# Patient Record
Sex: Female | Born: 1990 | Race: Black or African American | Hispanic: No | Marital: Single | Smoking: Former smoker
Health system: Southern US, Community
[De-identification: ages and names within clinical notes are randomized; demographics above are authoritative.]

## PROBLEM LIST (undated history)

## (undated) ENCOUNTER — Inpatient Hospital Stay (HOSPITAL_COMMUNITY): Payer: Self-pay

## (undated) ENCOUNTER — Emergency Department (HOSPITAL_COMMUNITY): Admission: EM | Discharge status: Absent without leave | Payer: Self-pay

## (undated) DIAGNOSIS — D573 Sickle-cell trait: Secondary | ICD-10-CM

## (undated) DIAGNOSIS — Z789 Other specified health status: Secondary | ICD-10-CM

## (undated) DIAGNOSIS — J302 Other seasonal allergic rhinitis: Secondary | ICD-10-CM

## (undated) HISTORY — PX: WISDOM TOOTH EXTRACTION: SHX21

---
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
## 2012-04-27 ENCOUNTER — Encounter (HOSPITAL_COMMUNITY): Payer: Self-pay | Admitting: *Deleted

## 2012-04-27 ENCOUNTER — Emergency Department (HOSPITAL_COMMUNITY)
Admission: EM | Admit: 2012-04-27 | Discharge: 2012-04-27 | Disposition: A | Discharge status: Home / Self Care | Payer: Self-pay | Attending: Emergency Medicine | Admitting: Emergency Medicine

## 2012-04-27 DIAGNOSIS — Z3202 Encounter for pregnancy test, result negative: Secondary | ICD-10-CM | POA: Insufficient documentation

## 2012-04-27 DIAGNOSIS — R531 Weakness: Secondary | ICD-10-CM

## 2012-04-27 DIAGNOSIS — F17200 Nicotine dependence, unspecified, uncomplicated: Secondary | ICD-10-CM | POA: Insufficient documentation

## 2012-04-27 DIAGNOSIS — R5381 Other malaise: Secondary | ICD-10-CM | POA: Insufficient documentation

## 2012-04-27 DIAGNOSIS — R42 Dizziness and giddiness: Secondary | ICD-10-CM | POA: Insufficient documentation

## 2012-04-27 HISTORY — DX: Other seasonal allergic rhinitis: J30.2

## 2012-04-27 LAB — COMPREHENSIVE METABOLIC PANEL
Alkaline Phosphatase: 65 U/L (ref 39–117)
CO2: 24 mEq/L (ref 19–32)
GFR calc Af Amer: >90 mL/min (ref >90)
GFR calc non Af Amer: >90 mL/min (ref >90)
Potassium: 3.8 mEq/L (ref 3.5–5.1)
Total Bilirubin: 0.3 mg/dL (ref 0.3–1.2)
Total Protein: 7.2 g/dL (ref 6.0–8.3)

## 2012-04-27 LAB — CBC WITH DIFFERENTIAL/PLATELET
Basophils Relative: 0 % (ref 0–1)
Eosinophils Absolute: 0.2 10*3/uL (ref 0.0–0.7)
Eosinophils Relative: 5 % (ref 0–5)
HCT: 34.3 % — ABNORMAL LOW (ref 36.0–46.0)
Lymphocytes Relative: 30 % (ref 12–46)
Lymphs Abs: 1.4 10*3/uL (ref 0.7–4.0)
MCHC: 37.6 g/dL — ABNORMAL HIGH (ref 30.0–36.0)
Monocytes Absolute: 0.6 10*3/uL (ref 0.1–1.0)
Monocytes Relative: 13 % — ABNORMAL HIGH (ref 3–12)
Neutro Abs: 2.5 10*3/uL (ref 1.7–7.7)
RBC: 4.33 MIL/uL (ref 3.87–5.11)
RDW: 13.2 % (ref 11.5–15.5)
WBC: 4.7 10*3/uL (ref 4.0–10.5)

## 2012-04-27 LAB — POCT PREGNANCY, URINE: Preg Test, Ur: NEGATIVE

## 2012-04-27 LAB — URINALYSIS, ROUTINE W REFLEX MICROSCOPIC
Hgb urine dipstick: NEGATIVE
Leukocytes, UA: NEGATIVE
Specific Gravity, Urine: 1.026 (ref 1.005–1.030)
Urobilinogen, UA: 0.2 mg/dL (ref 0.0–1.0)

## 2012-04-27 NOTE — ED Provider Notes (Signed)
History     CSN: [id number]  Arrival date & time 04/27/12  1103   First MD Initiated Contact with Patient 04/27/12 1212      Chief Complaint  Patient presents with  . Weakness    (Consider location/radiation/quality/duration/timing/severity/associated sxs/prior treatment) HPI 22 y.o. Female complaining of generalized weakness for several weeks slightly worse.  Feels like she is lazier, sleepy, and doesn't do anything.  Works at [employer] but states she feels lightheaded sometimes and thinks it is because she doesn't drink enough fluids.  Last week at work she was hot and sweating and had to fan herself off.  LMP 3/22 sexually active using condoms g0, preg negative here.  No weight change, no history of thyroid problems, no neck pain or swelling.  Denies chest pain, cough, nausea, vomiting or diarrhea.  Periods are not heavy and no hisotry of anemia.    Past Medical History  Diagnosis Date  . Seasonal allergies     History reviewed. No pertinent past surgical history.  No family history on file.  History  Substance Use Topics  . Smoking status: Current Every Day Smoker -- 1.00 packs/day  . Smokeless tobacco: Not on file     Comment: PT smokes 3 blacks a day  . Alcohol Use: No    OB History   Grav Para Term Preterm Abortions TAB SAB Ect Mult Living                  Review of Systems  All other systems reviewed and are negative.    Allergies  Review of patient's allergies indicates no known allergies.  Home Medications  No current outpatient prescriptions on file.  BP 129/73  Pulse 78  Temp(Src) 98.5 F (36.9 C) (Oral)  Resp 18  SpO2 99%  LMP 04/17/2012  Physical Exam  Nursing note and vitals reviewed. Constitutional: She is oriented to person, place, and time. She appears well-developed and well-nourished.  HENT:  Head: Normocephalic and atraumatic.  Right Ear: External ear normal.  Left Ear: External ear normal.  Nose: Nose normal.  Mouth/Throat:  Oropharynx is clear and moist.  Eyes: Conjunctivae and EOM are normal. Pupils are equal, round, and reactive to light.  Neck: Normal range of motion. Neck supple.  Cardiovascular: Normal rate, regular rhythm, normal heart sounds and intact distal pulses.   Pulmonary/Chest: Effort normal and breath sounds normal.  Abdominal: Soft. Bowel sounds are normal.  Musculoskeletal: Normal range of motion.  Neurological: She is alert and oriented to person, place, and time. She has normal reflexes.  Skin: Skin is warm and dry.  Psychiatric: She has a normal mood and affect. Her behavior is normal. Judgment and thought content normal.    ED Course  Procedures (including critical care time)  Labs Reviewed  CBC WITH DIFFERENTIAL - Abnormal; Notable for the following:    HCT 34.3 (*)    MCHC 37.6 (*)    Monocytes Relative 13 (*)    All other components within normal limits  COMPREHENSIVE METABOLIC PANEL  URINALYSIS, ROUTINE W REFLEX MICROSCOPIC  POCT PREGNANCY, URINE   No results found.   No diagnosis found.  Results for orders placed during the hospital encounter of 04/27/12  CBC WITH DIFFERENTIAL      Result Value Range   WBC 4.7  4.0 - 10.5 K/uL   RBC 4.33  3.87 - 5.11 MIL/uL   Hemoglobin 12.9  12.0 - 15.0 g/dL   HCT 40.9 (*) 81.1 - 91.4 %  MCV 79.2  78.0 - 100.0 fL   MCH 29.8  26.0 - 34.0 pg   MCHC 37.6 (*) 30.0 - 36.0 g/dL   RDW 16.1  09.6 - 04.5 %   Platelets 180  150 - 400 K/uL   Neutrophils Relative 52  43 - 77 %   Neutro Abs 2.5  1.7 - 7.7 K/uL   Lymphocytes Relative 30  12 - 46 %   Lymphs Abs 1.4  0.7 - 4.0 K/uL   Monocytes Relative 13 (*) 3 - 12 %   Monocytes Absolute 0.6  0.1 - 1.0 K/uL   Eosinophils Relative 5  0 - 5 %   Eosinophils Absolute 0.2  0.0 - 0.7 K/uL   Basophils Relative 0  0 - 1 %   Basophils Absolute 0.0  0.0 - 0.1 K/uL  COMPREHENSIVE METABOLIC PANEL      Result Value Range   Sodium 139  135 - 145 mEq/L   Potassium 3.8  3.5 - 5.1 mEq/L   Chloride  106  96 - 112 mEq/L   CO2 24  19 - 32 mEq/L   Glucose, Bld 88  70 - 99 mg/dL   BUN 11  6 - 23 mg/dL   Creatinine, Ser 4.09  0.50 - 1.10 mg/dL   Calcium 9.2  8.4 - 81.1 mg/dL   Total Protein 7.2  6.0 - 8.3 g/dL   Albumin 4.0  3.5 - 5.2 g/dL   AST 17  0 - 37 U/L   ALT 11  0 - 35 U/L   Alkaline Phosphatase 65  39 - 117 U/L   Total Bilirubin 0.3  0.3 - 1.2 mg/dL   GFR calc non Af Amer >90  >90 mL/min   GFR calc Af Amer >90  >90 mL/min  URINALYSIS, ROUTINE W REFLEX MICROSCOPIC      Result Value Range   Color, Urine YELLOW  YELLOW   APPearance CLEAR  CLEAR   Specific Gravity, Urine 1.026  1.005 - 1.030   pH 7.5  5.0 - 8.0   Glucose, UA NEGATIVE  NEGATIVE mg/dL   Hgb urine dipstick NEGATIVE  NEGATIVE   Bilirubin Urine NEGATIVE  NEGATIVE   Ketones, ur NEGATIVE  NEGATIVE mg/dL   Protein, ur NEGATIVE  NEGATIVE mg/dL   Urobilinogen, UA 0.2  0.0 - 1.0 mg/dL   Nitrite NEGATIVE  NEGATIVE   Leukocytes, UA NEGATIVE  NEGATIVE  POCT PREGNANCY, URINE      Result Value Range   Preg Test, Ur NEGATIVE  NEGATIVE     MDM  Discussed with patient general weakness.  No source seen with no anemia, not pregnant, electrolytes norma.  Needs follow up for thyroid studies, tobacco counseling given, and advised referral for family planning.  Requests note for work.        Hilario Quarry, MD 04/27/12 1259

## 2012-04-27 NOTE — ED Notes (Signed)
Pt c/o generalized weakness that started a couple weeks ago. Pt denies n/v/d. Pt reports she did have one episode last week where she became very hot and lightheaded, pt denies LOC, sts it only lasted a few minutes. Pt sts she thinks she is dehydrated. Pt reports she hasn't been drinking much water lately. Pt denies pain. Pt reports she just feels weak and sometimes like her legs are going to give out. Pt reports she feels that she has been sleeping more than normal. Pt in nad, ambulated to room with no issues, skin warm and dry, resp e/u.

## 2012-04-27 NOTE — ED Notes (Signed)
Pt only complaint is weakness.  Feels unable to stand for long periods of time.  LMP 3/30.

## 2012-06-29 ENCOUNTER — Emergency Department (HOSPITAL_COMMUNITY)
Admission: EM | Admit: 2012-06-29 | Discharge: 2012-06-29 | Discharge status: Against Medical Advice | Payer: Medicaid Other | Attending: Emergency Medicine | Admitting: Emergency Medicine

## 2012-06-29 ENCOUNTER — Encounter (HOSPITAL_COMMUNITY): Payer: Self-pay | Admitting: *Deleted

## 2012-06-29 DIAGNOSIS — J309 Allergic rhinitis, unspecified: Secondary | ICD-10-CM | POA: Insufficient documentation

## 2012-06-29 DIAGNOSIS — N898 Other specified noninflammatory disorders of vagina: Secondary | ICD-10-CM | POA: Insufficient documentation

## 2012-06-29 DIAGNOSIS — F17200 Nicotine dependence, unspecified, uncomplicated: Secondary | ICD-10-CM | POA: Insufficient documentation

## 2012-06-29 LAB — URINALYSIS, ROUTINE W REFLEX MICROSCOPIC
Glucose, UA: NEGATIVE mg/dL
Protein, ur: NEGATIVE mg/dL
pH: 8 (ref 5.0–8.0)

## 2012-06-29 LAB — PREGNANCY, URINE: Preg Test, Ur: NEGATIVE

## 2012-06-29 NOTE — ED Notes (Signed)
Pt did not answer x 1 

## 2012-06-29 NOTE — ED Notes (Signed)
Pt did not answer x 3 

## 2012-06-29 NOTE — ED Notes (Signed)
Pt did not answer x 2 

## 2012-06-29 NOTE — ED Notes (Signed)
PT is here with vaginal discharge and cream colored vaginal discharge.  LMP finished on MOnday

## 2012-11-05 ENCOUNTER — Emergency Department (HOSPITAL_COMMUNITY)
Admission: EM | Admit: 2012-11-05 | Discharge: 2012-11-05 | Disposition: A | Discharge status: Home / Self Care | Payer: Medicaid Other | Attending: Emergency Medicine | Admitting: Emergency Medicine

## 2012-11-05 ENCOUNTER — Encounter (HOSPITAL_COMMUNITY): Payer: Self-pay | Admitting: Emergency Medicine

## 2012-11-05 DIAGNOSIS — Z3490 Encounter for supervision of normal pregnancy, unspecified, unspecified trimester: Secondary | ICD-10-CM

## 2012-11-05 DIAGNOSIS — N898 Other specified noninflammatory disorders of vagina: Secondary | ICD-10-CM | POA: Insufficient documentation

## 2012-11-05 DIAGNOSIS — O99330 Smoking (tobacco) complicating pregnancy, unspecified trimester: Secondary | ICD-10-CM | POA: Insufficient documentation

## 2012-11-05 DIAGNOSIS — O9989 Other specified diseases and conditions complicating pregnancy, childbirth and the puerperium: Secondary | ICD-10-CM | POA: Insufficient documentation

## 2012-11-05 LAB — URINE MICROSCOPIC-ADD ON

## 2012-11-05 LAB — WET PREP, GENITAL
Trich, Wet Prep: NONE SEEN
Yeast Wet Prep HPF POC: NONE SEEN

## 2012-11-05 LAB — URINALYSIS, ROUTINE W REFLEX MICROSCOPIC
Hgb urine dipstick: NEGATIVE
Specific Gravity, Urine: 1.031 — ABNORMAL HIGH (ref 1.005–1.030)
Urobilinogen, UA: 1 mg/dL (ref 0.0–1.0)
pH: 7.5 (ref 5.0–8.0)

## 2012-11-05 LAB — HIV ANTIBODY (ROUTINE TESTING W REFLEX): HIV: NONREACTIVE

## 2012-11-05 LAB — POCT PREGNANCY, URINE: Preg Test, Ur: POSITIVE — AB

## 2012-11-05 MED ORDER — LIDOCAINE HCL (PF) 1 % IJ SOLN
INTRAMUSCULAR | Status: AC
  Administered 2012-11-05: 09:00:00
  Filled 2012-11-05: qty 5

## 2012-11-05 MED ORDER — AZITHROMYCIN 250 MG PO TABS
1000.0000 mg | ORAL_TABLET | Freq: Once | ORAL | Status: AC
  Administered 2012-11-05: 1000 mg via ORAL
  Filled 2012-11-05: qty 4

## 2012-11-05 MED ORDER — PRENATAL COMPLETE 14-0.4 MG PO TABS: 1.0000 | ORAL_TABLET | ORAL | Status: DC

## 2012-11-05 MED ORDER — CEFTRIAXONE SODIUM 250 MG IJ SOLR
250.0000 mg | Freq: Once | INTRAMUSCULAR | Status: AC
  Administered 2012-11-05: 250 mg via INTRAMUSCULAR
  Filled 2012-11-05: qty 250

## 2012-11-05 NOTE — ED Notes (Signed)
Urine sample collected and is by the beside.

## 2012-11-05 NOTE — ED Provider Notes (Signed)
CSN: [id number]     Arrival date & time 11/05/12  8295 History   First MD Initiated Contact with Patient 11/05/12 0813     Chief Complaint  Patient presents with  . Vaginal Discharge   (Consider location/radiation/quality/duration/timing/severity/associated sxs/prior Treatment) HPI Complains of vaginal discharge for one month. Denies other symptoms. No pain anywhere. Treated herself with Monistat 7 she completed 5 of 7 doses without change in symptoms. Last normal menstrual period 09/26/2012. No other associated symptoms. Nothing makes symptoms better or worse. Past Medical History  Diagnosis Date  . Seasonal allergies    medical history negative past surgical history negative History reviewed. No pertinent past surgical history. History reviewed. No pertinent family history. History  Substance Use Topics  . Smoking status: Current Every Day Smoker -- 1.00 packs/day  . Smokeless tobacco: Not on file     Comment: PT smokes 3 blacks a day  . Alcohol Use: No   ex-smoker quit in August 2014 no alcohol no drugs OB History   Grav Para Term Preterm Abortions TAB SAB Ect Mult Living                 Review of Systems  Constitutional: Negative.   Gastrointestinal: Negative.   Genitourinary: Positive for vaginal discharge.  Allergic/Immunologic: Negative.     Allergies  Review of patient's allergies indicates no known allergies.  Home Medications   Current Outpatient Rx  Name  Route  Sig  Dispense  Refill  . Phenyleph-CPM-DM-Aspirin (ALKA-SELTZER PLUS COLD & COUGH PO)   Oral   Take 2 tablets by mouth daily as needed (cold symptoms).          BP 113/73  Pulse 93  Temp(Src) 97.5 F (36.4 C) (Oral)  Resp 20  Ht 5\' 7"  (1.702 m)  Wt 128 lb (58.06 kg)  BMI 20.04 kg/m2  SpO2 100%  LMP 09/26/2012 Physical Exam  Nursing note and vitals reviewed. Constitutional: She appears well-developed and well-nourished.  HENT:  Head: Normocephalic and atraumatic.  Eyes: Conjunctivae  are normal. Pupils are equal, round, and reactive to light.  Neck: Neck supple. No tracheal deviation present. No thyromegaly present.  Cardiovascular: Normal rate and regular rhythm.   No murmur heard. Pulmonary/Chest: Effort normal and breath sounds normal.  Abdominal: Soft. Bowel sounds are normal. She exhibits no distension. There is no tenderness.  Genitourinary:  No external lesion. Copious mucousy whitish vaginal discharge. Positive cervical motion tenderness no adnexal masses or tenderness  Musculoskeletal: Normal range of motion. She exhibits no edema and no tenderness.  Neurological: She is alert. Coordination normal.  Skin: Skin is warm and dry. No rash noted.  Psychiatric: She has a normal mood and affect.    ED Course  Procedures (including critical care time) Labs Review Labs Reviewed  GC/CHLAMYDIA PROBE AMP  WET PREP, GENITAL  RPR  HIV ANTIBODY (ROUTINE TESTING)  URINALYSIS, ROUTINE W REFLEX MICROSCOPIC   Imaging Review No results found.  EKG Interpretation   None      Results for orders placed during the hospital encounter of 11/05/12  WET PREP, GENITAL      Result Value Range   Yeast Wet Prep HPF POC NONE SEEN  NONE SEEN   Trich, Wet Prep NONE SEEN  NONE SEEN   Clue Cells Wet Prep HPF POC FEW (*) NONE SEEN   WBC, Wet Prep HPF POC NONE SEEN  NONE SEEN  URINALYSIS, ROUTINE W REFLEX MICROSCOPIC      Result Value Range  Color, Urine YELLOW  YELLOW   APPearance CLOUDY (*) CLEAR   Specific Gravity, Urine 1.031 (*) 1.005 - 1.030   pH 7.5  5.0 - 8.0   Glucose, UA NEGATIVE  NEGATIVE mg/dL   Hgb urine dipstick NEGATIVE  NEGATIVE   Bilirubin Urine SMALL (*) NEGATIVE   Ketones, ur 15 (*) NEGATIVE mg/dL   Protein, ur NEGATIVE  NEGATIVE mg/dL   Urobilinogen, UA 1.0  0.0 - 1.0 mg/dL   Nitrite NEGATIVE  NEGATIVE   Leukocytes, UA SMALL (*) NEGATIVE  URINE MICROSCOPIC-ADD ON      Result Value Range   Squamous Epithelial / LPF MANY (*) RARE   WBC, UA 3-6  <3  WBC/hpf   RBC / HPF 0-2  <3 RBC/hpf   Bacteria, UA FEW (*) RARE   Urine-Other MUCOUS PRESENT    POCT PREGNANCY, URINE      Result Value Range   Preg Test, Ur POSITIVE (*) NEGATIVE   No results found.   MDM  No diagnosis found. Will treat empirically for STDs. Plan safe sex encouraged. RPR, HIV test pending. Referral health Department for prenatal care. Prescription prenatal vitamins. Diagnosis #1 pregnancy #2 cervicitis    Doug Sou, MD 11/05/12 1002

## 2012-11-05 NOTE — ED Notes (Signed)
Patient presents to the ED with c/o vaginal discharge with an odor for approximately 1 and 1/2 months used OTC yeast medications without relief.

## 2012-11-05 NOTE — ED Notes (Signed)
Pending discharge. After Rocephin injection.

## 2012-11-06 LAB — URINE CULTURE: Colony Count: 3000

## 2012-11-07 LAB — GC/CHLAMYDIA PROBE AMP: GC Probe RNA: NEGATIVE

## 2012-12-22 ENCOUNTER — Inpatient Hospital Stay (HOSPITAL_COMMUNITY)
Admission: AD | Admit: 2012-12-22 | Discharge: 2012-12-23 | Disposition: A | Discharge status: Home / Self Care | Payer: Medicaid Other | Source: Ambulatory Visit | Attending: Obstetrics and Gynecology | Admitting: Obstetrics and Gynecology

## 2012-12-22 ENCOUNTER — Encounter (HOSPITAL_COMMUNITY): Payer: Self-pay | Admitting: *Deleted

## 2012-12-22 DIAGNOSIS — R0981 Nasal congestion: Secondary | ICD-10-CM

## 2012-12-22 DIAGNOSIS — J3489 Other specified disorders of nose and nasal sinuses: Secondary | ICD-10-CM | POA: Insufficient documentation

## 2012-12-22 DIAGNOSIS — O99891 Other specified diseases and conditions complicating pregnancy: Secondary | ICD-10-CM

## 2012-12-22 DIAGNOSIS — R51 Headache: Secondary | ICD-10-CM | POA: Insufficient documentation

## 2012-12-22 DIAGNOSIS — O26891 Other specified pregnancy related conditions, first trimester: Secondary | ICD-10-CM

## 2012-12-22 NOTE — MAU Note (Signed)
Pt LMP 09/25/2012, +UPT at [facility], concerned because she is having headaches everyday. Denies bleeding.

## 2012-12-23 ENCOUNTER — Encounter (HOSPITAL_COMMUNITY): Payer: Self-pay | Admitting: *Deleted

## 2012-12-23 DIAGNOSIS — O9989 Other specified diseases and conditions complicating pregnancy, childbirth and the puerperium: Secondary | ICD-10-CM

## 2012-12-23 MED ORDER — BUTALBITAL-APAP-CAFFEINE 50-325-40 MG PO TABS: 1.0000 | ORAL_TABLET | Freq: Four times a day (QID) | ORAL | Status: DC | PRN

## 2012-12-23 MED ORDER — PSEUDOEPHEDRINE HCL 30 MG PO TABS: 30.0000 mg | ORAL_TABLET | ORAL | Status: DC | PRN

## 2012-12-23 MED ORDER — IBUPROFEN 600 MG PO TABS: 600.0000 mg | ORAL_TABLET | Freq: Four times a day (QID) | ORAL | Status: DC | PRN

## 2012-12-23 NOTE — MAU Provider Note (Signed)
Chief Complaint: Headache   First Provider Initiated Contact with Patient 12/23/12 0120      SUBJECTIVE HPI: Cheyenne Phillips is a 22 y.o. G1P0 at [redacted]w[redacted]d by LMP who presents to MAU with daily HA's since early pregnancy. None now. Some relief w/ Tylenol, but stopped taking mid-November due to pt's and family's concerns about medication use in pregnancy. Pain is temporal, 6-8/10, constant when it occurs. Last several hours at a time. Also C/O constant congestion that she attributes to allergies, but has not experienced any relief from multiple allergy meds recommended/Dx'd by provider at [facility] (unsure of names. 2-3 oral meds. One nasal med.)   Unable to identify triggers. No Hx of similar HA's prior to pregnancy. HA's not progressive. Has not tried anything else for HA. Pt is poor historian.   Plans to go to [facility] when pregnancy Medicaid active.    Past Medical History  Diagnosis Date  . Seasonal allergies    OB History  Gravida Para Term Preterm AB SAB TAB Ectopic Multiple Living  1             # Outcome Date GA Lbr Len/2nd Weight Sex Delivery Anes PTL Lv  1 CUR              Past Surgical History  Procedure Laterality Date  . Wisdom tooth extraction     History   Social History  . Marital Status: Single    Spouse Name: N/A    Number of Children: N/A  . Years of Education: N/A   Occupational History  . Not on file.   Social History Main Topics  . Smoking status: Former Smoker -- 1.00 packs/day  . Smokeless tobacco: Not on file     Comment: PT smokes 3 blacks a day  . Alcohol Use: No  . Drug Use: No  . Sexual Activity: Yes   Other Topics Concern  . Not on file   Social History Narrative  . No narrative on file   No current facility-administered medications on file prior to encounter.   No current outpatient prescriptions on file prior to encounter.   No Known Allergies  ROS: Pos for HA, nasal congestion. Minimal rhinorrhea. Neg for vision  changes, for fever, chills, neck stiffness, difficulties w/ speech or gait, sneezing, itchy eyes/nose, photophobia, phonophobia, N/V/D/C, abd pain, vaginal bleeding, vaginal discharge.   OBJECTIVE Blood pressure 123/65, pulse 74, temperature 97.7 F (36.5 C), temperature source Oral, resp. rate 16, height 5\' 6"  (1.676 m), weight 61.78 kg (136 lb 3.2 oz), last menstrual period 09/25/2012. GENERAL: Well-developed, well-nourished female in no acute distress.  HEENT: Normocephalic. Sinuses NT. Sound congested. No rhinorrhea. Normal neck ROM. Mild tenderness.  HEART: normal rate RESP: normal effort ABDOMEN: Soft, non-tender. Gravid, S=D.  EXTREMITIES: Nontender, no edema NEURO: Alert and oriented FHR 140 by doppler.  LAB RESULTS NA  IMAGING NA  MAU COURSE No red flags for emergent condition.   ASSESSMENT 1. Headache in pregnancy, antepartum, first trimester   2. Nasal congestion related to pregnancy    Suspect HA is congestion or tension-related. Congestion may be hormonal from pregnancy.   PLAN Discharge home in stable condition. HA red flags reviewed. Discussed comfort measures, medications in pregnancy. Try treating congestion and tension. May use Fioricet sparingly.      Follow-up Information   Follow up with [facility]. (Start prenatal care)    Specialty:  Obstetrics and Gynecology   Contact information:  [address] [place] [address] [phone]      Follow up with [facility]. (As needed in emergencies)    Contact information:   [address] [id number] [place] [address] [phone]      Call [facility]arefoot, Rubbie Battiest, NP. (for headache work-up)    Specialty:  Internal Medicine   Contact information:   [address] [place] [address] [phone]        Medication List         acetaminophen 325 MG tablet  Commonly known  as:  TYLENOL  Take 325-650 mg by mouth every 6 (six) hours as needed for headache.     butalbital-acetaminophen-caffeine 50-325-40 MG per tablet  Commonly known as:  FIORICET  Take 1-2 tablets by mouth every 6 (six) hours as needed for headache.     ibuprofen 600 MG tablet  Commonly known as:  ADVIL,MOTRIN  Take 1 tablet (600 mg total) by mouth every 6 (six) hours as needed. Do not take after [redacted] weeks gestation.     prenatal multivitamin Tabs tablet  Take 1 tablet by mouth daily at 12 noon.     pseudoephedrine 30 MG tablet  Commonly known as:  SUDAFED  Take 1 tablet (30 mg total) by mouth every 4 (four) hours as needed for congestion.       [place], CNM 12/23/2012  1:55 AM

## 2012-12-26 NOTE — MAU Provider Note (Signed)
Attestation of Attending Supervision of Advanced Practitioner (CNM/NP): Evaluation and management procedures were performed by the Advanced Practitioner under my supervision and collaboration.  I have reviewed the Advanced Practitioner's note and chart, and I agree with the management and plan.  Nysa Sarin 12/26/2012 11:26 AM   

## 2013-01-31 ENCOUNTER — Encounter: Payer: Self-pay | Admitting: Advanced Practice Midwife

## 2013-01-31 ENCOUNTER — Other Ambulatory Visit: Payer: Self-pay | Admitting: Advanced Practice Midwife

## 2013-01-31 ENCOUNTER — Ambulatory Visit (INDEPENDENT_AMBULATORY_CARE_PROVIDER_SITE_OTHER): Payer: Medicaid Other | Admitting: Advanced Practice Midwife

## 2013-01-31 VITALS — BP 110/64 | Temp 97.7°F | Ht 67.0 in | Wt 142.0 lb

## 2013-01-31 DIAGNOSIS — Z3400 Encounter for supervision of normal first pregnancy, unspecified trimester: Secondary | ICD-10-CM

## 2013-01-31 DIAGNOSIS — Z3201 Encounter for pregnancy test, result positive: Secondary | ICD-10-CM

## 2013-01-31 LAB — POCT URINALYSIS DIPSTICK
BILIRUBIN UA: NEGATIVE
Glucose, UA: NEGATIVE
KETONES UA: NEGATIVE
Nitrite, UA: NEGATIVE
RBC UA: NEGATIVE
Spec Grav, UA: 1.015
Urobilinogen, UA: NEGATIVE
pH, UA: 7

## 2013-01-31 LAB — OB RESULTS CONSOLE GC/CHLAMYDIA
Chlamydia: NEGATIVE
Gonorrhea: NEGATIVE

## 2013-01-31 LAB — POCT URINE PREGNANCY: Preg Test, Ur: POSITIVE

## 2013-01-31 NOTE — Progress Notes (Signed)
HR - 90 Pt in office for New OB visit, reports yellow discharge with irritation

## 2013-01-31 NOTE — Progress Notes (Signed)
   Subjective:    Cheyenne Phillips is a G1P0 5257w2d being seen today for her first obstetrical visit.  Her obstetrical history is significant for nothing. Patient does intend to breast feed. Pregnancy history fully reviewed.  Patient reports no complaints.  Uncertain of LMP reports it was an abnormal period. She feels she is further along than what her LMP would establish.  Patient works at [employer] FOC is involved, it is his 3rd child, their 1st together. Denies risk of genetic disease or spinal defects. Family involved and supportive.  Filed Vitals:   01/31/13 1115 01/31/13 1119  BP: 110/64   Temp: 97.7 F (36.5 C)   Height:  5\' 7"  (1.702 m)  Weight: 142 lb (64.411 kg)     HISTORY: OB History  Gravida Para Term Preterm AB SAB TAB Ectopic Multiple Living  1             # Outcome Date GA Lbr Len/2nd Weight Sex Delivery Anes PTL Lv  1 CUR              Past Medical History  Diagnosis Date  . Seasonal allergies    Past Surgical History  Procedure Laterality Date  . Wisdom tooth extraction     Family History  Problem Relation Age of Onset  . Hypertension Maternal Grandmother   . Hyperlipidemia Maternal Grandmother   . Arthritis Maternal Grandmother   . Asthma Maternal Grandmother      Exam    Uterus:     Pelvic Exam:    Perineum: No Hemorrhoids   Vulva: normal   Vagina:  normal mucosa   pH: 4.5   Cervix: absent   Adnexa: normal adnexa   Bony Pelvis: gynecoid  System: Breast:  normal appearance, no masses or tenderness   Skin: normal coloration and turgor, no rashes    Neurologic: oriented, normal   Extremities: normal strength, tone, and muscle mass   HEENT PERRLA   Mouth/Teeth mucous membranes moist, pharynx normal without lesions   Neck supple   Cardiovascular: regular rate and rhythm   Respiratory:  appears well, vitals normal, no respiratory distress, acyanotic, normal RR, ear and throat exam is normal, neck free of mass or lymphadenopathy,  chest clear, no wheezing, crepitations, rhonchi, normal symmetric air entry   Abdomen: soft, non-tender; bowel sounds normal; no masses,  no organomegaly   Urinary: urethral meatus normal      Assessment:    Pregnancy: G1P0 There are no active problems to display for this patient. US for dating pending      Plan:     Initial labs drawn. Prenatal vitamins. Problem list reviewed and updated. Genetic Screening discussed Quad Screen: once EDD established by US.Marland Kitchen.  Ultrasound discussed; fetal survey: requested.  Follow up in 4 weeks. 80% of 60 min visit spent on counseling and coordination of care.     Dorise Gangi 01/31/2013

## 2013-02-01 LAB — OBSTETRIC PANEL
Antibody Screen: NEGATIVE
Basophils Absolute: 0 10*3/uL (ref 0.0–0.1)
Basophils Relative: 0 % (ref 0–1)
Eosinophils Absolute: 0.3 10*3/uL (ref 0.0–0.7)
Eosinophils Relative: 3 % (ref 0–5)
HCT: 33.7 % — ABNORMAL LOW (ref 36.0–46.0)
HEMOGLOBIN: 11.6 g/dL — AB (ref 12.0–15.0)
HEP B S AG: NEGATIVE
LYMPHS PCT: 12 % (ref 12–46)
Lymphs Abs: 1.2 10*3/uL (ref 0.7–4.0)
MCH: 29.8 pg (ref 26.0–34.0)
MCHC: 34.4 g/dL (ref 30.0–36.0)
MCV: 86.6 fL (ref 78.0–100.0)
Monocytes Absolute: 1 10*3/uL (ref 0.1–1.0)
Monocytes Relative: 10 % (ref 3–12)
Neutro Abs: 7.6 10*3/uL (ref 1.7–7.7)
Neutrophils Relative %: 75 % (ref 43–77)
PLATELETS: 169 10*3/uL (ref 150–400)
RBC: 3.89 MIL/uL (ref 3.87–5.11)
RDW: 13.7 % (ref 11.5–15.5)
Rh Type: POSITIVE
Rubella: 1.52 Index — ABNORMAL HIGH (ref <0.90)
WBC: 10.1 10*3/uL (ref 4.0–10.5)

## 2013-02-01 LAB — GC/CHLAMYDIA PROBE AMP
CT Probe RNA: NEGATIVE
GC Probe RNA: NEGATIVE

## 2013-02-01 LAB — VITAMIN D 25 HYDROXY (VIT D DEFICIENCY, FRACTURES): Vit D, 25-Hydroxy: 14 ng/mL — ABNORMAL LOW (ref 30–89)

## 2013-02-01 LAB — HEMOGLOBINOPATHY EVALUATION
HEMOGLOBIN OTHER: 35.5 % — AB
HGB S QUANTITAION: 0 %
Hgb A2 Quant: 2.8 % (ref 2.2–3.2)
Hgb A: 61.7 % — ABNORMAL LOW (ref 96.8–97.8)
Hgb F Quant: 0 % (ref 0.0–2.0)

## 2013-02-01 LAB — CULTURE, OB URINE
COLONY COUNT: NO GROWTH
Organism ID, Bacteria: NO GROWTH

## 2013-02-01 LAB — VARICELLA ZOSTER ANTIBODY, IGG: VARICELLA IGG: 432.1 {index} — AB (ref <135.00)

## 2013-02-01 LAB — WET PREP BY MOLECULAR PROBE
Candida species: POSITIVE — AB
Gardnerella vaginalis: POSITIVE — AB
Trichomonas vaginosis: NEGATIVE

## 2013-02-01 LAB — TSH: TSH: 1.451 u[IU]/mL (ref 0.350–4.500)

## 2013-02-01 LAB — HIV ANTIBODY (ROUTINE TESTING W REFLEX): HIV: NONREACTIVE

## 2013-02-03 ENCOUNTER — Other Ambulatory Visit: Payer: Self-pay | Admitting: Advanced Practice Midwife

## 2013-02-03 DIAGNOSIS — B9689 Other specified bacterial agents as the cause of diseases classified elsewhere: Secondary | ICD-10-CM

## 2013-02-03 DIAGNOSIS — N760 Acute vaginitis: Principal | ICD-10-CM

## 2013-02-03 DIAGNOSIS — B379 Candidiasis, unspecified: Secondary | ICD-10-CM

## 2013-02-03 LAB — HGB ELECTROPHORESIS REFLEXED REPORT
HEMOGLOBIN A - HGBRFX: 56.9 % — AB (ref >96.0)
HEMOGLOBIN A2 - HGBRFX: 3 % (ref 1.8–3.5)
HEMOGLOBIN ELECT C: 40.1 % — AB
Hemoglobin F - HGBRFX: 0 % (ref <2.0)
Sickle Solubility Test - HGBRFX: NEGATIVE

## 2013-02-03 MED ORDER — FLUCONAZOLE 150 MG PO TABS: 150.0000 mg | ORAL_TABLET | Freq: Once | ORAL | Status: DC

## 2013-02-03 MED ORDER — METRONIDAZOLE 500 MG PO TABS: 500.0000 mg | ORAL_TABLET | Freq: Two times a day (BID) | ORAL | Status: DC

## 2013-02-06 ENCOUNTER — Encounter: Payer: Medicaid Other | Admitting: Family Medicine

## 2013-02-07 ENCOUNTER — Encounter: Payer: Self-pay | Admitting: Obstetrics & Gynecology

## 2013-02-07 ENCOUNTER — Ambulatory Visit (INDEPENDENT_AMBULATORY_CARE_PROVIDER_SITE_OTHER): Payer: Medicaid Other

## 2013-02-07 ENCOUNTER — Other Ambulatory Visit: Payer: Self-pay | Admitting: *Deleted

## 2013-02-07 DIAGNOSIS — Z1389 Encounter for screening for other disorder: Secondary | ICD-10-CM

## 2013-02-07 LAB — US OB DETAIL + 14 WK

## 2013-02-28 ENCOUNTER — Ambulatory Visit (INDEPENDENT_AMBULATORY_CARE_PROVIDER_SITE_OTHER): Payer: Medicaid Other | Admitting: Obstetrics

## 2013-02-28 ENCOUNTER — Encounter: Payer: Self-pay | Admitting: Obstetrics

## 2013-02-28 VITALS — BP 119/70 | Temp 98.3°F | Wt 144.0 lb

## 2013-02-28 DIAGNOSIS — Z3400 Encounter for supervision of normal first pregnancy, unspecified trimester: Secondary | ICD-10-CM

## 2013-02-28 MED ORDER — CITRANATAL 90 DHA 90-1 & 300 MG PO MISC: 1.0000 | Freq: Every day | ORAL | Status: DC

## 2013-02-28 MED ORDER — VITAMIN D 600 IU CAPSULE SWOG S0812: 600.0000 [IU] | ORAL_CAPSULE | Freq: Every day | ORAL | Status: DC

## 2013-02-28 NOTE — Progress Notes (Signed)
Pulse 91 Pt is doing well. Pt needs lab results reviewed, Hgb and Vit D. Pt is also needing a Rx for prenatal vitamins.  CitraNatal 90 and Vit D 600mg  sent to pharmacy today.

## 2013-03-14 ENCOUNTER — Other Ambulatory Visit: Payer: Medicaid Other

## 2013-03-14 ENCOUNTER — Encounter: Payer: Medicaid Other | Admitting: Obstetrics

## 2013-03-21 ENCOUNTER — Other Ambulatory Visit: Payer: Medicaid Other

## 2013-03-21 ENCOUNTER — Encounter: Payer: Medicaid Other | Admitting: Obstetrics

## 2013-04-11 ENCOUNTER — Ambulatory Visit (INDEPENDENT_AMBULATORY_CARE_PROVIDER_SITE_OTHER): Payer: Medicaid Other | Admitting: Obstetrics

## 2013-04-11 ENCOUNTER — Other Ambulatory Visit: Payer: Medicaid Other

## 2013-04-11 VITALS — BP 123/72 | Temp 98.3°F | Wt 150.0 lb

## 2013-04-11 DIAGNOSIS — Z3400 Encounter for supervision of normal first pregnancy, unspecified trimester: Secondary | ICD-10-CM

## 2013-04-11 LAB — POCT URINALYSIS DIPSTICK
Bilirubin, UA: NEGATIVE
Blood, UA: NEGATIVE
GLUCOSE UA: NEGATIVE
Ketones, UA: NEGATIVE
NITRITE UA: NEGATIVE
Spec Grav, UA: 1.015
UROBILINOGEN UA: NEGATIVE
pH, UA: 6

## 2013-04-11 LAB — CBC
HCT: 35.1 % — ABNORMAL LOW (ref 36.0–46.0)
Hemoglobin: 12.3 g/dL (ref 12.0–15.0)
MCH: 29.9 pg (ref 26.0–34.0)
MCHC: 35 g/dL (ref 30.0–36.0)
MCV: 85.2 fL (ref 78.0–100.0)
PLATELETS: 166 10*3/uL (ref 150–400)
RBC: 4.12 MIL/uL (ref 3.87–5.11)
RDW: 14.6 % (ref 11.5–15.5)
WBC: 13.1 10*3/uL — ABNORMAL HIGH (ref 4.0–10.5)

## 2013-04-11 NOTE — Progress Notes (Signed)
Pulse 86 Pt is having some increase in lower pelvic pressure.  Pt also states that she is having some d/c. Pt denies irritation and odor. Pt is also having her 2GTT at today's visit.

## 2013-04-12 ENCOUNTER — Encounter: Payer: Self-pay | Admitting: Obstetrics

## 2013-04-12 LAB — GLUCOSE TOLERANCE, 2 HOURS W/ 1HR
GLUCOSE, FASTING: 47 mg/dL — AB (ref 70–99)
GLUCOSE: 99 mg/dL (ref 70–170)
Glucose, 2 hour: 87 mg/dL (ref 70–139)

## 2013-04-12 LAB — RPR

## 2013-04-12 LAB — HIV ANTIBODY (ROUTINE TESTING W REFLEX): HIV: NONREACTIVE

## 2013-04-20 DIAGNOSIS — Z3400 Encounter for supervision of normal first pregnancy, unspecified trimester: Secondary | ICD-10-CM

## 2013-04-25 ENCOUNTER — Encounter: Payer: Medicaid Other | Admitting: Obstetrics

## 2013-04-28 ENCOUNTER — Inpatient Hospital Stay (HOSPITAL_COMMUNITY)
Admission: AD | Admit: 2013-04-28 | Discharge: 2013-04-28 | Disposition: A | Discharge status: Home / Self Care | Payer: Medicaid Other | Source: Ambulatory Visit | Attending: Obstetrics | Admitting: Obstetrics

## 2013-04-28 ENCOUNTER — Encounter (HOSPITAL_COMMUNITY): Payer: Self-pay

## 2013-04-28 DIAGNOSIS — N898 Other specified noninflammatory disorders of vagina: Secondary | ICD-10-CM | POA: Insufficient documentation

## 2013-04-28 DIAGNOSIS — O9989 Other specified diseases and conditions complicating pregnancy, childbirth and the puerperium: Principal | ICD-10-CM

## 2013-04-28 DIAGNOSIS — O99891 Other specified diseases and conditions complicating pregnancy: Secondary | ICD-10-CM | POA: Insufficient documentation

## 2013-04-28 LAB — URINALYSIS, ROUTINE W REFLEX MICROSCOPIC
Bilirubin Urine: NEGATIVE
Glucose, UA: NEGATIVE mg/dL
Ketones, ur: NEGATIVE mg/dL
NITRITE: NEGATIVE
PROTEIN: NEGATIVE mg/dL
Specific Gravity, Urine: 1.02 (ref 1.005–1.030)
UROBILINOGEN UA: 0.2 mg/dL (ref 0.0–1.0)
pH: 7.5 (ref 5.0–8.0)

## 2013-04-28 LAB — URINE MICROSCOPIC-ADD ON

## 2013-04-28 LAB — POCT FERN TEST: POCT Fern Test: NEGATIVE

## 2013-04-28 MED ORDER — FLUCONAZOLE 150 MG PO TABS
150.0000 mg | ORAL_TABLET | Freq: Once | ORAL | Status: AC
  Administered 2013-04-28: 150 mg via ORAL
  Filled 2013-04-28: qty 1

## 2013-04-28 NOTE — MAU Note (Signed)
Patient states she has had a vaginal discharge for about 5 days that makes her panties wet, not wearing a pad at this time. States she has had mild discomfort on and off for 1 1/2 weeks but no pain at this time. Denies bleeding and reports good fetal movement.

## 2013-04-28 NOTE — MAU Provider Note (Signed)
Cheyenne Phillips is a G1P0 @ 283w1d gestation who presents to the MAU with vaginal discharge for the past week. She states her mother told her to come and make she she was not ruptured.  SSE thick white cottage cheese like discharge. No pooling. RN to call Dr. Clearance CootsHarper with results and discuss plan of care.  Patient stable for discharge without further screening at this time.

## 2013-05-01 ENCOUNTER — Encounter: Payer: Medicaid Other | Admitting: Obstetrics

## 2013-05-04 ENCOUNTER — Ambulatory Visit (INDEPENDENT_AMBULATORY_CARE_PROVIDER_SITE_OTHER): Payer: Medicaid Other | Admitting: Obstetrics

## 2013-05-04 ENCOUNTER — Encounter: Payer: Self-pay | Admitting: Obstetrics

## 2013-05-04 VITALS — BP 116/72 | Temp 98.3°F | Wt 155.0 lb

## 2013-05-04 DIAGNOSIS — Z3400 Encounter for supervision of normal first pregnancy, unspecified trimester: Secondary | ICD-10-CM

## 2013-05-04 LAB — POCT URINALYSIS DIPSTICK
Bilirubin, UA: NEGATIVE
Glucose, UA: NEGATIVE
KETONES UA: NEGATIVE
Leukocytes, UA: NEGATIVE
Nitrite, UA: NEGATIVE
Protein, UA: NEGATIVE
SPEC GRAV UA: 1.01
Urobilinogen, UA: NEGATIVE
pH, UA: 8

## 2013-05-04 NOTE — Progress Notes (Signed)
Subjective:    Cheyenne Phillips is a 23 y.o. female being seen today for her obstetrical visit. She is at 653w0d gestation. Patient reports backache, fatigue, nausea and occasional contractions. Patient states she is having lower abdominal and pelvic pain and pressure. Patient states she gets sharp pains in her upper abdomen. Patient states she had some pain in her left breast Monday. Patient states she needs a refill on her Prenatal Vitamin. Patient states she carries the sickle cell trait. Fetal movement: normal.  Menstrual History: OB History   Grav Para Term Preterm Abortions TAB SAB Ect Mult Living   1                Patient's last menstrual period was 09/25/2012.    Menstrual History: OB History   Grav Para Term Preterm Abortions TAB SAB Ect Mult Living   1                Patient's last menstrual period was 09/25/2012.    Past Medical History  Diagnosis Date  . Seasonal allergies     Past Surgical History  Procedure Laterality Date  . Wisdom tooth extraction       (Not in a hospital admission) Allergies  Allergen Reactions  . Other     seasonal    History  Substance Use Topics  . Smoking status: Former Smoker -- 1.00 packs/day  . Smokeless tobacco: Never Used     Comment: PT smokes 3 blacks a day  . Alcohol Use: No    Family History  Problem Relation Age of Onset  . Hypertension Maternal Grandmother   . Hyperlipidemia Maternal Grandmother   . Arthritis Maternal Grandmother   . Asthma Maternal Grandmother      Review of Systems Constitutional: negative for anorexia Gastrointestinal: negative for abdominal pain Genitourinary:negative for vaginal discharge Musculoskeletal:negative for back pain Behavioral/Psych: negative for depression and tobacco use   Objective:    LMP 09/25/2012 FHT: 150 BPM  Uterine Size: size equals dates  Presentations: cephalic  Pelvic Exam:              Dilation: not examined       Effacement:  n/a   Station:  n/a    Consistency: n/a            Position: n/a     Orders Placed This Encounter  Procedures  . Strep B DNA probe  . POCT urinalysis dipstick    Assessment:    Pregnancy 37 and 0/7 weeks   Plan:   Plans for delivery: Vaginal anticipated; labs reviewed; problem list updated Counseling: Consent signed. Infant feeding: plans to breastfeed. Cigarette smoking: quit -date unknown. L&D discussion: symptoms of labor, discussed when to call, discussed what number to call, anesthetic/analgesic options reviewed and delivering clinician:  plans Physician. Postpartum supports and preparation: circumcision discussed and contraception plans discussed. No orders of the defined types were placed in this encounter.   Current Outpatient Prescriptions on File Prior to Visit  Medication Sig Dispense Refill  . calcium-vitamin D (OSCAL WITH D) 500-200 MG-UNIT per tablet Take 1 tablet by mouth daily.      . Prenatal Vit-Fe Fumarate-FA (PRENATAL MULTIVITAMIN) TABS tablet Take 1 tablet by mouth daily at 12 noon.       No current facility-administered medications on file prior to visit.   Follow up in 1 Week.

## 2013-05-06 LAB — STREP B DNA PROBE: STREP GROUP B AG: NEGATIVE

## 2013-05-11 ENCOUNTER — Encounter: Payer: Medicaid Other | Admitting: Obstetrics

## 2013-05-12 ENCOUNTER — Encounter: Payer: Medicaid Other | Admitting: Obstetrics

## 2013-05-17 ENCOUNTER — Ambulatory Visit (INDEPENDENT_AMBULATORY_CARE_PROVIDER_SITE_OTHER): Payer: Medicaid Other | Admitting: Obstetrics

## 2013-05-17 ENCOUNTER — Encounter: Payer: Self-pay | Admitting: Obstetrics

## 2013-05-17 VITALS — BP 113/73 | HR 96 | Temp 97.9°F | Wt 159.0 lb

## 2013-05-17 DIAGNOSIS — Z3400 Encounter for supervision of normal first pregnancy, unspecified trimester: Secondary | ICD-10-CM

## 2013-05-17 LAB — POCT URINALYSIS DIPSTICK
BILIRUBIN UA: NEGATIVE
GLUCOSE UA: NEGATIVE
KETONES UA: NEGATIVE
LEUKOCYTES UA: NEGATIVE
NITRITE UA: NEGATIVE
Protein, UA: NEGATIVE
RBC UA: NEGATIVE
Spec Grav, UA: 1.015
Urobilinogen, UA: NEGATIVE
pH, UA: 6

## 2013-05-17 NOTE — Progress Notes (Signed)
Subjective:    Cheyenne Phillips is a 23 y.o. female being seen today for her obstetrical visit. She is at 6234w6d gestation. Patient reports no complaints. Fetal movement: normal.  Problem List Items Addressed This Visit   None    Visit Diagnoses   Supervision of normal first pregnancy    -  Primary    Relevant Orders       POCT urinalysis dipstick (Completed)      There are no active problems to display for this patient.   Objective:    BP 113/73  Pulse 96  Temp(Src) 97.9 F (36.6 C)  Wt 159 lb (72.122 kg)  LMP 09/25/2012 FHT: 150 BPM  Uterine Size: size equals dates  Presentations: unsure  Pelvic Exam: Deferred                         Assessment:    Pregnancy @ 1934w6d weeks   Plan:   Plans for delivery: Vaginal anticipated; labs reviewed; problem list updated Counseling: Consent signed. Infant feeding: plans to breastfeed. Cigarette smoking: Smokes 3 / day. L&D discussion: symptoms of labor, discussed when to call, discussed what number to call, anesthetic/analgesic options reviewed and delivering clinician:  plans Physician. Postpartum supports and preparation: circumcision discussed and contraception plans discussed.  Follow up in 1 Week.

## 2013-05-21 ENCOUNTER — Inpatient Hospital Stay (HOSPITAL_COMMUNITY): Admission: AD | Admit: 2013-05-21 | Payer: Medicaid Other | Source: Ambulatory Visit | Admitting: Obstetrics

## 2013-05-26 ENCOUNTER — Encounter: Payer: Medicaid Other | Admitting: Obstetrics & Gynecology

## 2013-05-29 ENCOUNTER — Ambulatory Visit (INDEPENDENT_AMBULATORY_CARE_PROVIDER_SITE_OTHER): Payer: Medicaid Other | Admitting: Obstetrics

## 2013-05-29 VITALS — BP 123/70 | HR 85 | Temp 98.3°F | Wt 160.0 lb

## 2013-05-29 DIAGNOSIS — Z3400 Encounter for supervision of normal first pregnancy, unspecified trimester: Secondary | ICD-10-CM

## 2013-05-29 DIAGNOSIS — O480 Post-term pregnancy: Secondary | ICD-10-CM

## 2013-05-30 ENCOUNTER — Inpatient Hospital Stay (HOSPITAL_COMMUNITY): Payer: Medicaid Other | Admitting: Anesthesiology

## 2013-05-30 ENCOUNTER — Encounter (HOSPITAL_COMMUNITY): Payer: Self-pay

## 2013-05-30 ENCOUNTER — Inpatient Hospital Stay (HOSPITAL_COMMUNITY)
Admission: RE | Admit: 2013-05-30 | Discharge: 2013-06-03 | DRG: 766 | Disposition: A | Discharge status: Home / Self Care | Payer: Medicaid Other | Source: Ambulatory Visit | Attending: Obstetrics | Admitting: Obstetrics

## 2013-05-30 ENCOUNTER — Encounter: Payer: Self-pay | Admitting: Obstetrics

## 2013-05-30 ENCOUNTER — Encounter (HOSPITAL_COMMUNITY): Payer: Medicaid Other | Admitting: Anesthesiology

## 2013-05-30 DIAGNOSIS — Z87891 Personal history of nicotine dependence: Secondary | ICD-10-CM

## 2013-05-30 DIAGNOSIS — O480 Post-term pregnancy: Principal | ICD-10-CM | POA: Diagnosis present

## 2013-05-30 DIAGNOSIS — Z98891 History of uterine scar from previous surgery: Secondary | ICD-10-CM

## 2013-05-30 DIAGNOSIS — D582 Other hemoglobinopathies: Secondary | ICD-10-CM

## 2013-05-30 LAB — POCT URINALYSIS DIPSTICK
Blood, UA: NEGATIVE
GLUCOSE UA: NEGATIVE
Ketones, UA: NEGATIVE
NITRITE UA: NEGATIVE
Protein, UA: NEGATIVE
Spec Grav, UA: 1.015
pH, UA: 5

## 2013-05-30 LAB — CBC
HEMATOCRIT: 32.6 % — AB (ref 36.0–46.0)
HEMOGLOBIN: 12 g/dL (ref 12.0–15.0)
MCH: 29.7 pg (ref 26.0–34.0)
MCHC: 36.8 g/dL — AB (ref 30.0–36.0)
MCV: 80.7 fL (ref 78.0–100.0)
Platelets: 147 10*3/uL — ABNORMAL LOW (ref 150–400)
RBC: 4.04 MIL/uL (ref 3.87–5.11)
RDW: 14.1 % (ref 11.5–15.5)
WBC: 8.3 10*3/uL (ref 4.0–10.5)

## 2013-05-30 LAB — RPR

## 2013-05-30 MED ORDER — FENTANYL 2.5 MCG/ML BUPIVACAINE 1/10 % EPIDURAL INFUSION (WH - ANES)
14.0000 mL/h | INTRAMUSCULAR | Status: DC | PRN
  Administered 2013-05-30: 14 mL/h via EPIDURAL

## 2013-05-30 MED ORDER — TERBUTALINE SULFATE 1 MG/ML IJ SOLN: 0.2500 mg | Freq: Once | INTRAMUSCULAR | Status: AC | PRN

## 2013-05-30 MED ORDER — ONDANSETRON HCL 4 MG/2ML IJ SOLN: 4.0000 mg | Freq: Four times a day (QID) | INTRAMUSCULAR | Status: DC | PRN

## 2013-05-30 MED ORDER — LIDOCAINE HCL (PF) 1 % IJ SOLN
INTRAMUSCULAR | Status: DC | PRN
  Administered 2013-05-30 (×2): 5 mL

## 2013-05-30 MED ORDER — EPHEDRINE 5 MG/ML INJ: 10.0000 mg | INTRAVENOUS | Status: DC | PRN

## 2013-05-30 MED ORDER — PHENYLEPHRINE 40 MCG/ML (10ML) SYRINGE FOR IV PUSH (FOR BLOOD PRESSURE SUPPORT): 80.0000 ug | PREFILLED_SYRINGE | INTRAVENOUS | Status: DC | PRN

## 2013-05-30 MED ORDER — PHENYLEPHRINE 40 MCG/ML (10ML) SYRINGE FOR IV PUSH (FOR BLOOD PRESSURE SUPPORT)
PREFILLED_SYRINGE | INTRAVENOUS | Status: AC
  Filled 2013-05-30: qty 10

## 2013-05-30 MED ORDER — OXYCODONE-ACETAMINOPHEN 5-325 MG PO TABS: 1.0000 | ORAL_TABLET | ORAL | Status: DC | PRN

## 2013-05-30 MED ORDER — LACTATED RINGERS IV SOLN
500.0000 mL | INTRAVENOUS | Status: DC | PRN
  Administered 2013-05-30: 500 mL via INTRAVENOUS
  Administered 2013-05-30: 1000 mL via INTRAVENOUS

## 2013-05-30 MED ORDER — IBUPROFEN 600 MG PO TABS: 600.0000 mg | ORAL_TABLET | Freq: Four times a day (QID) | ORAL | Status: DC | PRN

## 2013-05-30 MED ORDER — EPHEDRINE 5 MG/ML INJ
INTRAVENOUS | Status: AC
  Filled 2013-05-30: qty 4

## 2013-05-30 MED ORDER — OXYTOCIN BOLUS FROM INFUSION: 500.0000 mL | INTRAVENOUS | Status: DC

## 2013-05-30 MED ORDER — ACETAMINOPHEN 325 MG PO TABS: 650.0000 mg | ORAL_TABLET | ORAL | Status: DC | PRN

## 2013-05-30 MED ORDER — LIDOCAINE HCL (PF) 1 % IJ SOLN: 30.0000 mL | INTRAMUSCULAR | Status: DC | PRN

## 2013-05-30 MED ORDER — DIPHENHYDRAMINE HCL 50 MG/ML IJ SOLN: 12.5000 mg | INTRAMUSCULAR | Status: DC | PRN

## 2013-05-30 MED ORDER — LACTATED RINGERS IV SOLN
500.0000 mL | Freq: Once | INTRAVENOUS | Status: AC
Start: 2013-05-30 — End: 2013-05-30
  Administered 2013-05-30: 800 mL via INTRAVENOUS

## 2013-05-30 MED ORDER — CITRIC ACID-SODIUM CITRATE 334-500 MG/5ML PO SOLN
30.0000 mL | ORAL | Status: DC | PRN
  Administered 2013-05-31: 30 mL via ORAL
  Filled 2013-05-30: qty 15

## 2013-05-30 MED ORDER — LACTATED RINGERS IV SOLN
INTRAVENOUS | Status: DC
  Administered 2013-05-30 – 2013-05-31 (×5): via INTRAVENOUS

## 2013-05-30 MED ORDER — OXYTOCIN 40 UNITS IN LACTATED RINGERS INFUSION - SIMPLE MED
1.0000 m[IU]/min | INTRAVENOUS | Status: DC
  Administered 2013-05-30: 2 m[IU]/min via INTRAVENOUS
  Filled 2013-05-30: qty 1000

## 2013-05-30 MED ORDER — MISOPROSTOL 25 MCG QUARTER TABLET
25.0000 ug | ORAL_TABLET | ORAL | Status: DC | PRN
  Administered 2013-05-30: 25 ug via VAGINAL
  Filled 2013-05-30: qty 0.25

## 2013-05-30 MED ORDER — FENTANYL 2.5 MCG/ML BUPIVACAINE 1/10 % EPIDURAL INFUSION (WH - ANES)
INTRAMUSCULAR | Status: AC
  Filled 2013-05-30: qty 125

## 2013-05-30 MED ORDER — OXYTOCIN 40 UNITS IN LACTATED RINGERS INFUSION - SIMPLE MED: 62.5000 mL/h | INTRAVENOUS | Status: DC

## 2013-05-30 NOTE — Progress Notes (Signed)
Cheyenne Phillips is a 23 y.o. G1P0 at 6858w5d by LMP admitted for induction of labor due to Post dates. Due date 05-25-13.  Subjective:   Objective: BP 119/63  Pulse 85  Temp(Src) 98.1 F (36.7 C) (Oral)  Resp 18  Ht 5\' 7"  (1.702 m)  Wt 160 lb (72.576 kg)  BMI 25.05 kg/m2  LMP 09/25/2012      FHT:  FHR: 135 bpm, variability: moderate,  accelerations:  Present,  decelerations:  Present variable UC:   regular, every 2-5 minutes SVE:   Dilation: 3.5 Effacement (%): 70 Station: -2;-3 Exam by:: Paul Dykesina Feir RN  Labs: Lab Results  Component Value Date   WBC 8.3 05/30/2013   HGB 12.0 05/30/2013   HCT 32.6* 05/30/2013   MCV 80.7 05/30/2013   PLT 147* 05/30/2013    Assessment / Plan: Induction of labor due to postdates,  progressing well on pitocin  Labor: Early labor Preeclampsia:  n/a Fetal Wellbeing:  Category I Pain Control:  Epidural I/D:  n/a Anticipated MOD:  NSVD  Brock Badharles A Harper 05/30/2013, 8:06 PM

## 2013-05-30 NOTE — Progress Notes (Signed)
Alma P Sakamoto is a 23 y.o. G1P0 at 7520w5d by ultrasound admitted for induction of labor due to Post dates. Due date 05/25/2013.  Subjective: Patient feeling mild contraction 1/10 in pain. Doing well.  Objective: BP 94/52  Pulse 76  Temp(Src) 98.4 F (36.9 C) (Oral)  Resp 18  Ht 5\' 7"  (1.702 m)  Wt 160 lb (72.576 kg)  BMI 25.05 kg/m2  LMP 09/25/2012      FHT:  FHR: 150 bpm, variability: moderate,  accelerations:  Present,  decelerations:  Absent  Periods of late d decelerations, improved w/ IV hydration and position change. + scalp stimulation. UC:   irregular, every 1.5-5 minutes SVE:   Dilation: 1 Effacement (%): Thick Station: -3 Exam by:: Kirstie PeriAmy Wrenn, CNM  Contraction stress test prior to insertion. 10 min w/ 3 contractions w/ out deceleration, Cat 1 tracing. Foley bulb inserted w/ out difficulty using speculum and stilette. Once cervical bulb in cervical os, manually palpated further insertion w/ catheter for safe insertion. Uterine balloon 40 cc, remained in w/ mild traction. External vaginal balloon 30 cc. Patient tolerated procedure well. Moderate amount of bloody show.   Labs: Lab Results  Component Value Date   WBC 8.3 05/30/2013   HGB 12.0 05/30/2013   HCT 32.6* 05/30/2013   MCV 80.7 05/30/2013   PLT 147* 05/30/2013    Assessment / Plan: Early labor, Cervical Ripening balloon inserted  Labor: Early labor, Cat 2 tracing w/ cytotec. Cytotec d/c'd foley bulb inserted. Preeclampsia:  NA Fetal Wellbeing:  Category I Pain Control:  Labor support without medications I/D:  n/a Anticipated MOD:  NSVD  Taavi Hoose Dessa PhiHowell Shaughnessy Gethers 05/30/2013, 2:23 PM

## 2013-05-30 NOTE — Progress Notes (Addendum)
[facility] UC pattern 2-5 minutes, pitocin at 2 units, about to move patient to exaggerated sims. Clearance CootsHarper reported he reviewed strip, told about lates and variables. Dr. Clearance CootsHarper agrees with exaggerated sims position. Clearance CootsHarper also ordered to keep pitocin at 2-3 millunits.

## 2013-05-30 NOTE — Anesthesia Preprocedure Evaluation (Signed)

## 2013-05-30 NOTE — Progress Notes (Signed)
Cheyenne Phillips is a 23 y.o. G1P0 at 3322w5d by ultrasound admitted for induction of labor due to Post dates. Due date 05/25/2013  Subjective: Patient resting denies painful contractions  Objective: BP 94/52  Pulse 76  Temp(Src) 98.4 F (36.9 C) (Oral)  Resp 18  Ht 5\' 7"  (1.702 m)  Wt 160 lb (72.576 kg)  BMI 25.05 kg/m2  LMP 09/25/2012      FHT:  FHR: 155 bpm, variability: moderate,  accelerations:  Present,  decelerations:  Present occasional declerations, unable to determine early vs late, toco needs adjusted. UC:   irregular, every 2-4 minutes SVE:   Dilation: 1 Effacement (%): Thick Exam by:: Gitty Osterlund wrenn cnm  Labs: Lab Results  Component Value Date   WBC 8.3 05/30/2013   HGB 12.0 05/30/2013   HCT 32.6* 05/30/2013   MCV 80.7 05/30/2013   PLT 147* 05/30/2013    Assessment / Plan: Induction of labor due to postterm,  progressing well on pitocin Cat 2 tracing  IV hydration Change maternal position. No further induction process at this time until Cat 1 FHR. Will consider foley bulb for IOL.   Labor: not in active labor Preeclampsia:  NA Fetal Wellbeing:  Category II Pain Control:  Labor support without medications I/D:  n/a Anticipated MOD:  NSVD  Johnmatthew Solorio Dessa PhiHowell Aveen Stansel 05/30/2013, 12:40 PM

## 2013-05-30 NOTE — Anesthesia Procedure Notes (Signed)
Epidural Patient location during procedure: OB Start time: 05/30/2013 4:42 PM  Staffing Anesthesiologist: Brayton CavesJACKSON, Mazikeen Hehn Performed by: anesthesiologist   Preanesthetic Checklist Completed: patient identified, site marked, surgical consent, pre-op evaluation, timeout performed, IV checked, risks and benefits discussed and monitors and equipment checked  Epidural Patient position: sitting Prep: site prepped and draped and DuraPrep Patient monitoring: continuous pulse ox and blood pressure Approach: midline Location: L3-L4 Injection technique: LOR air  Needle:  Needle type: Tuohy  Needle gauge: 17 G Needle length: 9 cm and 9 Needle insertion depth: 5 cm cm Catheter type: closed end flexible Catheter size: 19 Gauge Catheter at skin depth: 10 cm Test dose: negative  Assessment Events: blood not aspirated, injection not painful, no injection resistance, negative IV test and no paresthesia  Additional Notes Patient identified.  Risk benefits discussed including failed block, incomplete pain control, headache, nerve damage, paralysis, blood pressure changes, nausea, vomiting, reactions to medication both toxic or allergic, and postpartum back pain.  Patient expressed understanding and wished to proceed.  All questions were answered.  Sterile technique used throughout procedure and epidural site dressed with sterile barrier dressing. No paresthesia or other complications noted.The patient did not experience any signs of intravascular injection such as tinnitus or metallic taste in mouth nor signs of intrathecal spread such as rapid motor block. Please see nursing notes for vital signs.

## 2013-05-30 NOTE — Progress Notes (Signed)
Provider reviewed strip. Told Dr. Clearance CootsHarper about continuing lates despite position change and bolus. Reported SVE results. Dr. Clearance CootsHarper will come in to rupture and place [facility].

## 2013-05-30 NOTE — Progress Notes (Signed)
Subjective:    Cheyenne Phillips is a 23 y.o. female being seen today for her obstetrical visit. She is at 846w5d gestation. Patient reports no complaints. Fetal movement: normal.  Problem List Items Addressed This Visit   None    Visit Diagnoses   Supervision of normal first pregnancy    -  Primary    Relevant Orders       POCT urinalysis dipstick      Patient Active Problem List   Diagnosis Date Noted  . Abnormal hemoglobin 05/30/2013  . Post term pregnancy over 40 weeks 05/30/2013    Objective:    BP 123/70  Pulse 85  Temp(Src) 98.3 F (36.8 C)  Wt 160 lb (72.576 kg)  LMP 09/25/2012 FHT:  150 BPM  Uterine Size: size equals dates  Presentation: cephalic  Pelvic Exam:  Cervix closed / 50% / -3 / vertex    Assessment:    Pregnancy @ 486w5d  weeks   Plan:    Postdates management: discussed fetal surveillance and induction, discussed fetal movement, NST reactive, biophysical profile ordered. Induction: scheduled for 05-30-13, written information given.

## 2013-05-30 NOTE — H&P (Signed)
Cheyenne Phillips is a 23 y.o. female presenting for induction of labor dated by 24 weeks US.  History OB History   Grav Para Term Preterm Abortions TAB SAB Ect Mult Living   1              Past Medical History  Diagnosis Date  . Seasonal allergies    Past Surgical History  Procedure Laterality Date  . Wisdom tooth extraction     Family History: family history includes Arthritis in her maternal grandmother; Asthma in her maternal grandmother; Hyperlipidemia in her maternal grandmother; Hypertension in her maternal grandmother. Social History:  reports that she has quit smoking. She has never used smokeless tobacco. She reports that she does not drink alcohol or use illicit drugs.   Prenatal Transfer Tool  Maternal Diabetes: No Genetic Screening: Declined Maternal Ultrasounds/Referrals: Normal Fetal Ultrasounds or other Referrals:  None Maternal Substance Abuse:  No Significant Maternal Medications:  None Significant Maternal Lab Results:  None Other Comments:  None  Review of Systems  Constitutional: Negative.   HENT: Negative.   Eyes: Negative.   Respiratory: Negative.   Cardiovascular: Negative.   Gastrointestinal: Negative.   Genitourinary: Negative.   Musculoskeletal: Negative.   Skin: Negative.   Neurological: Negative.   Endo/Heme/Allergies: Negative.   Psychiatric/Behavioral: Negative.    T 98.3, HR 85, BP 123/70    Height 5\' 7"  (1.702 m), weight 160 lb (72.576 kg), last menstrual period 09/25/2012. Maternal Exam:  Uterine Assessment: Contraction strength is mild.  Abdomen: Patient reports no abdominal tenderness. Fundal height is 40.   Estimated fetal weight is 3200.   Fetal presentation: vertex  Introitus: Normal vulva. Normal vagina.  Ferning test: not done.  Nitrazine test: not done. Amniotic fluid character: not assessed.  Pelvis: adequate for delivery.      Fetal Exam Fetal Monitor Review: Mode: ultrasound.   Baseline rate: 130.   Variability: moderate (6-25 bpm).   Pattern: accelerations present and no decelerations.    Fetal State Assessment: Category I - tracings are normal.     Physical Exam  Constitutional: She is oriented to person, place, and time. She appears well-developed and well-nourished.  HENT:  Head: Normocephalic.  Eyes: Pupils are equal, round, and reactive to light.  Neck: Normal range of motion.  Cardiovascular: Normal rate and regular rhythm.   Respiratory: Effort normal and breath sounds normal.  GI: Soft.  Genitourinary: Vagina normal and uterus normal.  Musculoskeletal: Normal range of motion.  Neurological: She is alert and oriented to person, place, and time.  Skin: Skin is warm and dry.  Psychiatric: She has a normal mood and affect. Her behavior is normal. Judgment and thought content normal.    Prenatal labs: ABO, Rh: O/POS/-- (01/13 1340) Antibody: NEG (01/13 1340) Rubella: 1.52 (01/13 1340) RPR: NON REAC (03/24 1207)  HBsAg: NEGATIVE (01/13 1340)  HIV: NON REACTIVE (03/24 1207)  GBS: NEGATIVE (04/16 1632)   Assessment/Plan: Admit to L&D Induction of Labor, cytotec Explained induction to patient, reports understanding.  Anticipate NSVD, consult PRN MD Clearance CootsHarper aware of patient status  Abnormal hemoglobin panel w/ NOB labs, hct/hgb stable.   Cheyenne Phillips Cheyenne PhiHowell Cheyenne Phillips 05/30/2013, 8:36 AM

## 2013-05-31 ENCOUNTER — Encounter (HOSPITAL_COMMUNITY): Payer: Self-pay

## 2013-05-31 ENCOUNTER — Encounter (HOSPITAL_COMMUNITY)
Admission: RE | Disposition: A | Discharge status: Home / Self Care | Payer: Self-pay | Source: Ambulatory Visit | Attending: Obstetrics

## 2013-05-31 DIAGNOSIS — Z98891 History of uterine scar from previous surgery: Secondary | ICD-10-CM

## 2013-05-31 LAB — CBC
HEMATOCRIT: 31.6 % — AB (ref 36.0–46.0)
HEMATOCRIT: 30.8 % — AB (ref 36.0–46.0)
HEMOGLOBIN: 11.2 g/dL — AB (ref 12.0–15.0)
Hemoglobin: 11.5 g/dL — ABNORMAL LOW (ref 12.0–15.0)
MCH: 29.6 pg (ref 26.0–34.0)
MCH: 29.2 pg (ref 26.0–34.0)
MCHC: 36.4 g/dL — ABNORMAL HIGH (ref 30.0–36.0)
MCHC: 36.4 g/dL — ABNORMAL HIGH (ref 30.0–36.0)
MCV: 81.2 fL (ref 78.0–100.0)
MCV: 80.4 fL (ref 78.0–100.0)
PLATELETS: 126 10*3/uL — AB (ref 150–400)
Platelets: 129 10*3/uL — ABNORMAL LOW (ref 150–400)
RBC: 3.89 MIL/uL (ref 3.87–5.11)
RBC: 3.83 MIL/uL — ABNORMAL LOW (ref 3.87–5.11)
RDW: 14.2 % (ref 11.5–15.5)
RDW: 14.2 % (ref 11.5–15.5)
WBC: 10 10*3/uL (ref 4.0–10.5)
WBC: 11 10*3/uL — AB (ref 4.0–10.5)

## 2013-05-31 SURGERY — Surgical Case
Anesthesia: Epidural

## 2013-05-31 MED ORDER — KETOROLAC TROMETHAMINE 30 MG/ML IJ SOLN: 30.0000 mg | Freq: Four times a day (QID) | INTRAMUSCULAR | Status: DC | PRN

## 2013-05-31 MED ORDER — DIPHENHYDRAMINE HCL 25 MG PO CAPS: 25.0000 mg | ORAL_CAPSULE | Freq: Four times a day (QID) | ORAL | Status: DC | PRN

## 2013-05-31 MED ORDER — FENTANYL CITRATE 0.05 MG/ML IJ SOLN
25.0000 ug | INTRAMUSCULAR | Status: DC | PRN
  Administered 2013-05-31: 50 ug via INTRAVENOUS

## 2013-05-31 MED ORDER — MEPERIDINE HCL 25 MG/ML IJ SOLN
INTRAMUSCULAR | Status: DC | PRN
  Administered 2013-05-31 (×2): 12.5 mg via INTRAVENOUS

## 2013-05-31 MED ORDER — PRENATAL MULTIVITAMIN CH
1.0000 | ORAL_TABLET | Freq: Every day | ORAL | Status: DC
  Administered 2013-05-31 – 2013-06-02 (×3): 1 via ORAL
  Filled 2013-05-31 (×3): qty 1

## 2013-05-31 MED ORDER — MORPHINE SULFATE (PF) 0.5 MG/ML IJ SOLN
INTRAMUSCULAR | Status: DC | PRN
  Administered 2013-05-31: 100 ug via INTRAVENOUS
  Administered 2013-05-31: 400 ug via EPIDURAL

## 2013-05-31 MED ORDER — SIMETHICONE 80 MG PO CHEW
80.0000 mg | CHEWABLE_TABLET | Freq: Three times a day (TID) | ORAL | Status: DC
  Administered 2013-05-31 – 2013-06-03 (×10): 80 mg via ORAL
  Filled 2013-05-31 (×10): qty 1

## 2013-05-31 MED ORDER — LACTATED RINGERS IV SOLN
INTRAVENOUS | Status: DC
Start: 2013-05-31 — End: 2013-06-03
  Administered 2013-05-31 (×2): via INTRAVENOUS

## 2013-05-31 MED ORDER — PHENYLEPHRINE 8 MG IN D5W 100 ML (0.08MG/ML) PREMIX OPTIME
INJECTION | INTRAVENOUS | Status: AC
  Filled 2013-05-31: qty 100

## 2013-05-31 MED ORDER — HYDROMORPHONE HCL PF 1 MG/ML IJ SOLN
0.5000 mg | Freq: Once | INTRAMUSCULAR | Status: AC
  Administered 2013-05-31: 2 mg via INTRAVENOUS
  Filled 2013-05-31: qty 2

## 2013-05-31 MED ORDER — SIMETHICONE 80 MG PO CHEW
80.0000 mg | CHEWABLE_TABLET | ORAL | Status: DC
  Administered 2013-05-31 – 2013-06-02 (×3): 80 mg via ORAL
  Filled 2013-05-31 (×3): qty 1

## 2013-05-31 MED ORDER — DIPHENHYDRAMINE HCL 50 MG/ML IJ SOLN: 25.0000 mg | INTRAMUSCULAR | Status: DC | PRN

## 2013-05-31 MED ORDER — MENTHOL 3 MG MT LOZG: 1.0000 | LOZENGE | OROMUCOSAL | Status: DC | PRN

## 2013-05-31 MED ORDER — KETOROLAC TROMETHAMINE 30 MG/ML IJ SOLN
30.0000 mg | Freq: Four times a day (QID) | INTRAMUSCULAR | Status: DC | PRN
  Administered 2013-05-31: 30 mg via INTRAVENOUS

## 2013-05-31 MED ORDER — METOCLOPRAMIDE HCL 5 MG/ML IJ SOLN: 10.0000 mg | Freq: Three times a day (TID) | INTRAMUSCULAR | Status: DC | PRN

## 2013-05-31 MED ORDER — ONDANSETRON HCL 4 MG/2ML IJ SOLN
INTRAMUSCULAR | Status: AC
  Filled 2013-05-31: qty 2

## 2013-05-31 MED ORDER — NALOXONE HCL 1 MG/ML IJ SOLN
1.0000 ug/kg/h | INTRAVENOUS | Status: DC | PRN
  Filled 2013-05-31: qty 2

## 2013-05-31 MED ORDER — NALBUPHINE HCL 10 MG/ML IJ SOLN
5.0000 mg | INTRAMUSCULAR | Status: DC | PRN
  Administered 2013-05-31: 10 mg via INTRAVENOUS
  Filled 2013-05-31: qty 1

## 2013-05-31 MED ORDER — SCOPOLAMINE 1 MG/3DAYS TD PT72
1.0000 | MEDICATED_PATCH | Freq: Once | TRANSDERMAL | Status: AC
  Administered 2013-05-31: 1.5 mg via TRANSDERMAL

## 2013-05-31 MED ORDER — LIDOCAINE-EPINEPHRINE (PF) 2 %-1:200000 IJ SOLN
INTRAMUSCULAR | Status: AC
  Filled 2013-05-31: qty 20

## 2013-05-31 MED ORDER — MEPERIDINE HCL 25 MG/ML IJ SOLN: 6.2500 mg | INTRAMUSCULAR | Status: DC | PRN

## 2013-05-31 MED ORDER — ONDANSETRON HCL 4 MG/2ML IJ SOLN
INTRAMUSCULAR | Status: DC | PRN
  Administered 2013-05-31: 4 mg via INTRAVENOUS

## 2013-05-31 MED ORDER — PROMETHAZINE HCL 25 MG/ML IJ SOLN: 6.2500 mg | INTRAMUSCULAR | Status: DC | PRN

## 2013-05-31 MED ORDER — DIBUCAINE 1 % RE OINT: 1.0000 "application " | TOPICAL_OINTMENT | RECTAL | Status: DC | PRN

## 2013-05-31 MED ORDER — WITCH HAZEL-GLYCERIN EX PADS: 1.0000 "application " | MEDICATED_PAD | CUTANEOUS | Status: DC | PRN

## 2013-05-31 MED ORDER — DIPHENHYDRAMINE HCL 25 MG PO CAPS: 25.0000 mg | ORAL_CAPSULE | ORAL | Status: DC | PRN

## 2013-05-31 MED ORDER — MEDROXYPROGESTERONE ACETATE 150 MG/ML IM SUSP: 150.0000 mg | INTRAMUSCULAR | Status: DC | PRN

## 2013-05-31 MED ORDER — TETANUS-DIPHTH-ACELL PERTUSSIS 5-2.5-18.5 LF-MCG/0.5 IM SUSP
0.5000 mL | Freq: Once | INTRAMUSCULAR | Status: AC
  Administered 2013-06-01: 0.5 mL via INTRAMUSCULAR
  Filled 2013-05-31: qty 0.5

## 2013-05-31 MED ORDER — FENTANYL CITRATE 0.05 MG/ML IJ SOLN
INTRAMUSCULAR | Status: AC
  Filled 2013-05-31: qty 2

## 2013-05-31 MED ORDER — SODIUM BICARBONATE 8.4 % IV SOLN
INTRAVENOUS | Status: AC
  Filled 2013-05-31: qty 50

## 2013-05-31 MED ORDER — ONDANSETRON HCL 4 MG PO TABS: 4.0000 mg | ORAL_TABLET | ORAL | Status: DC | PRN

## 2013-05-31 MED ORDER — KETOROLAC TROMETHAMINE 30 MG/ML IJ SOLN
INTRAMUSCULAR | Status: AC
  Filled 2013-05-31: qty 1

## 2013-05-31 MED ORDER — SIMETHICONE 80 MG PO CHEW: 80.0000 mg | CHEWABLE_TABLET | ORAL | Status: DC | PRN

## 2013-05-31 MED ORDER — IBUPROFEN 600 MG PO TABS
600.0000 mg | ORAL_TABLET | Freq: Four times a day (QID) | ORAL | Status: DC
  Administered 2013-05-31 – 2013-06-03 (×12): 600 mg via ORAL
  Filled 2013-05-31 (×12): qty 1

## 2013-05-31 MED ORDER — ZOLPIDEM TARTRATE 5 MG PO TABS: 5.0000 mg | ORAL_TABLET | Freq: Every evening | ORAL | Status: DC | PRN

## 2013-05-31 MED ORDER — LANOLIN HYDROUS EX OINT: 1.0000 "application " | TOPICAL_OINTMENT | CUTANEOUS | Status: DC | PRN

## 2013-05-31 MED ORDER — SENNOSIDES-DOCUSATE SODIUM 8.6-50 MG PO TABS
2.0000 | ORAL_TABLET | ORAL | Status: DC
  Administered 2013-05-31 – 2013-06-02 (×3): 2 via ORAL
  Filled 2013-05-31 (×3): qty 2

## 2013-05-31 MED ORDER — MEPERIDINE HCL 25 MG/ML IJ SOLN
INTRAMUSCULAR | Status: AC
  Filled 2013-05-31: qty 1

## 2013-05-31 MED ORDER — ONDANSETRON HCL 4 MG/2ML IJ SOLN: 4.0000 mg | INTRAMUSCULAR | Status: DC | PRN

## 2013-05-31 MED ORDER — ONDANSETRON HCL 4 MG/2ML IJ SOLN: 4.0000 mg | Freq: Three times a day (TID) | INTRAMUSCULAR | Status: DC | PRN

## 2013-05-31 MED ORDER — PNEUMOCOCCAL VAC POLYVALENT 25 MCG/0.5ML IJ INJ
0.5000 mL | INJECTION | INTRAMUSCULAR | Status: AC
  Administered 2013-06-01: 0.5 mL via INTRAMUSCULAR
  Filled 2013-05-31: qty 0.5

## 2013-05-31 MED ORDER — NALBUPHINE HCL 10 MG/ML IJ SOLN: 5.0000 mg | INTRAMUSCULAR | Status: DC | PRN

## 2013-05-31 MED ORDER — CEFAZOLIN SODIUM-DEXTROSE 2-3 GM-% IV SOLR
INTRAVENOUS | Status: AC
  Filled 2013-05-31: qty 50

## 2013-05-31 MED ORDER — LACTATED RINGERS IV SOLN
40.0000 [IU] | INTRAVENOUS | Status: DC | PRN
  Administered 2013-05-31: 40 [IU] via INTRAVENOUS

## 2013-05-31 MED ORDER — SODIUM BICARBONATE 8.4 % IV SOLN
INTRAVENOUS | Status: DC | PRN
  Administered 2013-05-31 (×2): 5 mL via EPIDURAL

## 2013-05-31 MED ORDER — SCOPOLAMINE 1 MG/3DAYS TD PT72
MEDICATED_PATCH | TRANSDERMAL | Status: AC
  Filled 2013-05-31: qty 1

## 2013-05-31 MED ORDER — MORPHINE SULFATE 0.5 MG/ML IJ SOLN
INTRAMUSCULAR | Status: AC
  Filled 2013-05-31: qty 10

## 2013-05-31 MED ORDER — SODIUM CHLORIDE 0.9 % IJ SOLN: 3.0000 mL | INTRAMUSCULAR | Status: DC | PRN

## 2013-05-31 MED ORDER — OXYCODONE-ACETAMINOPHEN 5-325 MG PO TABS
1.0000 | ORAL_TABLET | ORAL | Status: DC | PRN
  Administered 2013-05-31 – 2013-06-03 (×9): 2 via ORAL
  Filled 2013-05-31 (×9): qty 2

## 2013-05-31 MED ORDER — NALOXONE HCL 0.4 MG/ML IJ SOLN: 0.4000 mg | INTRAMUSCULAR | Status: DC | PRN

## 2013-05-31 MED ORDER — CEFAZOLIN SODIUM-DEXTROSE 2-3 GM-% IV SOLR
INTRAVENOUS | Status: DC | PRN
  Administered 2013-05-31: 2 g via INTRAVENOUS

## 2013-05-31 MED ORDER — OXYTOCIN 10 UNIT/ML IJ SOLN
INTRAMUSCULAR | Status: AC
  Filled 2013-05-31: qty 4

## 2013-05-31 MED ORDER — OXYTOCIN 40 UNITS IN LACTATED RINGERS INFUSION - SIMPLE MED: 62.5000 mL/h | INTRAVENOUS | Status: AC

## 2013-05-31 MED ORDER — DIPHENHYDRAMINE HCL 50 MG/ML IJ SOLN: 12.5000 mg | INTRAMUSCULAR | Status: DC | PRN

## 2013-05-31 SURGICAL SUPPLY — 42 items
ADH SKN CLS APL DERMABOND .7 (GAUZE/BANDAGES/DRESSINGS) ×1
CANISTER WOUND CARE 500ML ATS (WOUND CARE) IMPLANT
CLAMP CORD UMBIL (MISCELLANEOUS) ×1 IMPLANT
CLOTH BEACON ORANGE TIMEOUT ST (SAFETY) ×2 IMPLANT
CONTAINER PREFILL 10% NBF 15ML (MISCELLANEOUS) ×4 IMPLANT
DERMABOND ADVANCED (GAUZE/BANDAGES/DRESSINGS) ×1
DERMABOND ADVANCED .7 DNX12 (GAUZE/BANDAGES/DRESSINGS) ×1 IMPLANT
DRAPE LG THREE QUARTER DISP (DRAPES) ×1 IMPLANT
DRSG OPSITE POSTOP 4X10 (GAUZE/BANDAGES/DRESSINGS) ×2 IMPLANT
DRSG VAC ATS LRG SENSATRAC (GAUZE/BANDAGES/DRESSINGS) IMPLANT
DRSG VAC ATS MED SENSATRAC (GAUZE/BANDAGES/DRESSINGS) IMPLANT
DRSG VAC ATS SM SENSATRAC (GAUZE/BANDAGES/DRESSINGS) IMPLANT
DURAPREP 26ML APPLICATOR (WOUND CARE) ×2 IMPLANT
ELECT REM PT RETURN 9FT ADLT (ELECTROSURGICAL) ×2
ELECTRODE REM PT RTRN 9FT ADLT (ELECTROSURGICAL) ×1 IMPLANT
EXTRACTOR VACUUM M CUP 4 TUBE (SUCTIONS) ×1 IMPLANT
GLOVE BIO SURGEON STRL SZ8 (GLOVE) ×4 IMPLANT
GOWN STRL REUS W/TWL LRG LVL3 (GOWN DISPOSABLE) ×4 IMPLANT
KIT ABG SYR 3ML LUER SLIP (SYRINGE) IMPLANT
NDL HYPO 25X5/8 SAFETYGLIDE (NEEDLE) ×1 IMPLANT
NEEDLE HYPO 25X5/8 SAFETYGLIDE (NEEDLE) ×2 IMPLANT
NS IRRIG 1000ML POUR BTL (IV SOLUTION) ×2 IMPLANT
PACK C SECTION WH (CUSTOM PROCEDURE TRAY) ×2 IMPLANT
PAD OB MATERNITY 4.3X12.25 (PERSONAL CARE ITEMS) ×2 IMPLANT
RTRCTR C-SECT PINK 25CM LRG (MISCELLANEOUS) ×2 IMPLANT
STAPLER VISISTAT 35W (STAPLE) ×1 IMPLANT
SUT GUT PLAIN 0 CT-3 TAN 27 (SUTURE) IMPLANT
SUT MNCRL 0 VIOLET CTX 36 (SUTURE) ×3 IMPLANT
SUT MNCRL AB 4-0 PS2 18 (SUTURE) IMPLANT
SUT MON AB 2-0 CT1 27 (SUTURE) ×2 IMPLANT
SUT MON AB 3-0 SH 27 (SUTURE)
SUT MON AB 3-0 SH27 (SUTURE) IMPLANT
SUT MONOCRYL 0 CTX 36 (SUTURE) ×3
SUT PDS AB 0 CTX 60 (SUTURE) IMPLANT
SUT PLAIN 2 0 XLH (SUTURE) IMPLANT
SUT VIC AB 0 CTX 36 (SUTURE) ×4
SUT VIC AB 0 CTX36XBRD ANBCTRL (SUTURE) IMPLANT
SUT VIC AB 2-0 CT1 27 (SUTURE) ×2
SUT VIC AB 2-0 CT1 TAPERPNT 27 (SUTURE) IMPLANT
TOWEL OR 17X24 6PK STRL BLUE (TOWEL DISPOSABLE) ×2 IMPLANT
TRAY FOLEY CATH 14FR (SET/KITS/TRAYS/PACK) ×2 IMPLANT
WATER STERILE IRR 1000ML POUR (IV SOLUTION) ×1 IMPLANT

## 2013-05-31 NOTE — Progress Notes (Signed)
Subjective: Postpartum Day 0: Cesarean Delivery Patient reports tolerating PO.    Objective: Vital signs in last 24 hours: Temp:  [97.4 F (36.3 C)-98.4 F (36.9 C)] 98.3 F (36.8 C) (05/13 0637) Pulse Rate:  [59-157] 79 (05/13 0637) Resp:  [14-27] 18 (05/13 0637) BP: (84-131)/(39-103) 109/79 mmHg (05/13 0637) SpO2:  [96 %-100 %] 98 % (05/13 09810637)  Physical Exam:  General: alert and no distress Lochia: appropriate Uterine Fundus: firm Incision: healing well DVT Evaluation: No evidence of DVT seen on physical exam.   Recent Labs  05/31/13 0155 05/31/13 0602  HGB 11.5* 11.2*  HCT 31.6* 30.8*    Assessment/Plan: Status post Cesarean section. Doing well postoperatively.  Continue current care.  Cheyenne Phillips 05/31/2013, 8:11 AM

## 2013-05-31 NOTE — Op Note (Signed)
Cesarean Section Procedure Note   Cheyenne Phillips   05/30/2013 - 05/31/2013  Indications: Repetitive severe late FHR decelerations, moderate meconium   Pre-operative Diagnosis: non reassuring fetal heart rate.  Repetitive severe late FHR decelerations, moderate meconium  Post-operative Diagnosis: Same   Surgeon: Brock Badharles A Harper  Assistants: Surgical Technician  Anesthesia: epidural  Procedure Details:  The patient was seen in the Holding Room. The risks, benefits, complications, treatment options, and expected outcomes were discussed with the patient. The patient concurred with the proposed plan, giving informed consent. The patient was identified as [name] and the procedure verified as C-Section Delivery. A Time Out was held and the above information confirmed.  After induction of anesthesia, the patient was draped and prepped in the usual sterile manner. A transverse incision was made and carried down through the subcutaneous tissue to the fascia. The fascial incision was made and extended transversely. The fascia was separated from the underlying rectus tissue superiorly and inferiorly. The peritoneum was identified and entered. The peritoneal incision was extended longitudinally. The utero-vesical peritoneal reflection was incised transversely and the bladder flap was bluntly freed from the lower uterine segment. A low transverse uterine incision was made. Delivered from cephalic presentation was a 3084 gram living newborn female infant(s). APGAR (1 MIN): 8   APGAR (5 MINS): 9   APGAR (10 MINS):    A cord ph was sent = ( 7.1 ) .  The umbilical cord was clamped and cut cord. A sample was obtained for evaluation. The placenta was removed Intact and appeared normal.  The uterine incision was closed with running locked sutures of 1-0 Monocryl. A second imbricating layer of the same suture was placed.  Hemostasis was observed. The paracolic gutters were irrigated. The parieto  peritoneum was closed in a running fashion with 2-0 Vicryl.  The fascia was then reapproximated with running sutures of 0 Vicryl.  The skin was closed with staples.  Instrument, sponge, and needle counts were correct prior the abdominal closure and were correct at the conclusion of the case.    Findings:   Estimated Blood Loss:  800ml  Total IV Fluids: 1200ml   Urine Output: 40CC OF clear urine  Specimens:  Placenta to pathology  Complications: no complications  Disposition: PACU - hemodynamically stable.  Maternal Condition: stable   Baby condition / location:  Couplet care / Skin to Skin    Signed: Surgeon(s): Brock Badharles A Harper, MD

## 2013-05-31 NOTE — Transfer of Care (Signed)
Immediate Anesthesia Transfer of Care Note  Patient: Cheyenne Phillips  Procedure(s) Performed: Procedure(s): CESAREAN SECTION (N/A)  Patient Location: PACU  Anesthesia Type:Epidural  Level of Consciousness: awake, alert  and oriented  Airway & Oxygen Therapy: Patient Spontanous Breathing  Post-op Assessment: Report given to PACU RN and Post -op Vital signs reviewed and stable  Post vital signs: Reviewed and stable  Complications: No apparent anesthesia complications

## 2013-05-31 NOTE — Progress Notes (Signed)
UR chart review completed.  

## 2013-05-31 NOTE — Progress Notes (Signed)
Cheyenne Phillips is a 23 y.o. G1P0 at 2131w6d by LMP admitted for induction of labor due to Post dates. Due date 5-7- 15.  Subjective:   Objective: BP 98/56  Pulse 65  Temp(Src) 97.4 F (36.3 C) (Oral)  Resp 16  Ht 5\' 7"  (1.702 m)  Wt 160 lb (72.576 kg)  BMI 25.05 kg/m2  LMP 09/25/2012   Total I/O In: -  Out: 900 [Urine:900]  FHT:  FHR: 140-150 bpm, variability: moderate,  accelerations:  Present,  decelerations:  Present Late UC:   regular, every 2-3 minutes SVE:   Dilation: 3.5 Effacement (%): 80 Station: -1;-2 Exam by:: nicole licato rn  Labs: Lab Results  Component Value Date   WBC 8.3 05/30/2013   HGB 12.0 05/30/2013   HCT 32.6* 05/30/2013   MCV 80.7 05/30/2013   PLT 147* 05/30/2013    Assessment / Plan: Postdates.  IOL.  Latent phase.  Thick meconium.  Persistent severe late FHR decels down to 50 bpm and lasting 3 minutes.  Will proceed with C/S for persistent severe late decels and thick meconium, in latent phase of labor.  Labor: Latent phase Preeclampsia:  n/a Fetal Wellbeing:  Category III Pain Control:  Epidural I/D:  n/a Anticipated MOD:  Cesarean Section  Brock Badharles A Harper 05/31/2013, 12:22 AM

## 2013-05-31 NOTE — Anesthesia Postprocedure Evaluation (Signed)
  Anesthesia Post-op Note  Patient: Cheyenne Phillips  Procedure(s) Performed: Procedure(s): CESAREAN SECTION (N/A)  Patient Location: Mother/Baby  Anesthesia Type:Epidural  Level of Consciousness: awake  Airway and Oxygen Therapy: Patient Spontanous Breathing  Post-op Pain: mild  Post-op Assessment: Patient's Cardiovascular Status Stable and Respiratory Function Stable  Post-op Vital Signs: stable  Last Vitals:  Filed Vitals:   05/31/13 0830  BP: 116/74  Pulse: 68  Temp: 37.2 C  Resp: 18    Complications: No apparent anesthesia complications

## 2013-05-31 NOTE — Addendum Note (Signed)
Addendum created 05/31/13 0917 by Renford DillsJanet L Nealy Hickmon, CRNA   Modules edited: Notes Section   Notes Section:  File: [id number]

## 2013-05-31 NOTE — Anesthesia Postprocedure Evaluation (Signed)
  Anesthesia Post-op Note  Patient: Cheyenne Phillips  Procedure(s) Performed: Procedure(s): CESAREAN SECTION (N/A)  Patient Location: PACU  Anesthesia Type:Epidural  Level of Consciousness: awake and alert   Airway and Oxygen Therapy: Patient Spontanous Breathing  Post-op Pain: none  Post-op Assessment: Post-op Vital signs reviewed, Patient's Cardiovascular Status Stable, Respiratory Function Stable and Pain level controlled  Post-op Vital Signs: Reviewed and stable  Last Vitals:  Filed Vitals:   05/31/13 0200  BP: 118/69  Pulse: 87  Temp:   Resp: 25    Complications: No apparent anesthesia complications

## 2013-06-01 ENCOUNTER — Encounter (HOSPITAL_COMMUNITY): Payer: Self-pay | Admitting: Obstetrics

## 2013-06-01 NOTE — Progress Notes (Signed)
Patient ID: Cheyenne Phillips, female   DOB: 04/20/1990, 22 y.o.   MRN: [id number] Subjective: POD# 1 s/p Cesarean Delivery.  Indications: fetal distress  RH status/Rubella reviewed. Feeding: breast Patient reports tolerating PO.  Denies HA/SOB/C/P/N/V/dizziness.  Reports flatus. Breast symptoms: no.  She reports vaginal bleeding as normal, without clots.  She is ambulating, urinating without difficulty.     Objective: Vital signs in last 24 hours: BP 107/52  Pulse 80  Temp(Src) 97.7 F (36.5 C) (Oral)  Resp 18  Ht 5\' 7"  (1.702 m)  Wt 72.576 kg (160 lb)  BMI 25.05 kg/m2  SpO2 100%  LMP 09/25/2012  Breastfeeding? Unknown   Total I/O In: 1990 [P.O.:990; I.V.:1000] Out: 1025 [Urine:1025]   Physical Exam:  General: alert CV: Regular rate and rhythm Resp: clear Abdomen: soft, nontender, normal bowel sounds Lochia: minimal Uterine Fundus: firm, below umbilicus, nontender Incision: dressing C/D Ext: extremities normal, atraumatic, no cyanosis or edema    Recent Labs  05/31/13 0155 05/31/13 0602  HGB 11.5* 11.2*  HCT 31.6* 30.8*      Assessment/Plan: 22 y.o.  status post Cesarean section. POD# 1.   Doing well, stable.              Advance diet as tolerated Start po pain meds D/C foley  HLIV  Ambulate IS Routine post-op care  Antionette CharLisa Jackson-Moore 06/01/2013, 6:57 AM

## 2013-06-02 NOTE — Plan of Care (Signed)
Problem: Discharge Progression Outcomes Goal: Complications resolved/controlled Outcome: Completed/Met Date Met:  06/02/13 Final decision is to bottle feed. Breast engorgement managed with ice packs, cabbage leaves

## 2013-06-02 NOTE — Progress Notes (Signed)
Subjective: Postpartum Day 2: Cesarean Delivery Patient reports tolerating PO, + flatus and no problems voiding.    Objective: Vital signs in last 24 hours: Temp:  [98 F (36.7 C)-98.4 F (36.9 C)] 98 F (36.7 C) (05/15 0605) Pulse Rate:  [78-86] 86 (05/15 0605) Resp:  [16-18] 16 (05/15 0605) BP: (96-103)/(54-67) 103/67 mmHg (05/15 [id number])  Physical Exam:  General: alert and cooperative Lochia: appropriate Uterine Fundus: firm Incision: healing well DVT Evaluation: No evidence of DVT seen on physical exam.   Recent Labs  05/31/13 0155 05/31/13 0602  HGB 11.5* 11.2*  HCT 31.6* 30.8*    Assessment/Plan: Status post Cesarean section. Doing well postoperatively.  Continue current care. Reviewed PP education. Plans Depo, consider prior to d/c.  Cheyenne Phillips Cheyenne Phillips 06/02/2013, 9:27 AM

## 2013-06-03 NOTE — Progress Notes (Signed)
Patient ID: Cheyenne Phillips, female   DOB: 11/03/1990, 22 y.o.   MRN: [id number] Postop day 3 Vital signs normal Incision clean and dry Legs negative discharge today

## 2013-06-03 NOTE — Discharge Summary (Signed)
Obstetric Discharge Summary Reason for Admission: onset of labor Prenatal Procedures: none Intrapartum Procedures: cesarean: low cervical, transverse Postpartum Procedures: none Complications-Operative and Postpartum: none Hemoglobin  Date Value Ref Range Status  05/31/2013 11.2* 12.0 - 15.0 g/dL Final     HCT  Date Value Ref Range Status  05/31/2013 30.8* 36.0 - 46.0 % Final    Physical Exam:  General: alert Lochia: appropriate Uterine Fundus: firm Incision: healing well DVT Evaluation: No evidence of DVT seen on physical exam.  Discharge Diagnoses: Term Pregnancy-delivered  Discharge Information: Date: 06/03/2013 Activity: pelvic rest Diet: routine Medications: Percocet Condition: stable Instructions: refer to practice specific booklet Discharge to: home Follow-up Information   Follow up with Cheyenne A, MD.   Specialty:  Obstetrics and Gynecology   Contact information:   [address] [place] [address] [phone]       Newborn Data: Live born female  Birth Weight: 6 lb 12.8 oz (3084 g) APGAR: 8, 9  Home with mother.  Cheyenne CosierBernard Phillips Linnie Phillips 06/03/2013, 7:11 AM

## 2013-06-03 NOTE — Discharge Instructions (Signed)
Discharge instructions   You can wash your hair  Shower  Eat what you want  Drink what you want  See me in 6 weeks  Your ankles are going to swell more in the next 2 weeks than when pregnant  No sex for 6 weeks   Kathreen CosierBernard A Noell Shular, MD 06/03/2013

## 2013-06-05 ENCOUNTER — Encounter: Payer: Medicaid Other | Admitting: Obstetrics

## 2013-06-20 ENCOUNTER — Encounter: Payer: Medicaid Other | Admitting: Obstetrics

## 2013-06-27 ENCOUNTER — Encounter: Payer: Medicaid Other | Admitting: Obstetrics

## 2013-08-09 ENCOUNTER — Ambulatory Visit (INDEPENDENT_AMBULATORY_CARE_PROVIDER_SITE_OTHER): Payer: Medicaid Other | Admitting: Obstetrics

## 2013-08-09 ENCOUNTER — Other Ambulatory Visit: Payer: Medicaid Other

## 2013-08-09 ENCOUNTER — Encounter: Payer: Self-pay | Admitting: Obstetrics

## 2013-08-09 DIAGNOSIS — Z3009 Encounter for other general counseling and advice on contraception: Secondary | ICD-10-CM

## 2013-08-09 NOTE — Progress Notes (Signed)
Subjective:     Cheyenne Phillips is a 23 y.o. female who presents for a postpartum visit. She is 8 weeks postpartum following a low cervical transverse Cesarean section. I have fully reviewed the prenatal and intrapartum course. The delivery was at 40 gestational weeks. Outcome: primary cesarean section, low transverse incision. Anesthesia: epidural. Postpartum course has been normal. Baby's course has been normal. Baby is feeding by bottle Rush Barer- Gerber. Bleeding no bleeding. Bowel function is normal. Bladder function is normal. Patient is sexually active. Contraception method is none. Postpartum depression screening: negative.  Tobacco, alcohol and substance abuse history reviewed.  Adult immunizations reviewed including TDAP, rubella and varicella.  The following portions of the patient's history were reviewed and updated as appropriate: allergies, current medications, past family history, past medical history, past social history, past surgical history and problem list.  Review of Systems A comprehensive review of systems was negative.   Objective:    Temp(Src) 98.1 F (36.7 C)  Ht 5\' 7"  (1.702 m)  Breastfeeding? No  General:  alert and no distress   Breasts:  inspection negative, no nipple discharge or bleeding, no masses or nodularity palpable        Abdomen: normal findings: soft, non-tender   Vulva:  normal  Vagina: normal vagina  Cervix:  no lesions  Corpus: normal size, contour, position, consistency, mobility, non-tender  Adnexa:  no mass, fullness, tenderness  Rectal Exam: Not performed.           Assessment:     Normal postpartum exam. Pap smear not done at today's visit.  Plan:    1. Contraception: Depo Provera 2. Depo Provera Rx 3. Follow up in: 2 weeks or as needed.   Preconception counseling provided Healthy lifestyle practices reviewed

## 2013-08-23 ENCOUNTER — Ambulatory Visit: Payer: Medicaid Other

## 2013-11-20 ENCOUNTER — Encounter: Payer: Self-pay | Admitting: Obstetrics

## 2014-01-15 ENCOUNTER — Encounter: Payer: Self-pay | Admitting: *Deleted

## 2014-01-16 ENCOUNTER — Encounter: Payer: Self-pay | Admitting: Obstetrics & Gynecology

## 2014-01-17 ENCOUNTER — Encounter (HOSPITAL_COMMUNITY): Payer: Self-pay | Admitting: Adult Health

## 2014-01-17 ENCOUNTER — Emergency Department (HOSPITAL_COMMUNITY)
Admission: EM | Admit: 2014-01-17 | Discharge: 2014-01-17 | Disposition: A | Discharge status: Home / Self Care | Payer: Medicaid Other | Attending: Emergency Medicine | Admitting: Emergency Medicine

## 2014-01-17 DIAGNOSIS — N760 Acute vaginitis: Secondary | ICD-10-CM | POA: Insufficient documentation

## 2014-01-17 DIAGNOSIS — N898 Other specified noninflammatory disorders of vagina: Secondary | ICD-10-CM | POA: Diagnosis present

## 2014-01-17 DIAGNOSIS — B9689 Other specified bacterial agents as the cause of diseases classified elsewhere: Secondary | ICD-10-CM

## 2014-01-17 DIAGNOSIS — Z8709 Personal history of other diseases of the respiratory system: Secondary | ICD-10-CM | POA: Insufficient documentation

## 2014-01-17 DIAGNOSIS — Z3202 Encounter for pregnancy test, result negative: Secondary | ICD-10-CM | POA: Insufficient documentation

## 2014-01-17 DIAGNOSIS — Z87891 Personal history of nicotine dependence: Secondary | ICD-10-CM | POA: Diagnosis not present

## 2014-01-17 LAB — WET PREP, GENITAL
TRICH WET PREP: NONE SEEN
Yeast Wet Prep HPF POC: NONE SEEN

## 2014-01-17 LAB — POC URINE PREG, ED: Preg Test, Ur: NEGATIVE

## 2014-01-17 MED ORDER — METRONIDAZOLE 500 MG PO TABS: 500.0000 mg | ORAL_TABLET | Freq: Two times a day (BID) | ORAL | Status: DC

## 2014-01-17 NOTE — ED Notes (Signed)
Presents with yellow vaginal discharge and open sore on vagina. She is unsure how long she has had it. LMP 2 weeks ago.

## 2014-01-17 NOTE — ED Notes (Signed)
Pelvic cart at bedside. 

## 2014-01-17 NOTE — Discharge Instructions (Signed)
Read the information below.  Use the prescribed medication as directed.  Please discuss all new medications with your pharmacist.  You may return to the Emergency Department at any time for worsening condition or any new symptoms that concern you.    If you truly do not want to become pregnant, I highly recommend that you speak with your doctor about birth control.  You can read about different options below.    If you develop fevers, abdominal pain, vomiting, or are unable to tolerate fluids by mouth return to the ER for a recheck.     Bacterial Vaginosis Bacterial vaginosis is a vaginal infection that occurs when the normal balance of bacteria in the vagina is disrupted. It results from an overgrowth of certain bacteria. This is the most common vaginal infection in women of childbearing age. Treatment is important to prevent complications, especially in pregnant women, as it can cause a premature delivery. CAUSES  Bacterial vaginosis is caused by an increase in harmful bacteria that are normally present in smaller amounts in the vagina. Several different kinds of bacteria can cause bacterial vaginosis. However, the reason that the condition develops is not fully understood. RISK FACTORS Certain activities or behaviors can put you at an increased risk of developing bacterial vaginosis, including:  Having a new sex partner or multiple sex partners.  Douching.  Using an intrauterine device (IUD) for contraception. Women do not get bacterial vaginosis from toilet seats, bedding, swimming pools, or contact with objects around them. SIGNS AND SYMPTOMS  Some women with bacterial vaginosis have no signs or symptoms. Common symptoms include:  Grey vaginal discharge.  A fishlike odor with discharge, especially after sexual intercourse.  Itching or burning of the vagina and vulva.  Burning or pain with urination. DIAGNOSIS  Your health care provider will take a medical history and examine the  vagina for signs of bacterial vaginosis. A sample of vaginal fluid may be taken. Your health care provider will look at this sample under a microscope to check for bacteria and abnormal cells. A vaginal pH test may also be done.  TREATMENT  Bacterial vaginosis may be treated with antibiotic medicines. These may be given in the form of a pill or a vaginal cream. A second round of antibiotics may be prescribed if the condition comes back after treatment.  HOME CARE INSTRUCTIONS   Only take over-the-counter or prescription medicines as directed by your health care provider.  If antibiotic medicine was prescribed, take it as directed. Make sure you finish it even if you start to feel better.  Do not have sex until treatment is completed.  Tell all sexual partners that you have a vaginal infection. They should see their health care provider and be treated if they have problems, such as a mild rash or itching.  Practice safe sex by using condoms and only having one sex partner. SEEK MEDICAL CARE IF:   Your symptoms are not improving after 3 days of treatment.  You have increased discharge or pain.  You have a fever. MAKE SURE YOU:   Understand these instructions.  Will watch your condition.  Will get help right away if you are not doing well or get worse. FOR MORE INFORMATION  Centers for Disease Control and Prevention, Division of STD Prevention: SolutionApps.co.zawww.cdc.gov/std American Sexual Health Association (ASHA): www.ashastd.org  Document Released: 01/05/2005 Document Revised: 10/26/2012 Document Reviewed: 08/17/2012 [facility] Patient Information 2015 [place], [place]. This information is not intended to replace advice given to you by  your health care provider. Make sure you discuss any questions you have with your health care provider. ° °

## 2014-01-17 NOTE — ED Provider Notes (Signed)
CSN: [id number]     Arrival date & time 01/17/14  1742 History   First MD Initiated Contact with Patient 01/17/14 1959     Chief Complaint  Patient presents with  . Vaginal Discharge     (Consider location/radiation/quality/duration/timing/severity/associated sxs/prior Treatment) HPI   Pt presents with abnormal yellow vaginal discharge x several weeks.  She was told by her boyfriend that she needed to come be seen because he was diagnosed with "something" but did not finish the treatment.  She only knows that boyfriend was supposed to avoid alcohol during treatment.  She also notes she was wearing new underwear that was rubbing and caused a sore.  She noticed this two days ago.  She is having unprotected sex.  Denies fevers, abdominal pain, urinary symptoms, abnormal vaginal bleeding, change in bowel habits.   Past Medical History  Diagnosis Date  . Seasonal allergies    Past Surgical History  Procedure Laterality Date  . Wisdom tooth extraction    . Cesarean section N/A 05/31/2013    Procedure: CESAREAN SECTION;  Surgeon: Brock Badharles A Harper, MD;  Location: [facility];  Service: Obstetrics;  Laterality: N/A;   Family History  Problem Relation Age of Onset  . Hypertension Maternal Grandmother   . Hyperlipidemia Maternal Grandmother   . Arthritis Maternal Grandmother   . Asthma Maternal Grandmother    History  Substance Use Topics  . Smoking status: Former Smoker -- 1.00 packs/day  . Smokeless tobacco: Never Used     Comment: PT smokes 3 blacks a day  . Alcohol Use: Yes   OB History    Gravida Para Term Preterm AB TAB SAB Ectopic Multiple Living   1 1 1       1      Review of Systems  All other systems reviewed and are negative.     Allergies  Other  Home Medications   Prior to Admission medications   Not on File   BP 115/63 mmHg  Pulse 91  Temp(Src) 98.1 F (36.7 C) (Oral)  Resp 18  SpO2 100%  LMP 12/31/2013 Physical Exam  Constitutional: She appears  well-developed and well-nourished. No distress.  HENT:  Head: Normocephalic and atraumatic.  Neck: Neck supple.  Cardiovascular: Normal rate.   Abdominal: Soft. She exhibits no distension and no mass. There is no tenderness. There is no rebound and no guarding.  Genitourinary: Uterus is not tender. Cervix exhibits no motion tenderness. Right adnexum displays no mass, no tenderness and no fullness. Left adnexum displays no mass, no tenderness and no fullness. No erythema, tenderness or bleeding in the vagina. No foreign body around the vagina. No signs of injury around the vagina. Vaginal discharge found.  Malodorous vaginal discharge, small amount, thin clear to opaque.  External genitalia without noted lesions.  Neurological: She is alert.  Skin: She is not diaphoretic.  Nursing note and vitals reviewed.   ED Course  Procedures (including critical care time) Labs Review Labs Reviewed  WET PREP, GENITAL - Abnormal; Notable for the following:    Clue Cells Wet Prep HPF POC FEW (*)    WBC, Wet Prep HPF POC MODERATE (*)    All other components within normal limits  GC/CHLAMYDIA PROBE AMP  HIV ANTIBODY (ROUTINE TESTING)  RPR  POC URINE PREG, ED    Imaging Review No results found.   EKG Interpretation None      MDM   Final diagnoses:  Vaginal discharge  Bacterial vaginosis    Afebrile, nontoxic  patient with abnormal vaginal discharge.  No fevers, vomiting, abdominal pain.  Clinically no PID.  Wet prep shows BV.  STD/HIV pending.  Suspect boyfriend treated with flagyl for trichomonas though no trichomonas on wet prep.  If this is the case, flagyl should cover trichomonas infection.  Patient informed that sexual partner will also need to be tested and treated for any positive STDs.  D/C home with flagyl. PCP follow up.  Discussed result, findings, treatment, and follow up  with patient.  Pt given return precautions.  Pt verbalizes understanding and agrees with plan.          Trixie Dredgemily Zhaire Locker, PA-C 01/17/14 2239  Tilden FossaElizabeth Rees, MD 01/17/14 2251

## 2014-01-18 LAB — RPR

## 2014-01-18 LAB — GC/CHLAMYDIA PROBE AMP
CT PROBE, AMP APTIMA: NEGATIVE
GC PROBE AMP APTIMA: POSITIVE — AB

## 2014-01-18 LAB — HIV ANTIBODY (ROUTINE TESTING W REFLEX): HIV 1&2 Ab, 4th Generation: NONREACTIVE

## 2014-01-19 ENCOUNTER — Telehealth (HOSPITAL_COMMUNITY): Payer: Self-pay | Admitting: *Deleted

## 2014-01-19 NOTE — L&D Delivery Note (Signed)
Delivery Note This is a 24 year old G 2 P2 who was admitted for Active phase labor.. She progressed normally with epidural at 4-5cm to the second stage of labor.  She pushed for 10 min.  At 12:27pm she delivered a viable infant female, VBAC cephalic with compound arm presentation and LOA, over an intact perineum.  A nuchal cord  Identified was not.  Infant placed on maternal abdomen.  Delayed cord clamping for 10-15 minutes.  Cord double clamped and cut. Apgar scores were 9 and 9. The placenta delivered spontaneously, shultz, with a 3 vessel cord.  Inspection revealed no laceration. The uterus was firm bleeding stable.  EBL was 150.    Placenta and umbilical artery blood gas were not sent.  There were no complications during the procedure.  Mom and baby skin to skin following delivery. Left in stable condition.   APGAR: 9, 9; weight 7 lb 1 oz (3204 g).   Placenta status: Intact, Spontaneous.  Cord: 3 vessels with the following complications: None.  Cord pH: N/A  Anesthesia: Epidural  Episiotomy: None Lacerations: None Suture Repair: none Est. Blood Loss (mL): 150  Mom to postpartum.  Baby to Couplet care / Skin to Skin.  Roe Coombsachelle A Denney, CNM 11/20/2014, 3:11 PM

## 2014-05-08 ENCOUNTER — Inpatient Hospital Stay (HOSPITAL_COMMUNITY)
Admission: AD | Admit: 2014-05-08 | Discharge: 2014-05-08 | Disposition: A | Discharge status: Home / Self Care | Payer: Medicaid Other | Source: Ambulatory Visit | Attending: Obstetrics & Gynecology | Admitting: Obstetrics & Gynecology

## 2014-05-08 ENCOUNTER — Encounter (HOSPITAL_COMMUNITY): Payer: Self-pay | Admitting: Medical

## 2014-05-08 DIAGNOSIS — Z3201 Encounter for pregnancy test, result positive: Secondary | ICD-10-CM | POA: Diagnosis not present

## 2014-05-08 DIAGNOSIS — N760 Acute vaginitis: Secondary | ICD-10-CM | POA: Diagnosis not present

## 2014-05-08 DIAGNOSIS — N898 Other specified noninflammatory disorders of vagina: Secondary | ICD-10-CM | POA: Diagnosis present

## 2014-05-08 DIAGNOSIS — Z87891 Personal history of nicotine dependence: Secondary | ICD-10-CM | POA: Insufficient documentation

## 2014-05-08 DIAGNOSIS — B9689 Other specified bacterial agents as the cause of diseases classified elsewhere: Secondary | ICD-10-CM | POA: Diagnosis not present

## 2014-05-08 HISTORY — DX: Sickle-cell trait: D57.3

## 2014-05-08 HISTORY — DX: Other specified health status: Z78.9

## 2014-05-08 LAB — WET PREP, GENITAL
TRICH WET PREP: NONE SEEN
Yeast Wet Prep HPF POC: NONE SEEN

## 2014-05-08 LAB — POCT PREGNANCY, URINE: PREG TEST UR: POSITIVE — AB

## 2014-05-08 LAB — OB RESULTS CONSOLE GC/CHLAMYDIA: GC PROBE AMP, GENITAL: NEGATIVE

## 2014-05-08 MED ORDER — METRONIDAZOLE 500 MG PO TABS: 500.0000 mg | ORAL_TABLET | Freq: Two times a day (BID) | ORAL | Status: DC

## 2014-05-08 NOTE — MAU Note (Signed)
Pt states she had a positive home preg test and wants to verify. Denies pain.

## 2014-05-08 NOTE — Discharge Instructions (Signed)
First Trimester of Pregnancy The first trimester of pregnancy is from week 1 until the end of week 12 (months 1 through 3). During this time, your baby will begin to develop inside you. At 6-8 weeks, the eyes and face are formed, and the heartbeat can be seen on ultrasound. At the end of 12 weeks, all the baby's organs are formed. Prenatal care is all the medical care you receive before the birth of your baby. Make sure you get good prenatal care and follow all of your doctor's instructions. HOME CARE  Medicines  Take medicine only as told by your doctor. Some medicines are safe and some are not during pregnancy.  Take your prenatal vitamins as told by your doctor.  Take medicine that helps you poop (stool softener) as needed if your doctor says it is okay. Diet  Eat regular, healthy meals.  Your doctor will tell you the amount of weight gain that is right for you.  Avoid raw meat and uncooked cheese.  If you feel sick to your stomach (nauseous) or throw up (vomit):  Eat 4 or 5 small meals a day instead of 3 large meals.  Try eating a few soda crackers.  Drink liquids between meals instead of during meals.  If you have a hard time pooping (constipation):  Eat high-fiber foods like fresh vegetables, fruit, and whole grains.  Drink enough fluids to keep your pee (urine) clear or pale yellow. Activity and Exercise  Exercise only as told by your doctor. Stop exercising if you have cramps or pain in your lower belly (abdomen) or low back.  Try to avoid standing for long periods of time. Move your legs often if you must stand in one place for a long time.  Avoid heavy lifting.  Wear low-heeled shoes. Sit and stand up straight.  You can have sex unless your doctor tells you not to. Relief of Pain or Discomfort  Wear a good support bra if your breasts are sore.  Take warm water baths (sitz baths) to soothe pain or discomfort caused by hemorrhoids. Use hemorrhoid cream if your  doctor says it is okay.  Rest with your legs raised if you have leg cramps or low back pain.  Wear support hose if you have puffy, bulging veins (varicose veins) in your legs. Raise (elevate) your feet for 15 minutes, 3-4 times a day. Limit salt in your diet. Prenatal Care  Schedule your prenatal visits by the twelfth week of pregnancy.  Write down your questions. Take them to your prenatal visits.  Keep all your prenatal visits as told by your doctor. Safety  Wear your seat belt at all times when driving.  Make a list of emergency phone numbers. The list should include numbers for family, friends, the hospital, and police and fire departments. General Tips  Ask your doctor for a referral to a local prenatal class. Begin classes no later than at the start of month 6 of your pregnancy.  Ask for help if you need counseling or help with nutrition. Your doctor can give you advice or tell you where to go for help.  Do not use hot tubs, steam rooms, or saunas.  Do not douche or use tampons or scented sanitary pads.  Do not cross your legs for long periods of time.  Avoid litter boxes and soil used by cats.  Avoid all smoking, herbs, and alcohol. Avoid drugs not approved by your doctor.  Visit your dentist. At home, brush your teeth  with a soft toothbrush. Be gentle when you floss. GET HELP IF:  You are dizzy.  You have mild cramps or pressure in your lower belly.  You have a nagging pain in your belly area.  You continue to feel sick to your stomach, throw up, or have watery poop (diarrhea).  You have a bad smelling fluid coming from your vagina.  You have pain with peeing (urination).  You have increased puffiness (swelling) in your face, hands, legs, or ankles. GET HELP RIGHT AWAY IF:   You have a fever.  You are leaking fluid from your vagina.  You have spotting or bleeding from your vagina.  You have very bad belly cramping or pain.  You gain or lose weight  rapidly.  You throw up blood. It may look like coffee grounds.  You are around people who have [place] measles, fifth disease, or chickenpox.  You have a very bad headache.  You have shortness of breath.  You have any kind of trauma, such as from a fall or a car accident. Document Released: 06/24/2007 Document Revised: 05/22/2013 Document Reviewed: 11/15/2012 [facility] Patient Information 2015 [place], [place]. This information is not intended to replace advice given to you by your health care provider. Make sure you discuss any questions you have with your health care provider.  Bacterial Vaginosis Bacterial vaginosis is a vaginal infection that occurs when the normal balance of bacteria in the vagina is disrupted. It results from an overgrowth of certain bacteria. This is the most common vaginal infection in women of childbearing age. Treatment is important to prevent complications, especially in pregnant women, as it can cause a premature delivery. CAUSES  Bacterial vaginosis is caused by an increase in harmful bacteria that are normally present in smaller amounts in the vagina. Several different kinds of bacteria can cause bacterial vaginosis. However, the reason that the condition develops is not fully understood. RISK FACTORS Certain activities or behaviors can put you at an increased risk of developing bacterial vaginosis, including:  Having a new sex partner or multiple sex partners.  Douching.  Using an intrauterine device (IUD) for contraception. Women do not get bacterial vaginosis from toilet seats, bedding, swimming pools, or contact with objects around them. SIGNS AND SYMPTOMS  Some women with bacterial vaginosis have no signs or symptoms. Common symptoms include:  Grey vaginal discharge.  A fishlike odor with discharge, especially after sexual intercourse.  Itching or burning of the vagina and vulva.  Burning or pain with urination. DIAGNOSIS  Your health care  provider will take a medical history and examine the vagina for signs of bacterial vaginosis. A sample of vaginal fluid may be taken. Your health care provider will look at this sample under a microscope to check for bacteria and abnormal cells. A vaginal pH test may also be done.  TREATMENT  Bacterial vaginosis may be treated with antibiotic medicines. These may be given in the form of a pill or a vaginal cream. A second round of antibiotics may be prescribed if the condition comes back after treatment.  HOME CARE INSTRUCTIONS   Only take over-the-counter or prescription medicines as directed by your health care provider.  If antibiotic medicine was prescribed, take it as directed. Make sure you finish it even if you start to feel better.  Do not have sex until treatment is completed.  Tell all sexual partners that you have a vaginal infection. They should see their health care provider and be treated if they have problems,  such as a mild rash or itching.  Practice safe sex by using condoms and only having one sex partner. SEEK MEDICAL CARE IF:   Your symptoms are not improving after 3 days of treatment.  You have increased discharge or pain.  You have a fever. MAKE SURE YOU:   Understand these instructions.  Will watch your condition.  Will get help right away if you are not doing well or get worse. FOR MORE INFORMATION  Centers for Disease Control and Prevention, Division of STD Prevention: SolutionApps.co.za American Sexual Health Association (ASHA): www.ashastd.org  Document Released: 01/05/2005 Document Revised: 10/26/2012 Document Reviewed: 08/17/2012 [facility] Patient Information 2015 [place], [place]. This information is not intended to replace advice given to you by your health care provider. Make sure you discuss any questions you have with your health care provider.

## 2014-05-08 NOTE — MAU Provider Note (Signed)
History     CSN: [id number]  Arrival date and time: 05/08/14 2102   First Provider Initiated Contact with Patient 05/08/14 2150      No chief complaint on file.  HPI  Cheyenne Phillips is a 24 y.o. G2P1001 at 1938w4d who presents to MAU today for confirmation of pregnancy and with complaint of vaginal discharge. The patient states recent +HPT and LMP of 02/23/14. She states a small amount of thin, white discharge that started today. She denies abdominal pain, vaginal bleeding, vaginal odor, fever, N/V/D or UTI symptoms.    OB History    Gravida Para Term Preterm AB TAB SAB Ectopic Multiple Living   2 1 1       1       Past Medical History  Diagnosis Date  . Seasonal allergies   . Medical history non-contributory   . Sickle cell trait     Past Surgical History  Procedure Laterality Date  . Wisdom tooth extraction    . Cesarean section N/A 05/31/2013    Procedure: CESAREAN SECTION;  Surgeon: Brock Badharles A Harper, MD;  Location: [facility];  Service: Obstetrics;  Laterality: N/A;    Family History  Problem Relation Age of Onset  . Hypertension Maternal Grandmother   . Hyperlipidemia Maternal Grandmother   . Arthritis Maternal Grandmother   . Asthma Maternal Grandmother     History  Substance Use Topics  . Smoking status: Former Smoker -- 1.00 packs/day  . Smokeless tobacco: Never Used     Comment: PT smokes 3 blacks a day  . Alcohol Use: Yes     Comment: only social in past    Allergies:  Allergies  Allergen Reactions  . Other     seasonal    No prescriptions prior to admission    Review of Systems  Constitutional: Negative for fever and malaise/fatigue.  Gastrointestinal: Negative for nausea, vomiting and abdominal pain.  Genitourinary: Negative for dysuria, urgency and frequency.       + vaginal discharge Neg - vaginal bleeding   Physical Exam   Blood pressure 106/59, pulse 87, temperature 97.9 F (36.6 C), temperature source Oral, resp. rate 16, height  5\' 6"  (1.676 m), weight 138 lb (62.596 kg), last menstrual period 02/23/2014, SpO2 100 %, not currently breastfeeding.  Physical Exam  Constitutional: She is oriented to person, place, and time. She appears well-developed and well-nourished. No distress.  HENT:  Head: Normocephalic.  Cardiovascular: Normal rate.   Respiratory: Effort normal.  GI: Soft. She exhibits no distension and no mass. There is no tenderness. There is no rebound and no guarding.  Genitourinary: Uterus is not enlarged and not tender. Cervix exhibits no motion tenderness, no discharge and no friability. Right adnexum displays no mass and no tenderness. Left adnexum displays no mass and no tenderness. No bleeding in the vagina. Vaginal discharge (small amount of thin, white discharge noted) found.  Neurological: She is alert and oriented to person, place, and time.  Skin: Skin is warm and dry. No erythema.  Psychiatric: She has a normal mood and affect.   Results for orders placed or performed during the hospital encounter of 05/08/14 (from the past 24 hour(s))  Pregnancy, urine POC     Status: Abnormal   Collection Time: 05/08/14  9:49 PM  Result Value Ref Range   Preg Test, Ur POSITIVE (A) NEGATIVE  Wet prep, genital     Status: Abnormal   Collection Time: 05/08/14  9:52 PM  Result Value  Ref Range   Yeast Wet Prep HPF POC NONE SEEN NONE SEEN   Trich, Wet Prep NONE SEEN NONE SEEN   Clue Cells Wet Prep HPF POC FEW (A) NONE SEEN   WBC, Wet Prep HPF POC FEW (A) NONE SEEN    MAU Course  Procedures None  MDM +UPT Wet prep, GC/Chlamydia, HIV and RPR  Assessment and Plan  A: Positive pregnancy test Bacterial vaginosis  P: Discharge home Rx for Flagyl given to patient First trimester warning signs reviewed Pregnancy confirmation letter given. Plans to return to Dr. Clearance Coots for prenatal care. Advised to call and make an appointment ASAP Patient may return to MAU as needed or if her condition were to change or  worsen   Marny Lowenstein, PA-C  05/08/2014, 11:51 PM

## 2014-05-09 LAB — RPR: RPR Ser Ql: NONREACTIVE

## 2014-05-09 LAB — GC/CHLAMYDIA PROBE AMP (~~LOC~~) NOT AT ARMC
CHLAMYDIA, DNA PROBE: NEGATIVE
Neisseria Gonorrhea: NEGATIVE

## 2014-05-09 LAB — HIV ANTIBODY (ROUTINE TESTING W REFLEX): HIV Screen 4th Generation wRfx: NONREACTIVE

## 2014-06-13 ENCOUNTER — Encounter: Payer: Self-pay | Admitting: Obstetrics

## 2014-06-13 ENCOUNTER — Ambulatory Visit (INDEPENDENT_AMBULATORY_CARE_PROVIDER_SITE_OTHER): Payer: Medicaid Other | Admitting: Obstetrics

## 2014-06-13 VITALS — BP 109/65 | HR 96 | Temp 98.2°F | Wt 141.0 lb

## 2014-06-13 DIAGNOSIS — Z3482 Encounter for supervision of other normal pregnancy, second trimester: Secondary | ICD-10-CM

## 2014-06-13 DIAGNOSIS — Z3A15 15 weeks gestation of pregnancy: Secondary | ICD-10-CM

## 2014-06-13 DIAGNOSIS — Z331 Pregnant state, incidental: Secondary | ICD-10-CM

## 2014-06-13 DIAGNOSIS — Z1389 Encounter for screening for other disorder: Secondary | ICD-10-CM

## 2014-06-13 DIAGNOSIS — Z3687 Encounter for antenatal screening for uncertain dates: Secondary | ICD-10-CM

## 2014-06-13 DIAGNOSIS — O2690 Pregnancy related conditions, unspecified, unspecified trimester: Secondary | ICD-10-CM | POA: Diagnosis not present

## 2014-06-13 LAB — POCT URINALYSIS DIPSTICK
Bilirubin, UA: NEGATIVE
Blood, UA: NEGATIVE
Glucose, UA: NEGATIVE
Ketones, UA: NEGATIVE
Nitrite, UA: NEGATIVE
Protein, UA: NEGATIVE
Spec Grav, UA: 1.015
UROBILINOGEN UA: NEGATIVE
pH, UA: 7

## 2014-06-13 MED ORDER — OB COMPLETE PETITE 35-5-1-200 MG PO CAPS: 1.0000 | ORAL_CAPSULE | Freq: Every day | ORAL | Status: DC

## 2014-06-13 NOTE — Progress Notes (Signed)
Subjective:    Cheyenne Phillips is being seen today for her first obstetrical visit.  This is not a planned pregnancy. She is at 4324w5d gestation. Her obstetrical history is significant for none. Relationship with FOB: significant other, not living together. Patient does intend to breast feed. Pregnancy history fully reviewed.  The information documented in the HPI was reviewed and verified.  Menstrual History: OB History    Gravida Para Term Preterm AB TAB SAB Ectopic Multiple Living   2 1 1       1       Patient's last menstrual period was 02/23/2014.    Past Medical History  Diagnosis Date  . Seasonal allergies   . Medical history non-contributory   . Sickle cell trait     Past Surgical History  Procedure Laterality Date  . Wisdom tooth extraction    . Cesarean section N/A 05/31/2013    Procedure: CESAREAN SECTION;  Surgeon: Brock Badharles A Harper, MD;  Location: [facility];  Service: Obstetrics;  Laterality: N/A;     (Not in a hospital admission) Allergies  Allergen Reactions  . Other     seasonal    History  Substance Use Topics  . Smoking status: Former Smoker -- 1.00 packs/day  . Smokeless tobacco: Never Used  . Alcohol Use: No     Comment: only social in past    Family History  Problem Relation Age of Onset  . Hypertension Maternal Grandmother   . Hyperlipidemia Maternal Grandmother   . Arthritis Maternal Grandmother   . Asthma Maternal Grandmother      Review of Systems Constitutional: negative for weight loss Gastrointestinal: negative for vomiting Genitourinary:negative for genital lesions and vaginal discharge and dysuria Musculoskeletal:negative for back pain Behavioral/Psych: negative for abusive relationship, depression, illegal drug usage and tobacco use    Objective:    BP 109/65 mmHg  Pulse 96  Temp(Src) 98.2 F (36.8 C)  Wt 141 lb (63.957 kg)  LMP 02/23/2014 General Appearance:    Alert, cooperative, no distress, appears stated age  Head:     Normocephalic, without obvious abnormality, atraumatic  Eyes:    PERRL, conjunctiva/corneas clear, EOM's intact, fundi    benign, both eyes  Ears:    Normal TM's and external ear canals, both ears  Nose:   Nares normal, septum midline, mucosa normal, no drainage    or sinus tenderness  Throat:   Lips, mucosa, and tongue normal; teeth and gums normal  Neck:   Supple, symmetrical, trachea midline, no adenopathy;    thyroid:  no enlargement/tenderness/nodules; no carotid   bruit or JVD  Back:     Symmetric, no curvature, ROM normal, no CVA tenderness  Lungs:     Clear to auscultation bilaterally, respirations unlabored  Chest Wall:    No tenderness or deformity   Heart:    Regular rate and rhythm, S1 and S2 normal, no murmur, rub   or gallop  Breast Exam:    No tenderness, masses, or nipple abnormality  Abdomen:     Soft, non-tender, bowel sounds active all four quadrants,    no masses, no organomegaly  Genitalia:    Normal female without lesion, discharge or tenderness  Extremities:   Extremities normal, atraumatic, no cyanosis or edema  Pulses:   2+ and symmetric all extremities  Skin:   Skin color, texture, turgor normal, no rashes or lesions  Lymph nodes:   Cervical, supraclavicular, and axillary nodes normal  Neurologic:   CNII-XII intact, normal strength,  sensation and reflexes    throughout      Lab Review Urine pregnancy test Labs reviewed yes Radiologic studies reviewed no Assessment:    Pregnancy at [redacted]w[redacted]d weeks    Unsure LMP   Plan:   Ultrasound ordered for dating. Follow up in 4 weeks.    Prenatal vitamins.  Counseling provided regarding continued use of seat belts, cessation of alcohol consumption, smoking or use of illicit drugs; infection precautions i.e., influenza/TDAP immunizations, toxoplasmosis,CMV, parvovirus, listeria and varicella; workplace safety, exercise during pregnancy; routine dental care, safe medications, sexual activity, hot tubs, saunas, pools,  travel, caffeine use, fish and methlymercury, potential toxins, hair treatments, varicose veins Weight gain recommendations per IOM guidelines reviewed: underweight/BMI< 18.5--> gain 28 - 40 lbs; normal weight/BMI 18.5 - 24.9--> gain 25 - 35 lbs; overweight/BMI 25 - 29.9--> gain 15 - 25 lbs; obese/BMI >30->gain  11 - 20 lbs Problem list reviewed and updated. FIRST/CF mutation testing/NIPT/QUAD SCREEN/fragile X/[name] population testing/Spinal muscular atrophy discussed: requested. Role of ultrasound in pregnancy discussed; fetal survey: requested. Amniocentesis discussed: not indicated. VBAC calculator score: VBAC consent form provided No orders of the defined types were placed in this encounter.   Orders Placed This Encounter  Procedures  . Culture, OB Urine  . US OB Comp + 14 Wk    Standing Status: Future     Number of Occurrences:      Standing Expiration Date: 08/13/2015    Order Specific Question:  Reason for Exam (SYMPTOM  OR DIAGNOSIS REQUIRED)    Answer:  Unsure LMP    Order Specific Question:  Preferred imaging location?    Answer:  Internal  . Obstetric panel  . HIV antibody  . Hemoglobinopathy evaluation  . Varicella zoster antibody, IgG  . Vit D  25 hydroxy (rtn osteoporosis monitoring)  . POCT urinalysis dipstick

## 2014-06-14 LAB — HIV ANTIBODY (ROUTINE TESTING W REFLEX): HIV 1&2 Ab, 4th Generation: NONREACTIVE

## 2014-06-14 LAB — OBSTETRIC PANEL
Antibody Screen: NEGATIVE
Basophils Absolute: 0 10*3/uL (ref 0.0–0.1)
Basophils Relative: 0 % (ref 0–1)
EOS PCT: 2 % (ref 0–5)
Eosinophils Absolute: 0.1 10*3/uL (ref 0.0–0.7)
HEMATOCRIT: 32.8 % — AB (ref 36.0–46.0)
HEMOGLOBIN: 11.1 g/dL — AB (ref 12.0–15.0)
Hepatitis B Surface Ag: NEGATIVE
LYMPHS ABS: 1 10*3/uL (ref 0.7–4.0)
Lymphocytes Relative: 18 % (ref 12–46)
MCH: 27.6 pg (ref 26.0–34.0)
MCHC: 33.8 g/dL (ref 30.0–36.0)
MCV: 81.6 fL (ref 78.0–100.0)
MONO ABS: 0.6 10*3/uL (ref 0.1–1.0)
MONOS PCT: 10 % (ref 3–12)
MPV: 11.6 fL (ref 8.6–12.4)
NEUTROS ABS: 4.1 10*3/uL (ref 1.7–7.7)
NEUTROS PCT: 70 % (ref 43–77)
PLATELETS: 212 10*3/uL (ref 150–400)
RBC: 4.02 MIL/uL (ref 3.87–5.11)
RDW: 15.1 % (ref 11.5–15.5)
RH TYPE: POSITIVE
Rubella: 1.93 Index — ABNORMAL HIGH (ref <0.90)
WBC: 5.8 10*3/uL (ref 4.0–10.5)

## 2014-06-14 LAB — VARICELLA ZOSTER ANTIBODY, IGG: Varicella IgG: 811.7 Index — ABNORMAL HIGH (ref <135.00)

## 2014-06-14 LAB — VITAMIN D 25 HYDROXY (VIT D DEFICIENCY, FRACTURES): Vit D, 25-Hydroxy: 9 ng/mL — ABNORMAL LOW (ref 30–100)

## 2014-06-15 ENCOUNTER — Ambulatory Visit (HOSPITAL_COMMUNITY)
Admission: RE | Admit: 2014-06-15 | Discharge: 2014-06-15 | Disposition: A | Discharge status: Home / Self Care | Payer: Medicaid Other | Source: Ambulatory Visit | Attending: Obstetrics | Admitting: Obstetrics

## 2014-06-15 ENCOUNTER — Other Ambulatory Visit: Payer: Self-pay | Admitting: Obstetrics

## 2014-06-15 DIAGNOSIS — Z3689 Encounter for other specified antenatal screening: Secondary | ICD-10-CM | POA: Insufficient documentation

## 2014-06-15 DIAGNOSIS — Z3482 Encounter for supervision of other normal pregnancy, second trimester: Secondary | ICD-10-CM | POA: Diagnosis not present

## 2014-06-15 DIAGNOSIS — D582 Other hemoglobinopathies: Secondary | ICD-10-CM

## 2014-06-15 DIAGNOSIS — Z3A16 16 weeks gestation of pregnancy: Secondary | ICD-10-CM | POA: Insufficient documentation

## 2014-06-15 LAB — HEMOGLOBINOPATHY EVALUATION
HEMOGLOBIN OTHER: 35.3 % — AB
HGB A2 QUANT: 2.9 % (ref 2.2–3.2)
HGB A: 61.8 % — AB (ref 96.8–97.8)
Hgb F Quant: 0 % (ref 0.0–2.0)
Hgb S Quant: 0 %

## 2014-06-15 LAB — CULTURE, OB URINE: Colony Count: 30000

## 2014-06-19 ENCOUNTER — Telehealth: Payer: Self-pay

## 2014-06-19 ENCOUNTER — Encounter (HOSPITAL_COMMUNITY): Payer: Self-pay | Admitting: Obstetrics

## 2014-06-19 NOTE — Telephone Encounter (Signed)
[facility] at 1pm told patient to arrive by 12:45 - left message on her vm.

## 2014-06-21 ENCOUNTER — Ambulatory Visit (HOSPITAL_COMMUNITY)
Admission: RE | Admit: 2014-06-21 | Discharge: 2014-06-21 | Disposition: A | Discharge status: Home / Self Care | Payer: Medicaid Other | Source: Ambulatory Visit | Attending: Obstetrics | Admitting: Obstetrics

## 2014-06-21 DIAGNOSIS — D582 Other hemoglobinopathies: Secondary | ICD-10-CM | POA: Insufficient documentation

## 2014-06-21 NOTE — Progress Notes (Signed)
Genetic Counseling  High-Risk Gestation Note  Appointment Date:  06/21/2014 Referred By: Shelly Bombard, MD Date of Birth:  Mar 20, 1990   Pregnancy History: G2P1001 Estimated Date of Delivery: 11/25/14 Estimated Gestational Age: 72w4dAttending: PBenjaman Lobe MD   I met with [name] for genetic counseling given that she has hemoglobin C trait.   Ms. 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 Ms. HShifrinindicated that she has known that she has this trait. She reported that her full brother also has trait but possibly a different one from her. No additional relatives were reported with hemoglobin C trait, sickle cell trait or sickle cell anemia. Ms. HCieslikreported that the father of the pregnancy does not have sickle cell trait.  He reportedly has no individuals in his family with a hemoglobin variant nor sickle cell disease/anemia.    We reviewed genes, chromosomes, and autosomal recessive inheritance. Hemoglobin is the oxygen-carrying pigment of red blood cells.  The type of hemoglobin we have is determined by inheritance.  Most people typically inherit hemoglobin A.  Other variant types of hemoglobin include hemoglobin C or hemoglobin S (sickle cell trait).  If an individual inherits hemoglobin A from one parent and hemoglobin C from the other parent, that individual has hemoglobin C trait (noted as hemoglobin AC).  Likewise, an individual who inherits hemoglobin A from one parent and hemoglobin S from the other parent has hemoglobin S trait (sickle cell trait) (hemoglobin AS).  Hemoglobin C trait and sickle cell trait are not typically associated with illness.   We discussed that when one partner carries hemoglobin C and the other carrier hemoglobin S, there is a 1 in 4 (25%) chance to inherit hemoglobin [place] disease, which has features similar to sickle cell disease (Hb SS).  If both  partners were carriers of hemoglobin C trait, there would be a 1 in 4 (25%) risk for hemoglobin C disease, which is different from sickle cell or hemoglobin  disease. Hemoglobin C disease is generally a benign condition, although it can be associated with mild hemolytic anemia and/or splenomegaly (enlarged spleen).    Given the recessive inheritance, we discussed the importance of understanding the carrier status of the father of the pregnancy in order to accurately predict the risk of a hemoglobinopathy in the fetus. The patient reported that the father of the pregnancy does not have sickle cell trait. We do not have medical records available to verify the reported information. We reviewed that the father of the pregnancy has the option of repeat testing (hemoglobin electrophoresis) to determine whether he has any hemoglobin variant, including sickle cell trait (Hb S).  She understands that an accurate risk assessment cannot be performed without further information. However, in the case that the father of the pregnancy does not have sickle cell trait or any other hemoglobin variant, the fetus would not be expected to have an increased risk for a hemoglobinopathy but would have a 1 in 2 (50%) chance to inherit hemoglobin C trait. We also reviewed the [place]   The family histories were otherwise found to be noncontributory for birth defects, intellectual disability, and known genetic conditions. Without further information regarding the provided family history, an accurate genetic risk cannot be calculated. Further genetic counseling is warranted if more information is obtained.  Cheyenne Phillips exposure to environmental toxins or chemical agents. She denied the  use of alcohol, tobacco or street drugs. She denied significant viral illnesses during the course of her pregnancy. Her medical and surgical histories were noncontributory.    I counseled Cheyenne Phillips regarding the above risks and available options.  The approximate face-to-face time with the genetic counselor was 25 minutes.  Chipper Oman, MS Certified Genetic Counselor 06/21/2014

## 2014-07-11 ENCOUNTER — Other Ambulatory Visit (HOSPITAL_COMMUNITY): Payer: Self-pay | Admitting: Maternal and Fetal Medicine

## 2014-07-11 ENCOUNTER — Encounter: Payer: Self-pay | Admitting: Obstetrics

## 2014-07-11 ENCOUNTER — Ambulatory Visit (INDEPENDENT_AMBULATORY_CARE_PROVIDER_SITE_OTHER): Payer: Medicaid Other | Admitting: Obstetrics

## 2014-07-11 VITALS — BP 111/64 | HR 84 | Temp 96.9°F | Wt 144.0 lb

## 2014-07-11 DIAGNOSIS — O34219 Maternal care for unspecified type scar from previous cesarean delivery: Secondary | ICD-10-CM

## 2014-07-11 DIAGNOSIS — B373 Candidiasis of vulva and vagina: Secondary | ICD-10-CM | POA: Diagnosis not present

## 2014-07-11 DIAGNOSIS — N760 Acute vaginitis: Secondary | ICD-10-CM

## 2014-07-11 DIAGNOSIS — Z3482 Encounter for supervision of other normal pregnancy, second trimester: Secondary | ICD-10-CM

## 2014-07-11 DIAGNOSIS — B9689 Other specified bacterial agents as the cause of diseases classified elsewhere: Secondary | ICD-10-CM

## 2014-07-11 DIAGNOSIS — A499 Bacterial infection, unspecified: Secondary | ICD-10-CM

## 2014-07-11 DIAGNOSIS — B3731 Acute candidiasis of vulva and vagina: Secondary | ICD-10-CM

## 2014-07-11 LAB — POCT URINALYSIS DIPSTICK
Bilirubin, UA: NEGATIVE
Blood, UA: NEGATIVE
Glucose, UA: NORMAL
Ketones, UA: NEGATIVE
LEUKOCYTES UA: NEGATIVE
Nitrite, UA: NEGATIVE
Spec Grav, UA: 1.01
Urobilinogen, UA: NEGATIVE
pH, UA: 7

## 2014-07-11 MED ORDER — TINIDAZOLE 500 MG PO TABS: 1000.0000 mg | ORAL_TABLET | Freq: Every day | ORAL | Status: DC

## 2014-07-11 MED ORDER — FLUCONAZOLE 150 MG PO TABS: 150.0000 mg | ORAL_TABLET | Freq: Once | ORAL | Status: DC

## 2014-07-11 NOTE — Addendum Note (Signed)
Addended by: Marya Landry D on: 07/11/2014 03:43 PM   Modules accepted: Orders

## 2014-07-11 NOTE — Addendum Note (Signed)
Addended by: Clearnce Hasten on: 07/11/2014 04:42 PM   Modules accepted: Orders

## 2014-07-11 NOTE — Progress Notes (Signed)
Subjective:    Cheyenne Phillips is a 24 y.o. female being seen today for her obstetrical visit. She is at [redacted]w[redacted]d gestation. Patient reports vaginal irritation. Fetal movement: normal.  Problem List Items Addressed This Visit    None    Visit Diagnoses    Encounter for supervision of other normal pregnancy in second trimester    -  Primary    BV (bacterial vaginosis)        Relevant Medications    fluconazole (DIFLUCAN) 150 MG tablet    tinidazole (TINDAMAX) 500 MG tablet    Candida vaginitis        Relevant Medications    fluconazole (DIFLUCAN) 150 MG tablet    tinidazole (TINDAMAX) 500 MG tablet      Patient Active Problem List   Diagnosis Date Noted  . 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 Encounter for fetal anatomic survey   . [redacted] weeks gestation of pregnancy   . S/P cesarean section 05/31/2013  . Abnormal hemoglobin 05/30/2013  . Post term pregnancy over 40 weeks 05/30/2013   Objective:    BP 111/64 mmHg  Pulse 84  Temp(Src) 96.9 F (36.1 C)  Wt 144 lb (65.318 kg)  LMP 02/18/2014 FHT:  150 BPM  Uterine Size: size equals dates  Presentation: unsure     Assessment:    Pregnancy @ [redacted]w[redacted]d weeks   Plan:     labs reviewed, problem list updated Consent signed. GBS sent TDAP offered  Rhogam given for RH negative Pediatrician: discussed. Infant feeding: plans to breastfeed. Maternity leave: discussed. Cigarette smoking: former smoker. No orders of the defined types were placed in this encounter.   Meds ordered this encounter  Medications  . fluconazole (DIFLUCAN) 150 MG tablet    Sig: Take 1 tablet (150 mg total) by mouth once.    Dispense:  1 tablet    Refill:  2  . tinidazole (TINDAMAX) 500 MG tablet    Sig: Take 2 tablets (1,000 mg total) by mouth daily with breakfast.    Dispense:  10 tablet    Refill:  2   Follow up in 4 weeks.

## 2014-07-13 ENCOUNTER — Ambulatory Visit (HOSPITAL_COMMUNITY)
Admission: RE | Admit: 2014-07-13 | Discharge: 2014-07-13 | Disposition: A | Discharge status: Home / Self Care | Payer: Medicaid Other | Source: Ambulatory Visit | Attending: Obstetrics | Admitting: Obstetrics

## 2014-07-13 ENCOUNTER — Encounter (HOSPITAL_COMMUNITY): Payer: Self-pay

## 2014-07-13 VITALS — BP 114/65 | HR 82 | Wt 148.0 lb

## 2014-07-13 DIAGNOSIS — IMO0002 Reserved for concepts with insufficient information to code with codable children: Secondary | ICD-10-CM | POA: Insufficient documentation

## 2014-07-13 DIAGNOSIS — Z36 Encounter for antenatal screening of mother: Secondary | ICD-10-CM | POA: Diagnosis not present

## 2014-07-13 DIAGNOSIS — Z0489 Encounter for examination and observation for other specified reasons: Secondary | ICD-10-CM | POA: Insufficient documentation

## 2014-07-13 DIAGNOSIS — Z3A20 20 weeks gestation of pregnancy: Secondary | ICD-10-CM | POA: Diagnosis not present

## 2014-07-13 DIAGNOSIS — O3421 Maternal care for scar from previous cesarean delivery: Secondary | ICD-10-CM | POA: Insufficient documentation

## 2014-07-13 DIAGNOSIS — O352XX0 Maternal care for (suspected) hereditary disease in fetus, not applicable or unspecified: Secondary | ICD-10-CM | POA: Diagnosis not present

## 2014-07-13 DIAGNOSIS — D582 Other hemoglobinopathies: Secondary | ICD-10-CM

## 2014-07-13 DIAGNOSIS — O34219 Maternal care for unspecified type scar from previous cesarean delivery: Secondary | ICD-10-CM | POA: Insufficient documentation

## 2014-07-14 LAB — SURESWAB, VAGINOSIS/VAGINITIS PLUS
Atopobium vaginae: 7.1 Log (cells/mL)
BV CATEGORY: UNDETERMINED — AB
C. GLABRATA, DNA: NOT DETECTED
C. albicans, DNA: DETECTED — AB
C. parapsilosis, DNA: NOT DETECTED
C. trachomatis RNA, TMA: NOT DETECTED
C. tropicalis, DNA: NOT DETECTED
Gardnerella vaginalis: >8 Log (cells/mL)
LACTOBACILLUS SPECIES: DETECTED Log (cells/mL)
MEGASPHAERA SPECIES: 7.9 Log (cells/mL)
N. gonorrhoeae RNA, TMA: DETECTED — AB
T. vaginalis RNA, QL TMA: NOT DETECTED

## 2014-07-15 ENCOUNTER — Other Ambulatory Visit: Payer: Self-pay | Admitting: Obstetrics

## 2014-07-15 DIAGNOSIS — N760 Acute vaginitis: Principal | ICD-10-CM

## 2014-07-15 DIAGNOSIS — B373 Candidiasis of vulva and vagina: Secondary | ICD-10-CM

## 2014-07-15 DIAGNOSIS — B3731 Acute candidiasis of vulva and vagina: Secondary | ICD-10-CM

## 2014-07-15 DIAGNOSIS — B9689 Other specified bacterial agents as the cause of diseases classified elsewhere: Secondary | ICD-10-CM

## 2014-07-15 DIAGNOSIS — A549 Gonococcal infection, unspecified: Secondary | ICD-10-CM

## 2014-07-15 MED ORDER — CEFTRIAXONE SODIUM 250 MG IJ SOLR: 250.0000 mg | Freq: Once | INTRAMUSCULAR | Status: DC

## 2014-07-15 MED ORDER — TINIDAZOLE 500 MG PO TABS: 1000.0000 mg | ORAL_TABLET | Freq: Every day | ORAL | Status: DC

## 2014-07-15 MED ORDER — FLUCONAZOLE 150 MG PO TABS: 150.0000 mg | ORAL_TABLET | Freq: Once | ORAL | Status: DC

## 2014-07-15 MED ORDER — AZITHROMYCIN 250 MG PO TABS: 1000.0000 mg | ORAL_TABLET | Freq: Once | ORAL | Status: DC

## 2014-07-18 ENCOUNTER — Ambulatory Visit (INDEPENDENT_AMBULATORY_CARE_PROVIDER_SITE_OTHER): Payer: Medicaid Other | Admitting: *Deleted

## 2014-07-18 VITALS — BP 107/67 | HR 83 | Temp 98.6°F | Ht 66.0 in | Wt 147.0 lb

## 2014-07-18 DIAGNOSIS — A64 Unspecified sexually transmitted disease: Secondary | ICD-10-CM

## 2014-07-18 MED ORDER — LIDOCAINE HCL 1 % IJ SOLN
250.0000 mg | Freq: Once | INTRAMUSCULAR | Status: AC
  Administered 2014-07-18: 250 mg via INTRAMUSCULAR

## 2014-07-18 NOTE — Progress Notes (Signed)
Pt. In office for ceftriaxone injection. Pt. Took injection on the rt. Side of upper outter quadrant. Pt. Did well.

## 2014-07-24 ENCOUNTER — Other Ambulatory Visit: Payer: Self-pay | Admitting: Certified Nurse Midwife

## 2014-08-08 ENCOUNTER — Encounter: Payer: Medicaid Other | Admitting: Obstetrics

## 2014-08-15 ENCOUNTER — Encounter: Payer: Self-pay | Admitting: Obstetrics

## 2014-08-15 ENCOUNTER — Ambulatory Visit (INDEPENDENT_AMBULATORY_CARE_PROVIDER_SITE_OTHER): Payer: Medicaid Other | Admitting: Obstetrics

## 2014-08-15 VITALS — BP 115/67 | HR 89 | Temp 98.4°F | Wt 148.0 lb

## 2014-08-15 DIAGNOSIS — Z3482 Encounter for supervision of other normal pregnancy, second trimester: Secondary | ICD-10-CM | POA: Diagnosis not present

## 2014-08-15 LAB — POCT URINALYSIS DIPSTICK
BILIRUBIN UA: NEGATIVE
Blood, UA: NEGATIVE
Glucose, UA: NEGATIVE
Ketones, UA: NEGATIVE
LEUKOCYTES UA: NEGATIVE
Nitrite, UA: NEGATIVE
PROTEIN UA: NEGATIVE
SPEC GRAV UA: 1.015
UROBILINOGEN UA: NEGATIVE
pH, UA: 7

## 2014-08-15 NOTE — Progress Notes (Signed)
Subjective:    Cheyenne Phillips is a 24 y.o. female being seen today for her obstetrical visit. She is at [redacted]w[redacted]d gestation. Patient reports: no complaints . Fetal movement: normal.  Problem List Items Addressed This Visit    None    Visit Diagnoses    Encounter for supervision of other normal pregnancy in second trimester    -  Primary    Relevant Orders    POCT urinalysis dipstick      Patient Active Problem List   Diagnosis Date Noted  . [redacted] weeks gestation of pregnancy   . Evaluate anatomy not seen on prior sonogram   . History of cesarean section complicating pregnancy   . Hereditary disease in family possibly affecting fetus, antepartum   . 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 Encounter for fetal anatomic survey   . [redacted] weeks gestation of pregnancy   . S/P cesarean section 05/31/2013  . Abnormal hemoglobin 05/30/2013  . Post term pregnancy over 40 weeks 05/30/2013   Objective:    BP 115/67 mmHg  Pulse 89  Temp(Src) 98.4 F (36.9 C)  Wt 148 lb (67.132 kg)  LMP 02/18/2014 FHT: 150 BPM  Uterine Size: size equals dates     Assessment:    Pregnancy @ [redacted]w[redacted]d    Plan:    OBGCT: ordered for next visit.  Labs, problem list reviewed and updated 2 hr GTT planned Follow up in 4 weeks.

## 2014-09-12 ENCOUNTER — Other Ambulatory Visit: Payer: Medicaid Other

## 2014-09-12 ENCOUNTER — Encounter: Payer: Self-pay | Admitting: Obstetrics

## 2014-09-12 ENCOUNTER — Ambulatory Visit (INDEPENDENT_AMBULATORY_CARE_PROVIDER_SITE_OTHER): Payer: Medicaid Other | Admitting: Obstetrics

## 2014-09-12 VITALS — BP 125/76 | HR 91 | Temp 97.4°F | Wt 154.2 lb

## 2014-09-12 DIAGNOSIS — N898 Other specified noninflammatory disorders of vagina: Secondary | ICD-10-CM

## 2014-09-12 DIAGNOSIS — Z3482 Encounter for supervision of other normal pregnancy, second trimester: Secondary | ICD-10-CM

## 2014-09-12 DIAGNOSIS — O26893 Other specified pregnancy related conditions, third trimester: Secondary | ICD-10-CM

## 2014-09-12 LAB — CBC
HEMATOCRIT: 33 % — AB (ref 36.0–46.0)
HEMOGLOBIN: 11.4 g/dL — AB (ref 12.0–15.0)
MCH: 28.3 pg (ref 26.0–34.0)
MCHC: 34.5 g/dL (ref 30.0–36.0)
MCV: 81.9 fL (ref 78.0–100.0)
MPV: 11.1 fL (ref 8.6–12.4)
Platelets: 158 10*3/uL (ref 150–400)
RBC: 4.03 MIL/uL (ref 3.87–5.11)
RDW: 14.9 % (ref 11.5–15.5)
WBC: 10.8 10*3/uL — AB (ref 4.0–10.5)

## 2014-09-12 LAB — POCT URINALYSIS DIPSTICK
BILIRUBIN UA: NEGATIVE
Glucose, UA: NORMAL
Ketones, UA: NEGATIVE
Leukocytes, UA: NEGATIVE
NITRITE UA: NEGATIVE
PH UA: 6
RBC UA: NEGATIVE
Spec Grav, UA: 1.02
Urobilinogen, UA: NEGATIVE

## 2014-09-12 NOTE — Progress Notes (Signed)
Subjective:    Cheyenne Phillips is a 24 y.o. female being seen today for her obstetrical visit. She is at [redacted]w[redacted]d gestation. Patient reports increased vaginal discharge, thick and white, no itching, but malodorous.  Fetal movement: normal.  Problem List Items Addressed This Visit    None    Visit Diagnoses    Encounter for supervision of other normal pregnancy in second trimester    -  Primary    Relevant Orders    POCT urinalysis dipstick (Completed)    Glucose Tolerance, 2 Hours w/1 Hour    CBC    HIV antibody    RPR    Vaginal discharge during pregnancy in third trimester        Relevant Orders    SureSwab, Vaginosis/Vaginitis Plus      Patient Active Problem List   Diagnosis Date Noted  . [redacted] weeks gestation of pregnancy   . Evaluate anatomy not seen on prior sonogram   . History of cesarean section complicating pregnancy   . Hereditary disease in family possibly affecting fetus, antepartum   . 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 Encounter for fetal anatomic survey   . [redacted] weeks gestation of pregnancy   . S/P cesarean section 05/31/2013  . Abnormal hemoglobin 05/30/2013  . Post term pregnancy over 40 weeks 05/30/2013   Objective:    BP 125/76 mmHg  Pulse 91  Temp(Src) 97.4 F (36.3 C)  Wt 154 lb 3.2 oz (69.945 kg)  LMP 02/18/2014 FHT:  150 BPM  Uterine Size: size equals dates  Presentation: unsure     Assessment:    Pregnancy @ [redacted]w[redacted]d weeks    Vaginal discharge  Plan:   Wet prep and cultures sent   labs reviewed, problem list updated Consent signed. GBS sent TDAP offered  Rhogam given for RH negative Pediatrician: discussed. Infant feeding: plans to breastfeed. Maternity leave: discussed. Cigarette smoking: never smoked.  Orders Placed This Encounter  Procedures  . SureSwab, Vaginosis/Vaginitis Plus  . Glucose Tolerance, 2 Hours w/1 Hour  . CBC  . HIV antibody  . RPR  . POCT urinalysis dipstick   No orders of the defined types were placed in  this encounter.   Follow up in 2 Weeks.

## 2014-09-13 LAB — HIV ANTIBODY (ROUTINE TESTING W REFLEX): HIV 1&2 Ab, 4th Generation: NONREACTIVE

## 2014-09-13 LAB — RPR

## 2014-09-15 LAB — SURESWAB, VAGINOSIS/VAGINITIS PLUS
Atopobium vaginae: NOT DETECTED Log (cells/mL)
C. ALBICANS, DNA: NOT DETECTED
C. PARAPSILOSIS, DNA: DETECTED — AB
C. TRACHOMATIS RNA, TMA: NOT DETECTED
C. TROPICALIS, DNA: NOT DETECTED
C. glabrata, DNA: NOT DETECTED
Gardnerella vaginalis: NOT DETECTED Log (cells/mL)
LACTOBACILLUS SPECIES: 7.5 Log (cells/mL)
MEGASPHAERA SPECIES: NOT DETECTED Log (cells/mL)
N. gonorrhoeae RNA, TMA: NOT DETECTED
T. VAGINALIS RNA, QL TMA: NOT DETECTED

## 2014-09-15 LAB — GLUCOSE TOLERANCE, 2 HOURS W/ 1HR
GLUCOSE, 2 HOUR: 86 mg/dL (ref 70–139)
GLUCOSE, FASTING: 69 mg/dL (ref 65–99)
Glucose, 1 hour: 113 mg/dL (ref 70–170)

## 2014-09-16 ENCOUNTER — Other Ambulatory Visit: Payer: Self-pay | Admitting: Obstetrics

## 2014-09-16 DIAGNOSIS — B373 Candidiasis of vulva and vagina: Secondary | ICD-10-CM

## 2014-09-16 DIAGNOSIS — B3731 Acute candidiasis of vulva and vagina: Secondary | ICD-10-CM

## 2014-09-16 MED ORDER — FLUCONAZOLE 150 MG PO TABS: 150.0000 mg | ORAL_TABLET | Freq: Once | ORAL | Status: DC

## 2014-09-20 ENCOUNTER — Other Ambulatory Visit: Payer: Self-pay | Admitting: Certified Nurse Midwife

## 2014-09-26 ENCOUNTER — Ambulatory Visit (INDEPENDENT_AMBULATORY_CARE_PROVIDER_SITE_OTHER): Payer: Medicaid Other | Admitting: Obstetrics

## 2014-09-26 VITALS — BP 119/68 | HR 85 | Temp 98.2°F | Wt 153.0 lb

## 2014-09-26 DIAGNOSIS — Z3483 Encounter for supervision of other normal pregnancy, third trimester: Secondary | ICD-10-CM

## 2014-09-26 LAB — POCT URINALYSIS DIPSTICK
Bilirubin, UA: NEGATIVE
Blood, UA: NEGATIVE
Glucose, UA: NEGATIVE
Ketones, UA: NEGATIVE
LEUKOCYTES UA: NEGATIVE
Nitrite, UA: NEGATIVE
PH UA: 6.5
PROTEIN UA: NEGATIVE
SPEC GRAV UA: 1.015
UROBILINOGEN UA: NEGATIVE

## 2014-09-27 ENCOUNTER — Encounter: Payer: Self-pay | Admitting: Obstetrics

## 2014-09-27 NOTE — Progress Notes (Signed)
Subjective:    Cheyenne Phillips is a 24 y.o. female being seen today for her obstetrical visit. She is at [redacted]w[redacted]d gestation. Patient reports difficulty breathing at times. Fetal movement: normal.  Problem List Items Addressed This Visit    None    Visit Diagnoses    Encounter for supervision of other normal pregnancy in third trimester    -  Primary    Relevant Orders    POCT urinalysis dipstick (Completed)      Patient Active Problem List   Diagnosis Date Noted  . [redacted] weeks gestation of pregnancy   . Evaluate anatomy not seen on prior sonogram   . History of cesarean section complicating pregnancy   . Hereditary disease in family possibly affecting fetus, antepartum   . 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 Encounter for fetal anatomic survey   . [redacted] weeks gestation of pregnancy   . S/P cesarean section 05/31/2013  . Abnormal hemoglobin 05/30/2013  . Post term pregnancy over 40 weeks 05/30/2013   Objective:    BP 119/68 mmHg  Pulse 85  Temp(Src) 98.2 F (36.8 C)  Wt 153 lb (69.4 kg)  LMP 02/18/2014 FHT:  150 BPM  Uterine Size: size equals dates  Presentation: unsure     Assessment:    Pregnancy @ [redacted]w[redacted]d weeks   Plan:     labs reviewed, problem list updated Consent signed. GBS sent TDAP offered  Rhogam given for RH negative Pediatrician: discussed. Infant feeding: plans to breastfeed. Maternity leave: discussed. Cigarette smoking: former smoker. Orders Placed This Encounter  Procedures  . POCT urinalysis dipstick   No orders of the defined types were placed in this encounter.   Follow up in 2 Weeks.

## 2014-10-10 ENCOUNTER — Encounter: Payer: Medicaid Other | Admitting: Obstetrics

## 2014-10-11 ENCOUNTER — Ambulatory Visit (INDEPENDENT_AMBULATORY_CARE_PROVIDER_SITE_OTHER): Payer: Medicaid Other | Admitting: Obstetrics

## 2014-10-11 ENCOUNTER — Encounter: Payer: Self-pay | Admitting: Obstetrics

## 2014-10-11 VITALS — BP 109/69 | HR 91 | Temp 97.9°F | Wt 156.0 lb

## 2014-10-11 DIAGNOSIS — Z3A33 33 weeks gestation of pregnancy: Secondary | ICD-10-CM

## 2014-10-11 DIAGNOSIS — Z331 Pregnant state, incidental: Secondary | ICD-10-CM

## 2014-10-11 DIAGNOSIS — Z1389 Encounter for screening for other disorder: Secondary | ICD-10-CM

## 2014-10-11 DIAGNOSIS — Z3483 Encounter for supervision of other normal pregnancy, third trimester: Secondary | ICD-10-CM

## 2014-10-11 LAB — POCT URINALYSIS DIPSTICK
Bilirubin, UA: NEGATIVE
Blood, UA: NEGATIVE
GLUCOSE UA: NEGATIVE
Ketones, UA: NEGATIVE
LEUKOCYTES UA: NEGATIVE
NITRITE UA: NEGATIVE
PROTEIN UA: NEGATIVE
Spec Grav, UA: 1.02
UROBILINOGEN UA: NEGATIVE
pH, UA: 6

## 2014-10-11 NOTE — Progress Notes (Signed)
Subjective:    Cheyenne Phillips is a 24 y.o. female being seen today for her obstetrical visit. She is at [redacted]w[redacted]d gestation. Patient reports backache, occasional contractions and pressure. Fetal movement: normal.  Problem List Items Addressed This Visit    None    Visit Diagnoses    Encounter for supervision of other normal pregnancy in third trimester    -  Primary    Relevant Orders    POCT urinalysis dipstick (Completed)      Patient Active Problem List   Diagnosis Date Noted  . [redacted] weeks gestation of pregnancy   . Evaluate anatomy not seen on prior sonogram   . History of cesarean section complicating pregnancy   . Hereditary disease in family possibly affecting fetus, antepartum   . 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 Encounter for fetal anatomic survey   . [redacted] weeks gestation of pregnancy   . S/P cesarean section 05/31/2013  . Abnormal hemoglobin 05/30/2013  . Post term pregnancy over 40 weeks 05/30/2013   Objective:    BP 109/69 mmHg  Pulse 91  Temp(Src) 97.9 F (36.6 C)  Wt 156 lb (70.761 kg)  LMP 02/18/2014 FHT:  150 BPM  Uterine Size: size equals dates  Presentation: unsure     Assessment:    Pregnancy @ [redacted]w[redacted]d weeks   Plan:     labs reviewed, problem list updated Consent signed. GBS sent TDAP offered  Rhogam given for RH negative Pediatrician: discussed. Infant feeding: plans to breastfeed. Maternity leave: discussed. Cigarette smoking: former smoker. Orders Placed This Encounter  Procedures  . POCT urinalysis dipstick   No orders of the defined types were placed in this encounter.   Follow up in 2 Weeks.

## 2014-10-25 ENCOUNTER — Encounter: Payer: Medicaid Other | Admitting: Obstetrics

## 2014-10-30 ENCOUNTER — Encounter: Payer: Medicaid Other | Admitting: Obstetrics

## 2014-11-16 ENCOUNTER — Encounter: Payer: Self-pay | Admitting: Obstetrics

## 2014-11-16 ENCOUNTER — Ambulatory Visit (INDEPENDENT_AMBULATORY_CARE_PROVIDER_SITE_OTHER): Payer: Self-pay | Admitting: Obstetrics

## 2014-11-16 VITALS — BP 111/75 | HR 92 | Temp 97.7°F | Wt 164.0 lb

## 2014-11-16 DIAGNOSIS — Z3483 Encounter for supervision of other normal pregnancy, third trimester: Secondary | ICD-10-CM

## 2014-11-16 DIAGNOSIS — N898 Other specified noninflammatory disorders of vagina: Secondary | ICD-10-CM

## 2014-11-16 LAB — POCT URINALYSIS DIPSTICK
BILIRUBIN UA: NEGATIVE
Blood, UA: NEGATIVE
GLUCOSE UA: NEGATIVE
Ketones, UA: NEGATIVE
LEUKOCYTES UA: NEGATIVE
NITRITE UA: NEGATIVE
Protein, UA: NEGATIVE
Spec Grav, UA: 1.015
Urobilinogen, UA: NEGATIVE
pH, UA: 7

## 2014-11-16 NOTE — Progress Notes (Signed)
Subjective:    Cheyenne Phillips is a 24 y.o. female being seen today for her obstetrical visit. She is at 6924w5d gestation. Patient reports no complaints. Fetal movement: normal.  Problem List Items Addressed This Visit    None    Visit Diagnoses    Supervision of other normal pregnancy, antepartum, third trimester    -  Primary    Relevant Orders    POCT urinalysis dipstick (Completed)    Vaginal discharge        Relevant Orders    SureSwab, Vaginosis/Vaginitis Plus      Patient Active Problem List   Diagnosis Date Noted  . [redacted] weeks gestation of pregnancy   . Evaluate anatomy not seen on prior sonogram   . History of cesarean section complicating pregnancy   . Hereditary disease in family possibly affecting fetus, antepartum   . Hemoglobin C trait (HCC) 06/21/2014  . Encounter for fetal anatomic survey   . [redacted] weeks gestation of pregnancy   . S/P cesarean section 05/31/2013  . Abnormal hemoglobin ([facility]CC) 05/30/2013  . Post term pregnancy over 40 weeks 05/30/2013    Objective:    BP 111/75 mmHg  Pulse 92  Temp(Src) 97.7 F (36.5 C)  Wt 164 lb (74.39 kg)  LMP 02/18/2014 FHT: 150 BPM  Uterine Size: size equals dates  Presentations: cephalic  Pelvic Exam:              Dilation: 1cm       Effacement: 50%             Station:  -3    Consistency: medium            Position: posterior     Assessment:    Pregnancy @ 3924w5d weeks   Plan:   Plans for delivery: Vaginal anticipated; labs reviewed; problem list updated Counseling: Consent signed. Infant feeding: plans to breastfeed. Cigarette smoking: former smoker. L&D discussion: symptoms of labor, discussed when to call, discussed what number to call, anesthetic/analgesic options reviewed and delivering clinician:  plans no preference. Postpartum supports and preparation: circumcision discussed and contraception plans discussed.  Follow up in 1 Week.

## 2014-11-18 ENCOUNTER — Encounter (HOSPITAL_COMMUNITY): Payer: Self-pay | Admitting: *Deleted

## 2014-11-18 ENCOUNTER — Inpatient Hospital Stay (HOSPITAL_COMMUNITY)
Admission: AD | Admit: 2014-11-18 | Discharge: 2014-11-18 | Disposition: A | Discharge status: Home / Self Care | Payer: Medicaid Other | Source: Ambulatory Visit | Attending: Obstetrics | Admitting: Obstetrics

## 2014-11-18 DIAGNOSIS — Z3493 Encounter for supervision of normal pregnancy, unspecified, third trimester: Secondary | ICD-10-CM | POA: Insufficient documentation

## 2014-11-18 NOTE — MAU Note (Signed)
Pt presents to MAU with complaints of contractions since yesterday. Denies any vaginal bleeding or LOF

## 2014-11-18 NOTE — Discharge Instructions (Signed)

## 2014-11-18 NOTE — Progress Notes (Signed)
Dr Gaynell FaceMarshall notified of pt's arrival and complaints , orders received to discharge home

## 2014-11-20 ENCOUNTER — Inpatient Hospital Stay (HOSPITAL_COMMUNITY): Payer: Medicaid Other | Admitting: Anesthesiology

## 2014-11-20 ENCOUNTER — Inpatient Hospital Stay (HOSPITAL_COMMUNITY)
Admission: AD | Admit: 2014-11-20 | Discharge: 2014-11-22 | DRG: 775 | Disposition: A | Discharge status: Home / Self Care | Payer: Medicaid Other | Source: Ambulatory Visit | Attending: Obstetrics | Admitting: Obstetrics

## 2014-11-20 ENCOUNTER — Encounter (HOSPITAL_COMMUNITY): Payer: Self-pay

## 2014-11-20 DIAGNOSIS — Z87891 Personal history of nicotine dependence: Secondary | ICD-10-CM

## 2014-11-20 DIAGNOSIS — Z3A39 39 weeks gestation of pregnancy: Secondary | ICD-10-CM | POA: Diagnosis not present

## 2014-11-20 DIAGNOSIS — D582 Other hemoglobinopathies: Secondary | ICD-10-CM

## 2014-11-20 DIAGNOSIS — O34211 Maternal care for low transverse scar from previous cesarean delivery: Principal | ICD-10-CM | POA: Diagnosis present

## 2014-11-20 DIAGNOSIS — IMO0001 Reserved for inherently not codable concepts without codable children: Secondary | ICD-10-CM

## 2014-11-20 DIAGNOSIS — Z825 Family history of asthma and other chronic lower respiratory diseases: Secondary | ICD-10-CM

## 2014-11-20 DIAGNOSIS — Z8249 Family history of ischemic heart disease and other diseases of the circulatory system: Secondary | ICD-10-CM | POA: Diagnosis not present

## 2014-11-20 DIAGNOSIS — Z8261 Family history of arthritis: Secondary | ICD-10-CM | POA: Diagnosis not present

## 2014-11-20 LAB — CBC
HEMATOCRIT: 34.9 % — AB (ref 36.0–46.0)
HEMOGLOBIN: 12.4 g/dL (ref 12.0–15.0)
MCH: 26.8 pg (ref 26.0–34.0)
MCHC: 35.5 g/dL (ref 30.0–36.0)
MCV: 75.4 fL — AB (ref 78.0–100.0)
Platelets: 159 10*3/uL (ref 150–400)
RBC: 4.63 MIL/uL (ref 3.87–5.11)
RDW: 15.4 % (ref 11.5–15.5)
WBC: 9.7 10*3/uL (ref 4.0–10.5)

## 2014-11-20 LAB — ABO/RH: ABO/RH(D): O POS

## 2014-11-20 LAB — RPR: RPR Ser Ql: NONREACTIVE

## 2014-11-20 LAB — GROUP B STREP BY PCR: GROUP B STREP BY PCR: INVALID — AB

## 2014-11-20 LAB — TYPE AND SCREEN
ABO/RH(D): O POS
Antibody Screen: NEGATIVE

## 2014-11-20 MED ORDER — PRENATAL MULTIVITAMIN CH: 1.0000 | ORAL_TABLET | Freq: Every day | ORAL | Status: DC

## 2014-11-20 MED ORDER — LIDOCAINE HCL (PF) 1 % IJ SOLN
INTRAMUSCULAR | Status: DC | PRN
  Administered 2014-11-20 (×2): 4 mL via EPIDURAL

## 2014-11-20 MED ORDER — INFLUENZA VAC SPLIT QUAD 0.5 ML IM SUSY
0.5000 mL | PREFILLED_SYRINGE | INTRAMUSCULAR | Status: AC
  Administered 2014-11-21: 0.5 mL via INTRAMUSCULAR

## 2014-11-20 MED ORDER — OXYTOCIN 40 UNITS IN LACTATED RINGERS INFUSION - SIMPLE MED: 62.5000 mL/h | INTRAVENOUS | Status: DC | PRN

## 2014-11-20 MED ORDER — CITRIC ACID-SODIUM CITRATE 334-500 MG/5ML PO SOLN: 30.0000 mL | ORAL | Status: DC | PRN

## 2014-11-20 MED ORDER — IBUPROFEN 600 MG PO TABS
600.0000 mg | ORAL_TABLET | Freq: Four times a day (QID) | ORAL | Status: DC
  Administered 2014-11-20 – 2014-11-22 (×8): 600 mg via ORAL
  Filled 2014-11-20 (×8): qty 1

## 2014-11-20 MED ORDER — OXYTOCIN 40 UNITS IN LACTATED RINGERS INFUSION - SIMPLE MED
62.5000 mL/h | INTRAVENOUS | Status: DC
  Filled 2014-11-20: qty 1000

## 2014-11-20 MED ORDER — DIPHENHYDRAMINE HCL 25 MG PO CAPS
25.0000 mg | ORAL_CAPSULE | Freq: Four times a day (QID) | ORAL | Status: DC | PRN
Start: 2014-11-20 — End: 2014-11-22

## 2014-11-20 MED ORDER — PHENYLEPHRINE 40 MCG/ML (10ML) SYRINGE FOR IV PUSH (FOR BLOOD PRESSURE SUPPORT)
80.0000 ug | PREFILLED_SYRINGE | INTRAVENOUS | Status: DC | PRN
  Filled 2014-11-20: qty 2
  Filled 2014-11-20: qty 20

## 2014-11-20 MED ORDER — LANOLIN HYDROUS EX OINT: TOPICAL_OINTMENT | CUTANEOUS | Status: DC | PRN

## 2014-11-20 MED ORDER — ZOLPIDEM TARTRATE 5 MG PO TABS: 5.0000 mg | ORAL_TABLET | Freq: Every evening | ORAL | Status: DC | PRN

## 2014-11-20 MED ORDER — ACETAMINOPHEN 325 MG PO TABS: 650.0000 mg | ORAL_TABLET | ORAL | Status: DC | PRN

## 2014-11-20 MED ORDER — OXYTOCIN BOLUS FROM INFUSION
500.0000 mL | INTRAVENOUS | Status: DC
  Administered 2014-11-20: 500 mL via INTRAVENOUS

## 2014-11-20 MED ORDER — FLEET ENEMA 7-19 GM/118ML RE ENEM: 1.0000 | ENEMA | RECTAL | Status: DC | PRN

## 2014-11-20 MED ORDER — LACTATED RINGERS IV SOLN
500.0000 mL | INTRAVENOUS | Status: DC | PRN
  Administered 2014-11-20: 500 mL via INTRAVENOUS

## 2014-11-20 MED ORDER — SODIUM CHLORIDE 0.9 % IV SOLN
2.0000 g | Freq: Four times a day (QID) | INTRAVENOUS | Status: DC
  Administered 2014-11-20: 2 g via INTRAVENOUS
  Filled 2014-11-20 (×3): qty 2000

## 2014-11-20 MED ORDER — SENNOSIDES-DOCUSATE SODIUM 8.6-50 MG PO TABS
2.0000 | ORAL_TABLET | ORAL | Status: DC
  Administered 2014-11-21 (×2): 2 via ORAL
  Filled 2014-11-20 (×2): qty 2

## 2014-11-20 MED ORDER — OXYCODONE-ACETAMINOPHEN 5-325 MG PO TABS: 1.0000 | ORAL_TABLET | ORAL | Status: DC | PRN

## 2014-11-20 MED ORDER — OB COMPLETE PETITE 35-5-1-200 MG PO CAPS
1.0000 | ORAL_CAPSULE | Freq: Every day | ORAL | Status: DC
Start: 2014-11-21 — End: 2014-11-20

## 2014-11-20 MED ORDER — BENZOCAINE-MENTHOL 20-0.5 % EX AERO
1.0000 "application " | INHALATION_SPRAY | CUTANEOUS | Status: DC | PRN
  Administered 2014-11-20 – 2014-11-22 (×2): 1 via TOPICAL
  Filled 2014-11-20 (×2): qty 56

## 2014-11-20 MED ORDER — TETANUS-DIPHTH-ACELL PERTUSSIS 5-2.5-18.5 LF-MCG/0.5 IM SUSP
0.5000 mL | Freq: Once | INTRAMUSCULAR | Status: AC
  Administered 2014-11-21: 0.5 mL via INTRAMUSCULAR
  Filled 2014-11-20: qty 0.5

## 2014-11-20 MED ORDER — WITCH HAZEL-GLYCERIN EX PADS: 1.0000 "application " | MEDICATED_PAD | CUTANEOUS | Status: DC | PRN

## 2014-11-20 MED ORDER — SIMETHICONE 80 MG PO CHEW: 80.0000 mg | CHEWABLE_TABLET | ORAL | Status: DC | PRN

## 2014-11-20 MED ORDER — PRENATAL MULTIVITAMIN CH
1.0000 | ORAL_TABLET | Freq: Every day | ORAL | Status: DC
  Administered 2014-11-20 – 2014-11-22 (×3): 1 via ORAL
  Filled 2014-11-20 (×3): qty 1

## 2014-11-20 MED ORDER — ONDANSETRON HCL 4 MG/2ML IJ SOLN: 4.0000 mg | INTRAMUSCULAR | Status: DC | PRN

## 2014-11-20 MED ORDER — DIBUCAINE 1 % RE OINT: 1.0000 "application " | TOPICAL_OINTMENT | RECTAL | Status: DC | PRN

## 2014-11-20 MED ORDER — EPHEDRINE 5 MG/ML INJ
10.0000 mg | INTRAVENOUS | Status: DC | PRN
  Filled 2014-11-20: qty 2

## 2014-11-20 MED ORDER — OXYCODONE-ACETAMINOPHEN 5-325 MG PO TABS: 2.0000 | ORAL_TABLET | ORAL | Status: DC | PRN

## 2014-11-20 MED ORDER — ONDANSETRON HCL 4 MG/2ML IJ SOLN: 4.0000 mg | Freq: Four times a day (QID) | INTRAMUSCULAR | Status: DC | PRN

## 2014-11-20 MED ORDER — LACTATED RINGERS IV SOLN
INTRAVENOUS | Status: DC
  Administered 2014-11-20: 11:00:00 via INTRAVENOUS

## 2014-11-20 MED ORDER — DIPHENHYDRAMINE HCL 50 MG/ML IJ SOLN: 12.5000 mg | INTRAMUSCULAR | Status: DC | PRN

## 2014-11-20 MED ORDER — FENTANYL 2.5 MCG/ML BUPIVACAINE 1/10 % EPIDURAL INFUSION (WH - ANES)
14.0000 mL/h | INTRAMUSCULAR | Status: DC | PRN
  Administered 2014-11-20 (×2): 14 mL/h via EPIDURAL
  Filled 2014-11-20: qty 125

## 2014-11-20 MED ORDER — ONDANSETRON HCL 4 MG PO TABS: 4.0000 mg | ORAL_TABLET | ORAL | Status: DC | PRN

## 2014-11-20 MED ORDER — LIDOCAINE HCL (PF) 1 % IJ SOLN
30.0000 mL | INTRAMUSCULAR | Status: DC | PRN
  Filled 2014-11-20: qty 30

## 2014-11-20 NOTE — MAU Note (Signed)
Pt c/o contractions for 3 days and have been every 5 mins since 2am. Denies LOF or vag bleeding. +FM. Was 1cm on Sunday.

## 2014-11-20 NOTE — MAU Note (Signed)
Notified patient of G2P1 patient previous c/s wants TOLAC. GBS unknown 4/90/-2 ctx 4-5 mins. Desires epidural. Provider gave admit orders.

## 2014-11-20 NOTE — Progress Notes (Signed)
md made aware, will come and review informed consent form for TOLAC  with pt at bedside

## 2014-11-20 NOTE — H&P (Signed)
Cheyenne Phillips Age is a 24 y.o. female presenting for UC's.  [facility]  Desires VB[facility]. Maternal Medical History:  Reason for admission: Contractions.   Fetal activity: Perceived fetal activity is normal.   Last perceived fetal movement was within the past hour.    Prenatal complications: no prenatal complications   OB History    Gravida Para Term Preterm AB TAB SAB Ectopic Multiple Living   2 1 1       1      Past Medical History  Diagnosis Date  . Seasonal allergies   . Medical history non-contributory   . Sickle cell trait [facility](HCC)    Past Surgical History  Procedure Laterality Date  . Wisdom tooth extraction    . Cesarean section N/A 05/31/2013    Procedure: CESAREAN SECTION;  Surgeon: Brock Badharles A Harper, MD;  Location: [facility] ORS;  Service: Obstetrics;  Laterality: N/A;   Family History: family history includes Arthritis in her maternal grandmother; Asthma in her maternal grandmother; Hyperlipidemia in her maternal grandmother; Hypertension in her maternal grandmother. Social History:  reports that she has quit smoking. She has never used smokeless tobacco. She reports that she does not drink alcohol or use illicit drugs.   Prenatal Transfer Tool  Maternal Diabetes: No Genetic Screening: Normal Maternal Ultrasounds/Referrals: Normal Fetal Ultrasounds or other Referrals:  None Maternal Substance Abuse:  No Significant Maternal Medications:  None Significant Maternal Lab Results:  None Other Comments:  None  Review of Systems  All other systems reviewed and are negative.   Dilation: 4 Effacement (%): 90 Exam by:: Kayren EavesAshley Garvey RN Blood pressure 100/81, pulse 98, temperature 98 F (36.7 C), temperature source Oral, resp. rate 16, height 5\' 6"  (1.676 m), weight 161 lb (73.029 kg), last menstrual period 02/18/2014, SpO2 99 %, not currently breastfeeding. Maternal Exam:  Uterine Assessment: Contraction strength is moderate.  Abdomen: Patient reports no abdominal  tenderness. Fetal presentation: vertex  Introitus: Normal vulva. Normal vagina.  Cervix: Cervix evaluated by digital exam.     Physical Exam  Nursing note and vitals reviewed. Constitutional: She is oriented to person, place, and time. She appears well-developed and well-nourished.  HENT:  Head: Normocephalic and atraumatic.  Eyes: Conjunctivae are normal. Pupils are equal, round, and reactive to light.  Neck: Normal range of motion. Neck supple.  Cardiovascular: Normal rate and regular rhythm.   Respiratory: Effort normal.  GI: Soft.  Genitourinary: Vagina normal and uterus normal.  Musculoskeletal: Normal range of motion.  Neurological: She is alert and oriented to person, place, and time.  Skin: Skin is warm and dry.  Psychiatric: She has a normal mood and affect. Her behavior is normal. Judgment and thought content normal.    Prenatal labs: ABO, Rh: --/--/O POS (11/01 0748) Antibody: NEG (11/01 0748) Rubella: 1.93 (05/25 1504) RPR: NON REAC (08/24 0946)  HBsAg: NEGATIVE (05/25 1504)  HIV: NONREACTIVE (08/24 0946)  GBS:     Assessment/Plan: 39.2 weeks.  Active labor.  Admit.   HARPER,CHARLES A 11/20/2014, 9:02 AM

## 2014-11-20 NOTE — Lactation Note (Signed)
This note was copied from the chart of Cheyenne Kimetha Beagley. Lactation Consultation Note  Patient Name: Cheyenne Phillips Today's Date: 11/20/2014 Reason for consult: Initial assessment Mom states that she does not want to bf. She had trouble with her 1st not liking her milk, so she "does not even want to fool with it this time". She declined pump to feed as well as latching. Discussed breast changes and breast care for the non bf mother.   Maternal Data    Feeding Feeding Type: Bottle Fed - Formula Nipple Type: Regular  LATCH Score/Interventions                      Lactation Tools Discussed/Used     Consult Status Consult Status: Complete    Cheyenne Phillips 11/20/2014, 5:23 PM

## 2014-11-20 NOTE — Anesthesia Procedure Notes (Signed)
Epidural Patient location during procedure: OB Start time: 11/20/2014 8:40 AM End time: 11/20/2014 8:47 AM  Staffing Anesthesiologist: Shona SimpsonHOLLIS, Axten Pascucci D Performed by: anesthesiologist   Preanesthetic Checklist Completed: patient identified, site marked, surgical consent, pre-op evaluation, timeout performed, IV checked, risks and benefits discussed and monitors and equipment checked  Epidural Patient position: sitting Prep: DuraPrep Patient monitoring: heart rate, continuous pulse ox and blood pressure Approach: midline Location: L4-L5 Injection technique: LOR saline  Needle:  Needle type: Tuohy  Needle gauge: 18 G Needle length: 9 cm and 9 Catheter type: closed end flexible Catheter size: 20 Guage Test dose: negative and Other  Assessment Events: blood not aspirated, injection not painful, no injection resistance, negative IV test and no paresthesia  Additional Notes LOR @ 5.5  Patient identified. Risks/Benefits/Options discussed with patient including but not limited to bleeding, infection, nerve damage, paralysis, failed block, incomplete pain control, headache, blood pressure changes, nausea, vomiting, reactions to medications, itching and postpartum back pain. Confirmed with bedside nurse the patient's most recent platelet count. Confirmed with patient that they are not currently taking any anticoagulation, have any bleeding history or any family history of bleeding disorders. Patient expressed understanding and wished to proceed. All questions were answered. Sterile technique was used throughout the entire procedure. Please see nursing notes for vital signs. Test dose was given through epidural catheter and negative prior to continuing to dose epidural or start infusion. Warning signs of high block given to the patient including shortness of breath, tingling/numbness in hands, complete motor block, or any concerning symptoms with instructions to call for help. Patient was given  instructions on fall risk and not to get out of bed. All questions and concerns addressed with instructions to call with any issues or inadequate analgesia.      Patient tolerated the insertion well without complications.Reason for block:procedure for pain

## 2014-11-20 NOTE — Anesthesia Preprocedure Evaluation (Addendum)
Anesthesia Evaluation  Patient identified by MRN, date of birth, ID band Patient awake    Reviewed: Allergy & Precautions, NPO status , Patient's Chart, lab work & pertinent test results  Airway Mallampati: I  TM Distance: >3 FB Neck ROM: Full    Dental  (+) Teeth Intact   Pulmonary former smoker,    breath sounds clear to auscultation- rhonchi       Cardiovascular negative cardio ROS   Rhythm:Regular Rate:Normal     Neuro/Psych negative neurological ROS  negative psych ROS   GI/Hepatic negative GI ROS, Neg liver ROS,   Endo/Other  negative endocrine ROS  Renal/GU negative Renal ROS  negative genitourinary   Musculoskeletal negative musculoskeletal ROS (+)   Abdominal   Peds negative pediatric ROS (+)  Hematology negative hematology ROS (+)   Anesthesia Other Findings   Reproductive/Obstetrics negative OB ROS                            Lab Results  Component Value Date   WBC 9.7 11/20/2014   HGB 12.4 11/20/2014   HCT 34.9* 11/20/2014   MCV 75.4* 11/20/2014   PLT 159 11/20/2014   No results found for: INR, PROTIME   Anesthesia Physical Anesthesia Plan  ASA: II  Anesthesia Plan: Epidural   Post-op Pain Management:    Induction:   Airway Management Planned:   Additional Equipment:   Intra-op Plan:   Post-operative Plan:   Informed Consent: I have reviewed the patients History and Physical, chart, labs and discussed the procedure including the risks, benefits and alternatives for the proposed anesthesia with the patient or authorized representative who has indicated his/her understanding and acceptance.     Plan Discussed with:   Anesthesia Plan Comments:         Anesthesia Quick Evaluation

## 2014-11-20 NOTE — Plan of Care (Signed)
Problem: Nutritional: Goal: Mother's verbalization of comfort with breastfeeding process will improve Outcome: Not Applicable Date Met:  46/96/29 Mother bottle feeding

## 2014-11-21 LAB — SURESWAB, VAGINOSIS/VAGINITIS PLUS
ATOPOBIUM VAGINAE: NOT DETECTED Log (cells/mL)
C. ALBICANS, DNA: NOT DETECTED
C. GLABRATA, DNA: NOT DETECTED
C. PARAPSILOSIS, DNA: NOT DETECTED
C. TRACHOMATIS RNA, TMA: NOT DETECTED
C. tropicalis, DNA: NOT DETECTED
Gardnerella vaginalis: NOT DETECTED Log (cells/mL)
LACTOBACILLUS SPECIES: 7.4 Log (cells/mL)
MEGASPHAERA SPECIES: NOT DETECTED Log (cells/mL)
N. gonorrhoeae RNA, TMA: NOT DETECTED
T. VAGINALIS RNA, QL TMA: NOT DETECTED

## 2014-11-21 LAB — CBC
HCT: 29.5 % — ABNORMAL LOW (ref 36.0–46.0)
Hemoglobin: 10.5 g/dL — ABNORMAL LOW (ref 12.0–15.0)
MCH: 26.8 pg (ref 26.0–34.0)
MCHC: 35.6 g/dL (ref 30.0–36.0)
MCV: 75.3 fL — AB (ref 78.0–100.0)
PLATELETS: 129 10*3/uL — AB (ref 150–400)
RBC: 3.92 MIL/uL (ref 3.87–5.11)
RDW: 15.6 % — AB (ref 11.5–15.5)
WBC: 12.6 10*3/uL — AB (ref 4.0–10.5)

## 2014-11-21 MED ORDER — FERROUS SULFATE 325 (65 FE) MG PO TABS
325.0000 mg | ORAL_TABLET | Freq: Two times a day (BID) | ORAL | Status: DC
  Administered 2014-11-21 – 2014-11-22 (×2): 325 mg via ORAL
  Filled 2014-11-21 (×2): qty 1

## 2014-11-21 NOTE — Anesthesia Postprocedure Evaluation (Signed)
Anesthesia Post Note  Patient: Cheyenne Phillips  Procedure(s) Performed: * No procedures listed *  Anesthesia type: Epidural  Patient location: Mother/Baby  Post pain: Pain level controlled  Post assessment: Post-op Vital signs reviewed  Last Vitals:  Filed Vitals:   11/21/14 0541  BP: 110/59  Pulse: 78  Temp: 36.3 C  Resp: 18    Post vital signs: Reviewed  Level of consciousness:alert  Complications: No apparent anesthesia complications

## 2014-11-21 NOTE — Progress Notes (Signed)
Post Partum Day #1 Subjective: no complaints, up ad lib, voiding and tolerating PO  Objective: Blood pressure 110/59, pulse 78, temperature 97.3 F (36.3 C), temperature source Axillary, resp. rate 18, height 5\' 6"  (1.676 m), weight 161 lb (73.029 kg), last menstrual period 02/18/2014, SpO2 99 %, unknown if currently breastfeeding.  Physical Exam:  General: alert, cooperative and no distress Lochia: appropriate Uterine Fundus: firm Incision: none DVT Evaluation: No evidence of DVT seen on physical exam. No cords or calf tenderness. No significant calf/ankle edema.   Recent Labs  11/20/14 0748 11/21/14 0525  HGB 12.4 10.5*  HCT 34.9* 29.5*    Assessment/Plan: Plan for discharge tomorrow   LOS: 1 day   Chad Donoghue A Viktor Philipp 11/21/2014, 7:59 AM

## 2014-11-22 LAB — CULTURE, BETA STREP (GROUP B ONLY)

## 2014-11-22 MED ORDER — SENNOSIDES-DOCUSATE SODIUM 8.6-50 MG PO TABS: 2.0000 | ORAL_TABLET | ORAL | Status: DC

## 2014-11-22 MED ORDER — FERIVA 21/7 75-1 MG PO TABS: 1.0000 | ORAL_TABLET | Freq: Every day | ORAL | Status: DC

## 2014-11-22 MED ORDER — IBUPROFEN 800 MG PO TABS: 800.0000 mg | ORAL_TABLET | Freq: Three times a day (TID) | ORAL | Status: DC | PRN

## 2014-11-22 MED ORDER — OXYCODONE-ACETAMINOPHEN 5-325 MG PO TABS: 2.0000 | ORAL_TABLET | ORAL | Status: DC | PRN

## 2014-11-22 NOTE — Discharge Summary (Signed)
Obstetric Discharge Summary Reason for Admission: onset of labor Prenatal Procedures: ultrasound Intrapartum Procedures: spontaneous vaginal delivery Postpartum Procedures: none Complications-Operative and Postpartum: none HEMOGLOBIN  Date Value Ref Range Status  11/21/2014 10.5* 12.0 - 15.0 g/dL Final   HCT  Date Value Ref Range Status  11/21/2014 29.5* 36.0 - 46.0 % Final    Physical Exam:  General: alert, cooperative and no distress Lochia: appropriate Uterine Fundus: firm Incision: none DVT Evaluation: No evidence of DVT seen on physical exam. No cords or calf tenderness. No significant calf/ankle edema.  Discharge Diagnoses: Term Pregnancy-delivered  Discharge Information: Date: 11/22/2014 Activity: pelvic rest Diet: routine Medications: PNV, Ibuprofen, Colace, Iron and Percocet Condition: stable Instructions: refer to practice specific booklet Discharge to: home   Newborn Data: Live born female  Birth Weight: 7 lb 1 oz (3204 g) APGAR: 9, 9  Home with mother.  Lodema Parma A Rylyn Ranganathan 11/22/2014, 8:03 AM

## 2014-11-22 NOTE — Lactation Note (Signed)
This note was copied from the chart of Cheyenne Phillips. Lactation Consultation Note  Patient Name: Cheyenne Phillips [id number]'s Date: 11/22/2014 Reason for consult: Follow-up assessment [name] continue formula/bottle feeding.   Maternal Data    Feeding    LATCH Score/Interventions                      Lactation Tools Discussed/Used     Consult Status Consult Status: Complete    Cheyenne LevinsGranger, Cheyenne Phillips 11/22/2014, 8:38 AM

## 2014-11-22 NOTE — Progress Notes (Signed)
Post Partum Day #2 Subjective: no complaints, up ad lib, voiding and tolerating PO  Objective: Blood pressure 109/61, pulse 76, temperature 97.8 F (36.6 C), temperature source Oral, resp. rate 18, height 5\' 6"  (1.676 m), weight 161 lb (73.029 kg), last menstrual period 02/18/2014, SpO2 99 %, unknown if currently breastfeeding.  Physical Exam:  General: alert, cooperative and no distress Lochia: appropriate Uterine Fundus: firm Incision: none DVT Evaluation: No evidence of DVT seen on physical exam. No cords or calf tenderness. No significant calf/ankle edema.   Recent Labs  11/20/14 0748 11/21/14 0525  HGB 12.4 10.5*  HCT 34.9* 29.5*    Assessment/Plan: Discharge home   LOS: 2 days   Rachelle A Denney 11/22/2014, 8:02 AM

## 2014-11-22 NOTE — Discharge Instructions (Signed)

## 2014-11-23 ENCOUNTER — Encounter: Payer: Medicaid Other | Admitting: Obstetrics

## 2014-11-27 ENCOUNTER — Ambulatory Visit (INDEPENDENT_AMBULATORY_CARE_PROVIDER_SITE_OTHER): Payer: Medicaid Other | Admitting: Obstetrics

## 2014-11-27 ENCOUNTER — Encounter: Payer: Self-pay | Admitting: Obstetrics

## 2014-11-27 ENCOUNTER — Encounter: Payer: Medicaid Other | Admitting: Obstetrics

## 2014-11-27 DIAGNOSIS — D649 Anemia, unspecified: Secondary | ICD-10-CM

## 2014-11-27 DIAGNOSIS — Z30013 Encounter for initial prescription of injectable contraceptive: Secondary | ICD-10-CM

## 2014-11-27 LAB — POCT URINALYSIS DIPSTICK
BILIRUBIN UA: NEGATIVE
GLUCOSE UA: NEGATIVE
KETONES UA: NEGATIVE
NITRITE UA: NEGATIVE
Protein, UA: NEGATIVE
Spec Grav, UA: 1.01
Urobilinogen, UA: NEGATIVE
pH, UA: 5

## 2014-11-27 MED ORDER — FUSION PLUS PO CAPS: 1.0000 | ORAL_CAPSULE | Freq: Every day | ORAL | Status: DC

## 2014-11-27 MED ORDER — MEDROXYPROGESTERONE ACETATE 150 MG/ML IM SUSP: 150.0000 mg | INTRAMUSCULAR | Status: DC

## 2014-11-27 NOTE — Progress Notes (Signed)
Subjective:     Cheyenne Phillips is a 24 y.o. female who presents for a postpartum visit. She is 1 week postpartum following a spontaneous vaginal delivery. I have fully reviewed the prenatal and intrapartum course. The delivery was at 39 gestational weeks. Outcome: spontaneous vaginal delivery. Anesthesia: epidural. Postpartum course has been normal. Baby's course has been normal. Baby is feeding by bottle - Similac Advance. Bleeding thin lochia. Bowel function is normal. Bladder function is normal. Patient is not sexually active. Contraception method is abstinence. Postpartum depression screening: negative.  Tobacco, alcohol and substance abuse history reviewed.  Adult immunizations reviewed including TDAP, rubella and varicella.  The following portions of the patient's history were reviewed and updated as appropriate: allergies, current medications, past family history, past medical history, past social history, past surgical history and problem list.  Review of Systems A comprehensive review of systems was negative.   Objective:    BP 120/82 mmHg  Pulse 93  Temp(Src) 99.7 F (37.6 C)  Wt 149 lb (67.586 kg)  LMP 02/18/2014  Breastfeeding? No  General:  alert and no distress   Breasts:  inspection negative, no nipple discharge or bleeding, no masses or nodularity palpable  Lungs: clear to auscultation bilaterally  Heart:  regular rate and rhythm, S1, S2 normal, no murmur, click, rub or gallop  Abdomen: soft, non-tender; bowel sounds normal; no masses,  no organomegaly   Vulva:  normal  Vagina: normal vagina  Cervix:  no cervical motion tenderness  Corpus: normal size, contour, position, consistency, mobility, non-tender  Adnexa:  no mass, fullness, tenderness  Rectal Exam: Not performed.          Assessment:     Normal postpartum exam. Pap smear not done at today's visit.     Contraceptive counseling and advice.  Wants Depo Provera.  Plan:    1. Contraception: Depo-Provera  injections 2. Depo Provera Rx 3. Follow up in: 6 weeks or as needed.   Healthy lifestyle practices reviewed

## 2014-11-29 LAB — URINE CULTURE

## 2014-12-05 ENCOUNTER — Ambulatory Visit (INDEPENDENT_AMBULATORY_CARE_PROVIDER_SITE_OTHER): Payer: Medicaid Other | Admitting: *Deleted

## 2014-12-05 VITALS — BP 113/71 | HR 77 | Wt 148.0 lb

## 2014-12-05 DIAGNOSIS — Z3202 Encounter for pregnancy test, result negative: Secondary | ICD-10-CM

## 2014-12-05 DIAGNOSIS — Z30013 Encounter for initial prescription of injectable contraceptive: Secondary | ICD-10-CM | POA: Diagnosis not present

## 2014-12-05 LAB — POCT URINE PREGNANCY: Preg Test, Ur: NEGATIVE

## 2014-12-05 MED ORDER — MEDROXYPROGESTERONE ACETATE 150 MG/ML IM SUSP
150.0000 mg | INTRAMUSCULAR | Status: AC
  Administered 2014-12-05 – 2015-03-12 (×2): 150 mg via INTRAMUSCULAR

## 2014-12-05 NOTE — Progress Notes (Signed)
Patient is post partum from delivery 11/20/2014. She has not resumed intercourse. Patient is in the office for her Depo Provera injection.

## 2015-01-08 ENCOUNTER — Ambulatory Visit (INDEPENDENT_AMBULATORY_CARE_PROVIDER_SITE_OTHER): Payer: Medicaid Other | Admitting: Obstetrics

## 2015-01-08 ENCOUNTER — Encounter: Payer: Self-pay | Admitting: Obstetrics

## 2015-01-08 DIAGNOSIS — Z3042 Encounter for surveillance of injectable contraceptive: Secondary | ICD-10-CM

## 2015-01-08 NOTE — Progress Notes (Signed)
Subjective:     Cheyenne Phillips is a 24 y.o. female who presents for a postpartum visit. She is 6 weeks postpartum following a spontaneous vaginal delivery. I have fully reviewed the prenatal and intrapartum course. The delivery was at 39 gestational weeks. Outcome: spontaneous vaginal delivery. Anesthesia: epidural. Postpartum course has been normal. Baby's course has been normal. Baby is feeding by bottle - Similac Advance. Bleeding no bleeding. Bowel function is normal. Bladder function is normal. Patient is sexually active. Contraception method is Depo-Provera injections. Postpartum depression screening: negative.  Tobacco, alcohol and substance abuse history reviewed.  Adult immunizations reviewed including TDAP, rubella and varicella.  The following portions of the patient's history were reviewed and updated as appropriate: allergies, current medications, past family history, past medical history, past social history, past surgical history and problem list.  Review of Systems A comprehensive review of systems was negative.   Objective:    BP 113/72 mmHg  Pulse 93  Wt 150 lb (68.04 kg)  General:  alert and no distress   Breasts:  inspection negative, no nipple discharge or bleeding, no masses or nodularity palpable  Lungs: clear to auscultation bilaterally  Heart:  regular rate and rhythm, S1, S2 normal, no murmur, click, rub or gallop  Abdomen: normal findings: soft, non-tender   Vulva:  normal  Vagina: normal vagina, no discharge, exudate, lesion, or erythema  Cervix:  no cervical motion tenderness  Corpus: normal size, contour, position, consistency, mobility, non-tender  Adnexa:  no mass, fullness, tenderness  Rectal Exam: Not performed.           Assessment:     Normal postpartum exam. Pap smear not done at today's visit.     Contraceptive surveillance.  Pleased with Depo Provera.  Plan:    1. Contraception: Depo-Provera injections 2. Continue PNV's 3. Follow up in:  2 months or as needed.   Healthy lifestyle practices reviewed

## 2015-02-14 ENCOUNTER — Encounter (HOSPITAL_COMMUNITY): Payer: Self-pay | Admitting: Emergency Medicine

## 2015-02-14 ENCOUNTER — Emergency Department (HOSPITAL_COMMUNITY)
Admission: EM | Admit: 2015-02-14 | Discharge: 2015-02-14 | Disposition: A | Discharge status: Home / Self Care | Payer: Medicaid Other | Attending: Emergency Medicine | Admitting: Emergency Medicine

## 2015-02-14 DIAGNOSIS — R000 Tachycardia, unspecified: Secondary | ICD-10-CM | POA: Insufficient documentation

## 2015-02-14 DIAGNOSIS — Z79899 Other long term (current) drug therapy: Secondary | ICD-10-CM | POA: Diagnosis not present

## 2015-02-14 DIAGNOSIS — J020 Streptococcal pharyngitis: Secondary | ICD-10-CM | POA: Diagnosis not present

## 2015-02-14 DIAGNOSIS — D573 Sickle-cell trait: Secondary | ICD-10-CM | POA: Diagnosis not present

## 2015-02-14 DIAGNOSIS — J029 Acute pharyngitis, unspecified: Secondary | ICD-10-CM | POA: Diagnosis present

## 2015-02-14 DIAGNOSIS — Z87891 Personal history of nicotine dependence: Secondary | ICD-10-CM | POA: Insufficient documentation

## 2015-02-14 LAB — RAPID STREP SCREEN (MED CTR MEBANE ONLY): STREPTOCOCCUS, GROUP A SCREEN (DIRECT): NEGATIVE

## 2015-02-14 MED ORDER — PENICILLIN G BENZATHINE 1200000 UNIT/2ML IM SUSP
1.2000 10*6.[IU] | Freq: Once | INTRAMUSCULAR | Status: AC
  Administered 2015-02-14: 1.2 10*6.[IU] via INTRAMUSCULAR
  Filled 2015-02-14: qty 2

## 2015-02-14 MED ORDER — HYDROCODONE-ACETAMINOPHEN 7.5-325 MG/15ML PO SOLN: 15.0000 mL | Freq: Four times a day (QID) | ORAL | Status: DC | PRN

## 2015-02-14 NOTE — ED Notes (Signed)
Pt from home with c/o sore throat. Pt states it is difficult to swallow, but has patent airway. Pt also states she has "white stuff" at the back of her throat

## 2015-02-14 NOTE — Discharge Instructions (Signed)

## 2015-02-14 NOTE — ED Provider Notes (Addendum)
CSN: [id number]     Arrival date & time 02/14/15  4098 History   First MD Initiated Contact with Patient 02/14/15 0700     Chief Complaint  Patient presents with  . Sore Throat     HPI  Patient with sore throat for last few days. Pain with swallowing. Patient states she has white spots in the back of her throat. No chest pain. States she is fatigued and hurts all over. No difficulty speaking. Past Medical History  Diagnosis Date  . Seasonal allergies   . Medical history non-contributory   . Sickle cell trait [facility])    Past Surgical History  Procedure Laterality Date  . Wisdom tooth extraction    . Cesarean section N/A 05/31/2013    Procedure: CESAREAN SECTION;  Surgeon: Brock Bad, MD;  Location: [facility]S;  Service: Obstetrics;  Laterality: N/A;   Family History  Problem Relation Age of Onset  . Hypertension Maternal Grandmother   . Hyperlipidemia Maternal Grandmother   . Arthritis Maternal Grandmother   . Asthma Maternal Grandmother    Social History  Substance Use Topics  . Smoking status: Former Smoker -- 1.00 packs/day  . Smokeless tobacco: Never Used  . Alcohol Use: No     Comment: only social in past   OB History    Gravida Para Term Preterm AB TAB SAB Ectopic Multiple Living   0 2     Review of Systems  Constitutional: Positive for fever and appetite change.  HENT: Positive for sore throat. Negative for ear discharge, sinus pressure and voice change.   Cardiovascular: Negative for chest pain.  Gastrointestinal: Negative for nausea.      Allergies  Review of patient's allergies indicates no known allergies.  Home Medications   Prior to Admission medications   Medication Sig Start Date End Date Taking? Authorizing Provider  HYDROcodone-acetaminophen (HYCET) 7.5-325 mg/15 ml solution Take 15 mLs by mouth every 6 (six) hours as needed for moderate pain. 02/14/15   Benjiman Core, MD  ibuprofen (ADVIL,MOTRIN) 800 MG tablet Take 1 tablet (800  mg total) by mouth every 8 (eight) hours as needed. 11/22/14   Rachelle A Denney, CNM  Iron-FA-B Cmp-C-Biot-Probiotic (FUSION PLUS) CAPS Take 1 capsule by mouth daily. 11/27/14   Brock Bad, MD  medroxyPROGESTERone (DEPO-PROVERA) 150 MG/ML injection Inject 1 mL (150 mg total) into the muscle every 3 (three) months. 11/27/14   Brock Bad, MD  oxyCODONE-acetaminophen (PERCOCET/ROXICET) 5-325 MG tablet Take 2 tablets by mouth every 4 (four) hours as needed (for pain scale greater than 7). Patient not taking: Reported on 11/27/2014 11/22/14   Rachelle A Denney, CNM  Prenat-FeCbn-FeAspGl-FA-Omega (OB COMPLETE PETITE) 35-5-1-200 MG CAPS Take 1 capsule by mouth daily before breakfast. 06/13/14   Brock Bad, MD  senna-docusate (SENOKOT-S) 8.6-50 MG tablet Take 2 tablets by mouth daily. Patient not taking: Reported on 11/27/2014 11/22/14   Rachelle A Denney, CNM   BP 118/83 mmHg  Pulse 102  Temp(Src) 99.1 F (37.3 C) (Oral)  Resp 18  Ht  (1.676 m)  Wt 150 lb (68.04 kg)  BMI 24.22 kg/m2  SpO2 100% Physical Exam  Constitutional: She appears well-developed.  HENT:  Head: Atraumatic.  Left sided tonsilar exudate without swelling or uvular shift.  Neck: Neck supple.  Cardiovascular:  Mild tachycardia  Pulmonary/Chest: Effort normal and breath sounds normal. No respiratory distress.  Lymphadenopathy:    She has cervical adenopathy.  Neurological:  She is alert.    ED Course  Procedures (including critical care time) Labs Review Labs Reviewed  RAPID STREP SCREEN (NOT AT [facility])    Imaging Review No results found. I have personally reviewed and evaluated these images and lab results as part of my medical decision-making.   EKG Interpretation None      MDM   Final diagnoses:  Strep throat    Patient with clinical strep throat. No apparent abscess. IM PCN and d/c with pain control    Benjiman Core, MD 02/14/15 1610  Benjiman Core, MD 03/24/15 [phone]

## 2015-02-16 LAB — CULTURE, GROUP A STREP (THRC)

## 2015-03-07 ENCOUNTER — Ambulatory Visit: Payer: Medicaid Other

## 2015-03-08 ENCOUNTER — Ambulatory Visit: Payer: Medicaid Other

## 2015-03-12 ENCOUNTER — Ambulatory Visit (INDEPENDENT_AMBULATORY_CARE_PROVIDER_SITE_OTHER): Payer: Medicaid Other | Admitting: Obstetrics

## 2015-03-12 ENCOUNTER — Encounter: Payer: Self-pay | Admitting: Obstetrics

## 2015-03-12 ENCOUNTER — Ambulatory Visit: Payer: Medicaid Other | Admitting: Obstetrics

## 2015-03-12 VITALS — BP 117/77 | HR 78 | Wt 152.0 lb

## 2015-03-12 DIAGNOSIS — Z30013 Encounter for initial prescription of injectable contraceptive: Secondary | ICD-10-CM

## 2015-03-12 DIAGNOSIS — Z1389 Encounter for screening for other disorder: Secondary | ICD-10-CM | POA: Diagnosis not present

## 2015-03-12 DIAGNOSIS — Z3202 Encounter for pregnancy test, result negative: Secondary | ICD-10-CM | POA: Diagnosis not present

## 2015-03-12 DIAGNOSIS — Z0000 Encounter for general adult medical examination without abnormal findings: Secondary | ICD-10-CM | POA: Diagnosis not present

## 2015-03-12 DIAGNOSIS — Z01419 Encounter for gynecological examination (general) (routine) without abnormal findings: Secondary | ICD-10-CM | POA: Diagnosis not present

## 2015-03-12 LAB — POCT URINALYSIS DIPSTICK
BILIRUBIN UA: NEGATIVE
Glucose, UA: NEGATIVE
Ketones, UA: NEGATIVE
Leukocytes, UA: NEGATIVE
NITRITE UA: NEGATIVE
PH UA: 8
PROTEIN UA: NEGATIVE
RBC UA: NEGATIVE
Spec Grav, UA: 1.01
Urobilinogen, UA: NEGATIVE

## 2015-03-12 LAB — POCT URINE PREGNANCY: Preg Test, Ur: NEGATIVE

## 2015-03-12 NOTE — Addendum Note (Signed)
Addended by: Henriette Combs on: 03/12/2015 01:03 PM   Modules accepted: Orders

## 2015-03-12 NOTE — Progress Notes (Signed)
Subjective:        Cheyenne Phillips is a 25 y.o. female here for a routine exam.  Current complaints: none.    Personal health questionnaire:  Is patient Cheyenne Phillips, have a family history of breast and/or ovarian cancer: no Is there a family history of uterine cancer diagnosed at age < 99, gastrointestinal cancer, urinary tract cancer, family member who is a Personnel officer syndrome-associated carrier: no Is the patient overweight and hypertensive, family history of diabetes, personal history of gestational diabetes, preeclampsia or PCOS: no Is patient over 57, have PCOS,  family history of premature CHD under age 30, diabetes, smoke, have hypertension or peripheral artery disease:  no At any time, has a partner hit, kicked or otherwise hurt or frightened you?: no Over the past 2 weeks, have you felt down, depressed or hopeless?: no Over the past 2 weeks, have you felt little interest or pleasure in doing things?:no   Gynecologic History No LMP recorded. Patient has had an injection. Contraception: Depo-Provera injections Last Pap: unknown. Results were: unknown Last mammogram: n/a. Results were: n/a  Obstetric History OB History  Gravida Para Term Preterm AB SAB TAB Ectopic Multiple Living  0 2    # Outcome Date GA Lbr Len/2nd Weight Sex Delivery Anes PTL Lv  2 Term 11/20/14 [redacted]w[redacted]d 10:08 / 00:19 7 lb 1 oz (3.204 kg) F VBAC EPI  Y  1 Term 05/31/13 [redacted]w[redacted]d  6 lb 12.8 oz (3.084 kg) F CS-LTranv EPI  Y      Past Medical History  Diagnosis Date  . Seasonal allergies   . Medical history non-contributory   . Sickle cell trait [facility])     Past Surgical History  Procedure Laterality Date  . Wisdom tooth extraction    . Cesarean section N/A 05/31/2013    Procedure: CESAREAN SECTION;  Surgeon: Brock Bad, MD;  Location: [facility];  Service: Obstetrics;  Laterality: N/A;     Current outpatient prescriptions:  .  Iron-FA-B Cmp-C-Biot-Probiotic (FUSION PLUS) CAPS, Take 1  capsule by mouth daily., Disp: 30 capsule, Rfl: 5 .  medroxyPROGESTERone (DEPO-PROVERA) 150 MG/ML injection, Inject 1 mL (150 mg total) into the muscle every 3 (three) months., Disp: 1 mL, Rfl: 3 .  Prenat-FeCbn-FeAspGl-FA-Omega (OB COMPLETE PETITE) 35-5-1-200 MG CAPS, Take 1 capsule by mouth daily before breakfast., Disp: 90 capsule, Rfl: 3 .  HYDROcodone-acetaminophen (HYCET) 7.5-325 mg/15 ml solution, Take 15 mLs by mouth every 6 (six) hours as needed for moderate pain. (Patient not taking: Reported on 03/12/2015), Disp: 120 mL, Rfl: 0  Current facility-administered medications:  .  medroxyPROGESTERone (DEPO-PROVERA) injection 150 mg, 150 mg, Intramuscular, Q90 days, Brock Bad, MD, 150 mg at 12/05/14 1631 No Known Allergies  Social History  Substance Use Topics  . Smoking status: Former Smoker -- 1.00 packs/day  . Smokeless tobacco: Never Used  . Alcohol Use: No     Comment: only social in past    Family History  Problem Relation Age of Onset  . Hypertension Maternal Grandmother   . Hyperlipidemia Maternal Grandmother   . Arthritis Maternal Grandmother   . Asthma Maternal Grandmother       Review of Systems  Constitutional: negative for fatigue and weight loss Respiratory: negative for cough and wheezing Cardiovascular: negative for chest pain, fatigue and palpitations Gastrointestinal: negative for abdominal pain and change in bowel habits Musculoskeletal:negative for myalgias Neurological: negative for gait problems and tremors Behavioral/Psych: negative for  abusive relationship, depression Endocrine: negative for temperature intolerance   Genitourinary:negative for abnormal menstrual periods, genital lesions, hot flashes, sexual problems and vaginal discharge Integument/breast: negative for breast lump, breast tenderness, nipple discharge and skin lesion(s)    Objective:       BP 117/77 mmHg  Pulse 78  Wt 152 lb (68.947 kg) General:   alert  Skin:   no rash or  abnormalities  Lungs:   clear to auscultation bilaterally  Heart:   regular rate and rhythm, S1, S2 normal, no murmur, click, rub or gallop  Breasts:   normal without suspicious masses, skin or nipple changes or axillary nodes  Abdomen:  normal findings: no organomegaly, soft, non-tender and no hernia  Pelvis:  External genitalia: normal general appearance Urinary system: urethral meatus normal and bladder without fullness, nontender Vaginal: normal without tenderness, induration or masses Cervix: normal appearance Adnexa: normal bimanual exam Uterus: anteverted and non-tender, normal size   Lab Review Urine pregnancy test Labs reviewed yes Radiologic studies reviewed no    Assessment:    Healthy female exam.    Contraceptive management   Plan:    Education reviewed: safe sex/STD prevention, self breast exams and weight bearing exercise. Contraception: Depo-Provera injections.  F/U 1 year.  No orders of the defined types were placed in this encounter.   Orders Placed This Encounter  Procedures  . SureSwab, Vaginosis/Vaginitis Plus

## 2015-03-13 LAB — PAP IG W/ RFLX HPV ASCU

## 2015-03-16 ENCOUNTER — Other Ambulatory Visit: Payer: Self-pay | Admitting: Obstetrics

## 2015-03-16 DIAGNOSIS — B9689 Other specified bacterial agents as the cause of diseases classified elsewhere: Secondary | ICD-10-CM

## 2015-03-16 DIAGNOSIS — N760 Acute vaginitis: Principal | ICD-10-CM

## 2015-03-16 LAB — SURESWAB, VAGINOSIS/VAGINITIS PLUS
Atopobium vaginae: DETECTED Log (cells/mL)
C. ALBICANS, DNA: NOT DETECTED
C. GLABRATA, DNA: NOT DETECTED
C. TRACHOMATIS RNA, TMA: NOT DETECTED
C. parapsilosis, DNA: NOT DETECTED
C. tropicalis, DNA: NOT DETECTED
LACTOBACILLUS SPECIES: NOT DETECTED Log (cells/mL)
MEGASPHAERA SPECIES: NOT DETECTED Log (cells/mL)
N. gonorrhoeae RNA, TMA: NOT DETECTED
T. VAGINALIS RNA, QL TMA: NOT DETECTED

## 2015-03-16 MED ORDER — TINIDAZOLE 500 MG PO TABS: 1000.0000 mg | ORAL_TABLET | Freq: Every day | ORAL | Status: DC

## 2015-06-11 ENCOUNTER — Ambulatory Visit: Payer: Medicaid Other

## 2015-09-05 ENCOUNTER — Encounter (HOSPITAL_COMMUNITY): Payer: Self-pay | Admitting: *Deleted

## 2015-09-05 ENCOUNTER — Emergency Department (HOSPITAL_COMMUNITY)
Admission: EM | Admit: 2015-09-05 | Discharge: 2015-09-05 | Disposition: A | Discharge status: Home / Self Care | Payer: Medicaid Other | Attending: Emergency Medicine | Admitting: Emergency Medicine

## 2015-09-05 DIAGNOSIS — Z87891 Personal history of nicotine dependence: Secondary | ICD-10-CM | POA: Insufficient documentation

## 2015-09-05 DIAGNOSIS — N898 Other specified noninflammatory disorders of vagina: Secondary | ICD-10-CM | POA: Insufficient documentation

## 2015-09-05 LAB — WET PREP, GENITAL
Sperm: NONE SEEN
Trich, Wet Prep: NONE SEEN
Yeast Wet Prep HPF POC: NONE SEEN

## 2015-09-05 LAB — I-STAT BETA HCG BLOOD, ED (MC, WL, AP ONLY)

## 2015-09-05 MED ORDER — AZITHROMYCIN 250 MG PO TABS
1000.0000 mg | ORAL_TABLET | Freq: Once | ORAL | Status: AC
  Administered 2015-09-05: 1000 mg via ORAL
  Filled 2015-09-05: qty 4

## 2015-09-05 MED ORDER — CEFTRIAXONE SODIUM 250 MG IJ SOLR
250.0000 mg | Freq: Once | INTRAMUSCULAR | Status: AC
  Administered 2015-09-05: 250 mg via INTRAMUSCULAR
  Filled 2015-09-05 (×2): qty 250

## 2015-09-05 NOTE — ED Triage Notes (Signed)
Pt reports having abnormal vaginal discharge and odor.

## 2015-09-05 NOTE — Discharge Instructions (Signed)
Return here as needed.  Follow-up with the clinic provided.  °

## 2015-09-05 NOTE — ED Provider Notes (Signed)
[facility]-EMERGENCY DEPT Provider Note   CSN: [id number] Arrival date & time: 09/05/15  1309     History   Chief Complaint Chief Complaint  Patient presents with  . Vaginal Discharge    HPI Cheyenne Phillips is a 25 y.o. female.  HPI Patient presents to the emergency department with vaginal discharge for the last week.  The patient states that she tried to take some Monistat over-the-counter without relief of her symptoms.  She states that that she has had no bleeding vaginally.  She states that nothing seems make the condition better or worse.  Patient states that she has had a sexually transmitted disease in the past.  She did have unprotected sex about 10 days ago Past Medical History:  Diagnosis Date  . Medical history non-contributory   . Seasonal allergies   . Sickle cell trait [facility](HCC)     Patient Active Problem List   Diagnosis Date Noted  . Active labor at term 11/20/2014  . NSVD (normal spontaneous vaginal delivery) 11/20/2014  . [redacted] weeks gestation of pregnancy   . Evaluate anatomy not seen on prior sonogram   . History of cesarean section complicating pregnancy   . Hereditary disease in family possibly affecting fetus, antepartum   . Encounter for fetal anatomic survey   . [redacted] weeks gestation of pregnancy   . S/P cesarean section 05/31/2013  . Abnormal hemoglobin (HCC) 05/30/2013  . Post term pregnancy over 40 weeks 05/30/2013    Past Surgical History:  Procedure Laterality Date  . CESAREAN SECTION N/A 05/31/2013   Procedure: CESAREAN SECTION;  Surgeon: Brock Badharles A Harper, MD;  Location: [facility]S;  Service: Obstetrics;  Laterality: N/A;  . WISDOM TOOTH EXTRACTION      OB History    Gravida Para Term Preterm AB Living   2 2 2     2    SAB TAB Ectopic Multiple Live Births         0 2       Home Medications    Prior to Admission medications   Medication Sig Start Date End Date Taking? Authorizing Provider  HYDROcodone-acetaminophen (HYCET) 7.5-325 mg/15 ml  solution Take 15 mLs by mouth every 6 (six) hours as needed for moderate pain. Patient not taking: Reported on 03/12/2015 02/14/15   Benjiman CoreNathan Pickering, MD  Iron-FA-B Cmp-C-Biot-Probiotic (FUSION PLUS) CAPS Take 1 capsule by mouth daily. 11/27/14   Brock Badharles A Harper, MD  medroxyPROGESTERone (DEPO-PROVERA) 150 MG/ML injection Inject 1 mL (150 mg total) into the muscle every 3 (three) months. 11/27/14   Brock Badharles A Harper, MD  Prenat-FeCbn-FeAspGl-FA-Omega (OB COMPLETE PETITE) 35-5-1-200 MG CAPS Take 1 capsule by mouth daily before breakfast. 06/13/14   Brock Badharles A Harper, MD  tinidazole [facility](TINDAMAX) 500 MG tablet Take 2 tablets (1,000 mg total) by mouth daily with breakfast. 03/16/15   Brock Badharles A Harper, MD    Family History Family History  Problem Relation Age of Onset  . Hypertension Maternal Grandmother   . Hyperlipidemia Maternal Grandmother   . Arthritis Maternal Grandmother   . Asthma Maternal Grandmother     Social History Social History  Substance Use Topics  . Smoking status: Former Smoker    Packs/day: 1.00  . Smokeless tobacco: Never Used  . Alcohol use No     Comment: only social in past     Allergies   Review of patient's allergies indicates no known allergies.   Review of Systems Review of Systems  All other systems negative except as documented in  the HPI. All pertinent positives and negatives as reviewed in the HPI. Physical Exam Updated Vital Signs BP 107/62 (BP Location: Right Arm)   Pulse 73   Temp 97.3 F (36.3 C) (Oral)   Resp 16   SpO2 100%   Physical Exam  Constitutional: She is oriented to person, place, and time. She appears well-developed and well-nourished. No distress.  HENT:  Head: Normocephalic and atraumatic.  Mouth/Throat: Oropharynx is clear and moist.  Eyes: Pupils are equal, round, and reactive to light.  Neck: Normal range of motion. Neck supple.  Cardiovascular: Normal rate, regular rhythm and normal heart sounds.  Exam reveals no gallop and no  friction rub.   No murmur heard. Pulmonary/Chest: Effort normal and breath sounds normal. No respiratory distress. She has no wheezes.  Abdominal: Soft. Bowel sounds are normal. She exhibits no distension. There is no tenderness.  Genitourinary: Pelvic exam was performed with patient supine. There is no rash, tenderness or lesion on the right labia. There is no rash, tenderness or lesion on the left labia. Cervix exhibits discharge. Cervix exhibits no motion tenderness and no friability. Right adnexum displays no mass and no tenderness. Left adnexum displays no mass and no tenderness. No erythema, tenderness or bleeding in the vagina. No foreign body in the vagina. No signs of injury around the vagina. Vaginal discharge found.  Neurological: She is alert and oriented to person, place, and time. She exhibits normal muscle tone. Coordination normal.  Skin: Skin is warm and dry. No rash noted. No erythema.  Psychiatric: She has a normal mood and affect. Her behavior is normal.  Nursing note and vitals reviewed.    ED Treatments / Results  Labs (all labs ordered are listed, but only abnormal results are displayed) Labs Reviewed  WET PREP, GENITAL  I-STAT BETA HCG BLOOD, ED ([facility], [facility], [facility] ONLY)  GC/CHLAMYDIA PROBE AMP ([place]) NOT AT [facility]    EKG  EKG Interpretation None       Radiology No results found.  Procedures Procedures (including critical care time)  Medications Ordered in ED Medications - No data to display   Initial Impression / Assessment and Plan / ED Course  I have reviewed the triage vital signs and the nursing notes.  Pertinent labs & imaging results that were available during my care of the patient were reviewed by me and considered in my medical decision making (see chart for details).  Clinical Course    Patient will be treated for sexually transmitted diseases.  I advised the patient of the plan and all questions were answered Final Clinical  Impressions(s) / ED Diagnoses   Final diagnoses:  None    New Prescriptions New Prescriptions   No medications on file     Charlestine NightChristopher Kalana Yust, PA-C 09/05/15 1924    Doug SouSam Jacubowitz, MD 09/05/15 2354

## 2015-09-06 LAB — GC/CHLAMYDIA PROBE AMP (~~LOC~~) NOT AT ARMC
Chlamydia: NEGATIVE
Neisseria Gonorrhea: NEGATIVE

## 2015-09-13 ENCOUNTER — Telehealth (HOSPITAL_BASED_OUTPATIENT_CLINIC_OR_DEPARTMENT_OTHER): Payer: Self-pay | Admitting: Emergency Medicine

## 2015-11-04 ENCOUNTER — Encounter (HOSPITAL_COMMUNITY): Payer: Self-pay | Admitting: Emergency Medicine

## 2015-11-04 ENCOUNTER — Emergency Department (HOSPITAL_COMMUNITY)
Admission: EM | Admit: 2015-11-04 | Discharge: 2015-11-04 | Disposition: A | Discharge status: Home / Self Care | Payer: Medicaid Other | Attending: Emergency Medicine | Admitting: Emergency Medicine

## 2015-11-04 DIAGNOSIS — Z87891 Personal history of nicotine dependence: Secondary | ICD-10-CM | POA: Insufficient documentation

## 2015-11-04 DIAGNOSIS — R21 Rash and other nonspecific skin eruption: Secondary | ICD-10-CM | POA: Insufficient documentation

## 2015-11-04 DIAGNOSIS — Z79899 Other long term (current) drug therapy: Secondary | ICD-10-CM | POA: Insufficient documentation

## 2015-11-04 MED ORDER — CLOTRIMAZOLE-BETAMETHASONE 1-0.05 % EX CREA: TOPICAL_CREAM | CUTANEOUS | 0 refills | Status: DC

## 2015-11-04 NOTE — ED Triage Notes (Signed)
Pt reports rash to arms, chest and legs that has been ongoing for the last week. No new detergents, lotions, or soaps per pt.

## 2015-11-04 NOTE — ED Provider Notes (Signed)
[facility]-EMERGENCY DEPT Provider Note   CSN: [id number] Arrival date & time: 11/04/15  [id number]     History   Chief Complaint Chief Complaint  Patient presents with  . Rash    HPI Cheyenne Phillips is a 25 y.o. female.  HPI Patient presents to the emergency department with a rash that has been present over the last week.  Patient, states she has noticed several spots that come up on her scan.  She states that it may have been present for longer, but she has noticed it getting worse over the last week.  Patient states that she does not have any itching and noted no new exposures to soaps, lotions, detergents or other chemicals.  Patient states that she did not take any medications prior to arrival for her symptoms.  She states that nothing seems to make the condition better or worseThe patient denies chest pain, shortness of breath, headache,blurred vision, neck pain, fever, cough, weakness, numbness, dizziness, anorexia, edema, abdominal pain, nausea, vomiting, diarrhea, rash, back pain, dysuria, hematemesis, bloody stool, near syncope, or syncope. Past Medical History:  Diagnosis Date  . Medical history non-contributory   . Seasonal allergies   . Sickle cell trait [facility](HCC)     Patient Active Problem List   Diagnosis Date Noted  . Active labor at term 11/20/2014  . NSVD (normal spontaneous vaginal delivery) 11/20/2014  . [redacted] weeks gestation of pregnancy   . Evaluate anatomy not seen on prior sonogram   . History of cesarean section complicating pregnancy   . Hereditary disease in family possibly affecting fetus, antepartum   . Encounter for fetal anatomic survey   . [redacted] weeks gestation of pregnancy   . S/P cesarean section 05/31/2013  . Abnormal hemoglobin (HCC) 05/30/2013  . Post term pregnancy over 40 weeks 05/30/2013    Past Surgical History:  Procedure Laterality Date  . CESAREAN SECTION N/A 05/31/2013   Procedure: CESAREAN SECTION;  Surgeon: Brock Badharles A Harper, MD;  Location: [facility]  ORS;  Service: Obstetrics;  Laterality: N/A;  . WISDOM TOOTH EXTRACTION      OB History    Gravida Para Term Preterm AB Living   2 2 2     2    SAB TAB Ectopic Multiple Live Births         0 2       Home Medications    Prior to Admission medications   Medication Sig Start Date End Date Taking? Authorizing Provider  HYDROcodone-acetaminophen (HYCET) 7.5-325 mg/15 ml solution Take 15 mLs by mouth every 6 (six) hours as needed for moderate pain. Patient not taking: Reported on 03/12/2015 02/14/15   Benjiman CoreNathan Pickering, MD  Iron-FA-B Cmp-C-Biot-Probiotic (FUSION PLUS) CAPS Take 1 capsule by mouth daily. 11/27/14   Brock Badharles A Harper, MD  medroxyPROGESTERone (DEPO-PROVERA) 150 MG/ML injection Inject 1 mL (150 mg total) into the muscle every 3 (three) months. 11/27/14   Brock Badharles A Harper, MD  Prenat-FeCbn-FeAspGl-FA-Omega (OB COMPLETE PETITE) 35-5-1-200 MG CAPS Take 1 capsule by mouth daily before breakfast. 06/13/14   Brock Badharles A Harper, MD  tinidazole [facility](TINDAMAX) 500 MG tablet Take 2 tablets (1,000 mg total) by mouth daily with breakfast. 03/16/15   Brock Badharles A Harper, MD    Family History Family History  Problem Relation Age of Onset  . Hypertension Maternal Grandmother   . Hyperlipidemia Maternal Grandmother   . Arthritis Maternal Grandmother   . Asthma Maternal Grandmother     Social History Social History  Substance Use Topics  . Smoking  status: Former Smoker    Packs/day: 1.00  . Smokeless tobacco: Never Used  . Alcohol use No     Comment: only social in past     Allergies   Review of patient's allergies indicates no known allergies.   Review of Systems Review of Systems All other systems negative except as documented in the HPI. All pertinent positives and negatives as reviewed in the HPI.  Physical Exam Updated Vital Signs BP 152/68 (BP Location: Left Arm)   Pulse 64   Temp 98.6 F (37 C) (Oral)   Resp 17   Ht 5\' 7"  (1.702 m)   Wt 63.5 kg   LMP 10/21/2015   SpO2 99%    BMI 21.93 kg/m   Physical Exam  Constitutional: She is oriented to person, place, and time. She appears well-developed and well-nourished.  Eyes: Pupils are equal, round, and reactive to light.  Neck: Normal range of motion. Neck supple.  Cardiovascular: Normal rate and regular rhythm.   Pulmonary/Chest: Effort normal and breath sounds normal. No respiratory distress.  Neurological: She is alert and oriented to person, place, and time.  Skin: Skin is warm. Rash noted.        ED Treatments / Results  Labs (all labs ordered are listed, but only abnormal results are displayed) Labs Reviewed - No data to display  EKG  EKG Interpretation None       Radiology No results found.  Procedures Procedures (including critical care time)  Medications Ordered in ED Medications - No data to display   Initial Impression / Assessment and Plan / ED Course  I have reviewed the triage vital signs and the nursing notes.  Pertinent labs & imaging results that were available during my care of the patient were reviewed by me and considered in my medical decision making (see chart for details).  Clinical Course    I will give the patient is a for this rash could appear as ringworm based on the fact that has a breathing pattern with a central coloration variance.  The patient follow-up with primary care Dr. told to return here as needed  Final Clinical Impressions(s) / ED Diagnoses   Final diagnoses:  None    New Prescriptions New Prescriptions   No medications on file     Charlestine Night, PA-C 11/04/15 1610    Bethann Berkshire, MD 11/06/15 1224

## 2015-11-04 NOTE — Discharge Instructions (Signed)
Return here as needed.  Follow up with a primary care doctor °

## 2016-03-12 ENCOUNTER — Ambulatory Visit: Payer: Self-pay | Admitting: Obstetrics

## 2016-03-21 ENCOUNTER — Inpatient Hospital Stay (HOSPITAL_COMMUNITY)
Admission: AD | Admit: 2016-03-21 | Discharge: 2016-03-21 | Disposition: A | Discharge status: Home / Self Care | Payer: Self-pay | Source: Ambulatory Visit | Attending: Obstetrics & Gynecology | Admitting: Obstetrics & Gynecology

## 2016-03-21 ENCOUNTER — Emergency Department (HOSPITAL_COMMUNITY)
Admission: EM | Admit: 2016-03-21 | Discharge: 2016-03-21 | Disposition: A | Discharge status: Home / Self Care | Payer: Self-pay

## 2016-03-21 DIAGNOSIS — N912 Amenorrhea, unspecified: Secondary | ICD-10-CM | POA: Insufficient documentation

## 2016-03-21 NOTE — MAU Provider Note (Signed)
Ms.Ketzia P Griffitts is a 25 y.o. G2P2002 at Unknown who presents to MAU 22today for pregnancy verification. The patient denies abdominal pain or vaginal bleeding today.   BP 111/55 (BP Location: Right Arm)   Pulse 86   Temp 98 F (36.7 C)   Resp 18   CONSTITUTIONAL: Well-developed, well-nourished female in no acute distress.  MUSCULOSKELETAL: Normal range of motion.  CARDIOVASCULAR: Regular heart rate RESPIRATORY: Normal effort NEUROLOGICAL: Alert and oriented to person, place, and time.  SKIN: No rash noted. Not diaphoretic. No erythema. No pallor. PSYCH: Normal mood and affect. Normal behavior. Normal judgment and thought content.  No results found for this or any previous visit (from the past 24 hour(s)).  MDM Patient advised that without concerning symptoms today UPT will not be performed in MAU at this time  A: Amenorrhea  P: Discharge home Pt advised that [facility] Monday- Thursday 8-4 and Friday 8-11  Patient may return to MAU as needed or if her condition were to change or worsen   Donette LarryMelanie Tahliyah Anagnos, CNM  03/21/2016 3:38 PM

## 2016-03-21 NOTE — MAU Note (Signed)
Pt presents to MAU for a confirmation of pregnancy. Pt states she had a +hpt. Denies any other issues.

## 2016-03-24 ENCOUNTER — Encounter: Payer: Self-pay | Admitting: Family Medicine

## 2016-03-24 ENCOUNTER — Ambulatory Visit (INDEPENDENT_AMBULATORY_CARE_PROVIDER_SITE_OTHER): Payer: Self-pay

## 2016-03-24 DIAGNOSIS — Z3201 Encounter for pregnancy test, result positive: Secondary | ICD-10-CM

## 2016-03-24 DIAGNOSIS — Z3200 Encounter for pregnancy test, result unknown: Secondary | ICD-10-CM

## 2016-03-24 LAB — POCT PREGNANCY, URINE: PREG TEST UR: POSITIVE — AB

## 2016-03-24 NOTE — Progress Notes (Signed)
Patient presented to office today for a pregnancy test. Test confirms she is pregnant at this time. Patient is unsure of last cycle. Medications have been reviewed at this time. Patient was advised to start taking prenatal vitamins.

## 2017-09-08 ENCOUNTER — Emergency Department (HOSPITAL_COMMUNITY)
Admission: EM | Admit: 2017-09-08 | Discharge: 2017-09-08 | Disposition: A | Discharge status: Home / Self Care | Payer: Self-pay | Attending: Emergency Medicine | Admitting: Emergency Medicine

## 2017-09-08 ENCOUNTER — Encounter (HOSPITAL_COMMUNITY): Payer: Self-pay | Admitting: Emergency Medicine

## 2017-09-08 DIAGNOSIS — Z5321 Procedure and treatment not carried out due to patient leaving prior to being seen by health care provider: Secondary | ICD-10-CM | POA: Insufficient documentation

## 2017-09-08 DIAGNOSIS — H109 Unspecified conjunctivitis: Secondary | ICD-10-CM | POA: Insufficient documentation

## 2017-09-08 NOTE — ED Triage Notes (Signed)
Pt c/o right eye drainage and redness in right eye since she woke up this morning.  Vaginal discharge and odor for weeks. Reports took her medications for BV and still having symptoms. Went to urgent care and was told that her body was immued to antibiotics and prescribed pt something stronger.

## 2017-09-08 NOTE — ED Notes (Signed)
Per registration, patient reports they are leaving. 

## 2017-09-10 NOTE — ED Notes (Signed)
Follow up call made  No answer  09/10/17  1201  s Mardi Cannady rn

## 2017-10-12 ENCOUNTER — Encounter: Payer: Self-pay | Admitting: Obstetrics & Gynecology

## 2017-10-12 ENCOUNTER — Other Ambulatory Visit: Payer: Self-pay

## 2017-10-12 ENCOUNTER — Other Ambulatory Visit: Payer: Self-pay | Admitting: Obstetrics & Gynecology

## 2017-10-12 ENCOUNTER — Ambulatory Visit (INDEPENDENT_AMBULATORY_CARE_PROVIDER_SITE_OTHER): Payer: BLUE CROSS/BLUE SHIELD | Admitting: Obstetrics & Gynecology

## 2017-10-12 VITALS — BP 106/63 | HR 84 | Ht 66.0 in | Wt 160.0 lb

## 2017-10-12 DIAGNOSIS — Z30013 Encounter for initial prescription of injectable contraceptive: Secondary | ICD-10-CM

## 2017-10-12 DIAGNOSIS — Z3042 Encounter for surveillance of injectable contraceptive: Secondary | ICD-10-CM

## 2017-10-12 DIAGNOSIS — Z01419 Encounter for gynecological examination (general) (routine) without abnormal findings: Secondary | ICD-10-CM | POA: Diagnosis not present

## 2017-10-12 DIAGNOSIS — N760 Acute vaginitis: Secondary | ICD-10-CM

## 2017-10-12 DIAGNOSIS — B9689 Other specified bacterial agents as the cause of diseases classified elsewhere: Secondary | ICD-10-CM

## 2017-10-12 MED ORDER — MEDROXYPROGESTERONE ACETATE 150 MG/ML IM SUSP: 150.0000 mg | INTRAMUSCULAR | 3 refills | Status: DC

## 2017-10-12 NOTE — Progress Notes (Signed)
Patient ID: Cheyenne Phillips, female   DOB: 04/18/1990, 27 y.o.   MRN: [id number]  Chief Complaint  Patient presents with  . Gynecologic Exam    New GYN  . Vaginal Discharge    HPI Cheyenne Phillips is a 27 y.o. female.  [id number] No LMP recorded (lmp unknown). Patient has had an injection. She returns for annual exam and requests testing for STD and BV. She has been treated for BV several times and notes vaginal discharge and odor. HPI  Past Medical History:  Diagnosis Date  . Medical history non-contributory   . Seasonal allergies   . Sickle cell trait [facility](HCC)     Past Surgical History:  Procedure Laterality Date  . CESAREAN SECTION N/A 05/31/2013   Procedure: CESAREAN SECTION;  Surgeon: Brock Badharles A Harper, MD;  Location: [facility] ORS;  Service: Obstetrics;  Laterality: N/A;  . WISDOM TOOTH EXTRACTION      Family History  Problem Relation Age of Onset  . Hypertension Maternal Grandmother   . Hyperlipidemia Maternal Grandmother   . Arthritis Maternal Grandmother   . Asthma Maternal Grandmother     Social History Social History   Tobacco Use  . Smoking status: Former Smoker    Packs/day: 1.00  . Smokeless tobacco: Never Used  Substance Use Topics  . Alcohol use: Yes    Alcohol/week: 0.0 standard drinks    Comment: only social   . Drug use: No    No Known Allergies  Current Outpatient Medications  Medication Sig Dispense Refill  . medroxyPROGESTERone (DEPO-PROVERA) 150 MG/ML injection Inject 1 mL (150 mg total) into the muscle every 3 (three) months. 1 mL 3  . clotrimazole-betamethasone (LOTRISONE) cream Apply to affected area 2 times daily prn (Patient not taking: Reported on 03/24/2016) 45 g 0  . HYDROcodone-acetaminophen (HYCET) 7.5-325 mg/15 ml solution Take 15 mLs by mouth every 6 (six) hours as needed for moderate pain. (Patient not taking: Reported on 03/12/2015) 120 mL 0  . Iron-FA-B Cmp-C-Biot-Probiotic (FUSION PLUS) CAPS Take 1 capsule by mouth daily. (Patient  not taking: Reported on 03/24/2016) 30 capsule 5  . Prenat-FeCbn-FeAspGl-FA-Omega (OB COMPLETE PETITE) 35-5-1-200 MG CAPS Take 1 capsule by mouth daily before breakfast. (Patient not taking: Reported on 03/24/2016) 90 capsule 3  . tinidazole (TINDAMAX) 500 MG tablet Take 2 tablets (1,000 mg total) by mouth daily with breakfast. (Patient not taking: Reported on 03/24/2016) 10 tablet 2   No current facility-administered medications for this visit.     Review of Systems Review of Systems  Constitutional: Negative.   Respiratory: Negative.   Cardiovascular: Negative.   Gastrointestinal: Negative.   Genitourinary: Positive for vaginal discharge.  Musculoskeletal: Negative.     Blood pressure 106/63, pulse 84, height 5\' 6"  (1.676 m), weight 160 lb (72.6 kg).  Physical Exam Physical Exam  Constitutional: She is oriented to person, place, and time. She appears well-developed. No distress.  Neck: Normal range of motion.  Cardiovascular: Normal rate and regular rhythm.  Pulmonary/Chest: Effort normal and breath sounds normal.  Abdominal: Soft. There is no tenderness.  Genitourinary: Vagina normal and uterus normal. No vaginal discharge found.  Neurological: She is alert and oriented to person, place, and time.  Skin: Skin is warm and dry.  Vitals reviewed. Breasts: breasts appear normal, no suspicious masses, no skin or nipple changes or axillary nodes.  Data Reviewed Pap result normal  Assessment    Well woman exam normal, pap done   Frequent BV per report Plan  Depo provera 2 months Yearly exam Result of STD testing pending       Scheryl Darter 10/12/2017, 9:15 AM

## 2017-10-12 NOTE — Patient Instructions (Signed)
Hormonal Contraception Information °Hormonal contraception is a type of birth control that uses hormones to prevent pregnancy. It usually involves a combination of the hormones estrogen and progesterone or only the hormone progesterone. Hormonal contraception works in these ways: °· It thickens the mucus in the cervix, making it harder for sperm to enter the uterus. °· It changes the lining of the uterus, making it harder for an egg to implant. °· It may stop the ovaries from releasing eggs (ovulation). Some women who take hormonal contraceptives that contain only progesterone may continue to ovulate. ° °Hormonal contraception cannot prevent sexually transmitted infections (STIs). Pregnancy may still occur. °Estrogen and progesterone contraceptives °Contraceptives that use a combination of estrogen and progesterone are available in these forms: °· Pill. Pills come in different combinations of hormones. They must be taken at the same time each day. Pills can affect your period, causing you to get your period once every three months or not at all. °· Patch. The patch must be worn on the lower abdomen for three weeks and then removed on the fourth. °· Vaginal ring. The ring is placed in the vagina and left there for three weeks. It is then removed for one week. ° °Progesterone contraceptives °Contraceptives that use progesterone only are available in these forms: °· Pill. Pills should be taken every day of the cycle. °· Intrauterine device (IUD). This device is inserted into the uterus and removed or replaced every five years or sooner. °· Implant. Plastic rods are placed under the skin of the upper arm. They are removed or replaced every three years or sooner. °· Injection. The injection is given once every 90 days. ° °What are the side effects? °The side effects of estrogen and progesterone contraceptives include: °· Nausea. °· Headaches. °· Breast tenderness. °· Bleeding or spotting between menstrual cycles. °· High  blood pressure (rare). °· Strokes, heart attacks, or blood clots (rare) ° °Side effects of progesterone-only contraceptives include: °· Nausea. °· Headaches. °· Breast tenderness. °· Unpredictable menstrual bleeding. °· High blood pressure (rare). ° °Talk to your health care provider about what side effects may affect you. °Where to find more information: °· Ask your health care provider for more information and resources about hormonal contraception. °· U.[place] Department of Health and Human Services Office on Women's Health: www.womenshealth.gov °Questions to ask: °· What type of hormonal contraception is right for me? °· How long should I plan to use hormonal contraception? °· What are the side effects of the hormonal contraception method I choose? °· How can I prevent STIs while using hormonal contraception? °Contact a health care provider if: °· You start taking hormonal contraceptives and you develop persistent or severe side effects. °Summary °· Estrogen and progesterone are hormones used in many forms of birth control. °· Talk to your health care provider about what side effects may affect you. °· Hormonal contraception cannot prevent sexually transmitted infections (STIs). °· Ask your health care provider for more information and resources about hormonal contraception. °This information is not intended to replace advice given to you by your health care provider. Make sure you discuss any questions you have with your health care provider. °Document Released: 01/25/2007 Document Revised: 12/06/2015 Document Reviewed: 12/06/2015 °Elsevier Interactive Patient Education © 2018 [employer] Inc. ° °

## 2017-10-12 NOTE — Progress Notes (Signed)
New GYN presents for AEX/PAP/STD Testing.  She is on DEPO.  Malodorous, creamy discharge x 2+ months.

## 2017-10-13 LAB — HEPATITIS C ANTIBODY
Hepatitis C Ab: NONREACTIVE
SIGNAL TO CUT-OFF: 0.02 (ref <1.00)

## 2017-10-13 LAB — HIV ANTIBODY (ROUTINE TESTING W REFLEX): HIV: NONREACTIVE

## 2017-10-13 LAB — PAP IG W/ RFLX HPV ASCU

## 2017-10-13 LAB — HEPATITIS B SURFACE ANTIBODY,QUALITATIVE: Hep B S Ab: NONREACTIVE

## 2017-10-13 LAB — RPR: RPR Ser Ql: NONREACTIVE

## 2017-10-14 LAB — SURESWAB, VAGINOSIS/VAGINITIS PLUS
Atopobium vaginae: 6.6 Log (cells/mL)
C. GLABRATA, DNA: NOT DETECTED
C. PARAPSILOSIS, DNA: NOT DETECTED
C. TROPICALIS, DNA: NOT DETECTED
C. albicans, DNA: NOT DETECTED
C. trachomatis RNA, TMA: NOT DETECTED
GARDNERELLA VAGINALIS: 8 Log (cells/mL)
LACTOBACILLUS SPECIES: NOT DETECTED
MEGASPHAERA SPECIES: 7.5 Log (cells/mL)
N. gonorrhoeae RNA, TMA: NOT DETECTED
TRICHOMONAS VAGINALIS RNA: NOT DETECTED

## 2017-10-15 MED ORDER — TINIDAZOLE 500 MG PO TABS: 1000.0000 mg | ORAL_TABLET | Freq: Every day | ORAL | 2 refills | Status: DC

## 2017-10-15 NOTE — Addendum Note (Signed)
Addended by: Adam Phenix on: 10/15/2017 03:13 PM   Modules accepted: Orders

## 2017-10-18 ENCOUNTER — Telehealth: Payer: Self-pay

## 2017-10-18 NOTE — Telephone Encounter (Signed)
S/w pt and advised of results and rx sent by provider. 

## 2018-02-16 ENCOUNTER — Encounter: Payer: Self-pay | Admitting: Obstetrics and Gynecology

## 2018-02-16 ENCOUNTER — Ambulatory Visit (INDEPENDENT_AMBULATORY_CARE_PROVIDER_SITE_OTHER): Payer: BLUE CROSS/BLUE SHIELD | Admitting: Obstetrics and Gynecology

## 2018-02-16 VITALS — BP 115/73 | HR 83 | Ht 66.0 in | Wt 159.2 lb

## 2018-02-16 DIAGNOSIS — Z30013 Encounter for initial prescription of injectable contraceptive: Secondary | ICD-10-CM | POA: Diagnosis not present

## 2018-02-16 DIAGNOSIS — Z3042 Encounter for surveillance of injectable contraceptive: Secondary | ICD-10-CM

## 2018-02-16 DIAGNOSIS — Z3202 Encounter for pregnancy test, result negative: Secondary | ICD-10-CM | POA: Diagnosis not present

## 2018-02-16 DIAGNOSIS — Z113 Encounter for screening for infections with a predominantly sexual mode of transmission: Secondary | ICD-10-CM

## 2018-02-16 DIAGNOSIS — B9689 Other specified bacterial agents as the cause of diseases classified elsewhere: Secondary | ICD-10-CM

## 2018-02-16 DIAGNOSIS — N898 Other specified noninflammatory disorders of vagina: Secondary | ICD-10-CM | POA: Diagnosis not present

## 2018-02-16 DIAGNOSIS — N760 Acute vaginitis: Secondary | ICD-10-CM | POA: Diagnosis not present

## 2018-02-16 LAB — POCT URINE PREGNANCY: Preg Test, Ur: NEGATIVE

## 2018-02-16 MED ORDER — MEDROXYPROGESTERONE ACETATE 150 MG/ML IM SUSP: 150.0000 mg | INTRAMUSCULAR | 3 refills | Status: DC

## 2018-02-16 NOTE — Progress Notes (Signed)
Pt presents for vaginal discharge took Flagyl last week. Symptoms subsided but she has questions about reoccurrence.  Pt requests to restart Depo. Last unprotected IC, 2 days ago.

## 2018-02-16 NOTE — Progress Notes (Signed)
Patient ID: Cheyenne Phillips, female   DOB: May 01, 1990, 28 y.o.   MRN: [id number] [name] presents with desire to restart Depo Provera.' Has used in the past stopped last fall. Cycles irregular since stopping Sexual active, same partner Last IC 2 days ago.  She also is concerned about recurrent BV infections.  Last infection was 1-2 weeks ago. Self Dx and treated,  Sx have now resolved.  She reports 2 episodes of BV last yr. No association with IC. Possible with cycle but has not paid attention to this.   PE AF VSS Lungs clear Heart RRR Abd soft + BS  A/P Vaginal discharge        Contraception/Depo Provera  UPT negative. Pt instructed to reframe from IC x 2 weeks. Rx for Depo Provera sent to pharmacy. Pt to return in 2 weeks for nurse visit and injection.  BV reviewed with pt. Will follow once Depo Provera has been restarted.

## 2018-02-16 NOTE — Patient Instructions (Signed)

## 2018-02-16 NOTE — Addendum Note (Signed)
Addended by: Dalphine Handing on: 02/16/2018 10:31 AM   Modules accepted: Orders

## 2018-02-17 LAB — CERVICOVAGINAL ANCILLARY ONLY
Bacterial vaginitis: POSITIVE — AB
CANDIDA VAGINITIS: NEGATIVE
CHLAMYDIA, DNA PROBE: NEGATIVE
Neisseria Gonorrhea: NEGATIVE
Trichomonas: NEGATIVE

## 2018-02-21 ENCOUNTER — Other Ambulatory Visit: Payer: Self-pay

## 2018-02-21 DIAGNOSIS — B9689 Other specified bacterial agents as the cause of diseases classified elsewhere: Secondary | ICD-10-CM

## 2018-02-21 DIAGNOSIS — N760 Acute vaginitis: Principal | ICD-10-CM

## 2018-02-21 MED ORDER — METRONIDAZOLE 500 MG PO TABS: 500.0000 mg | ORAL_TABLET | Freq: Two times a day (BID) | ORAL | 0 refills | Status: DC

## 2018-03-02 ENCOUNTER — Ambulatory Visit (INDEPENDENT_AMBULATORY_CARE_PROVIDER_SITE_OTHER): Payer: BLUE CROSS/BLUE SHIELD

## 2018-03-02 DIAGNOSIS — Z3202 Encounter for pregnancy test, result negative: Secondary | ICD-10-CM

## 2018-03-02 DIAGNOSIS — Z3042 Encounter for surveillance of injectable contraceptive: Secondary | ICD-10-CM

## 2018-03-02 DIAGNOSIS — Z30013 Encounter for initial prescription of injectable contraceptive: Secondary | ICD-10-CM

## 2018-03-02 LAB — POCT URINE PREGNANCY: PREG TEST UR: NEGATIVE

## 2018-03-02 MED ORDER — MEDROXYPROGESTERONE ACETATE 150 MG/ML IM SUSP
150.0000 mg | INTRAMUSCULAR | Status: DC
  Administered 2018-03-02: 150 mg via INTRAMUSCULAR

## 2018-03-02 NOTE — Progress Notes (Signed)
Agree with A & P. 

## 2018-03-02 NOTE — Progress Notes (Signed)
Pt is in the office for initial depo injection.  Administered in [facility] and pt tolerated well. Next depo due. 4/30-5/14 .Marland Kitchen Administrations This Visit    medroxyPROGESTERone (DEPO-PROVERA) injection 150 mg    Admin Date 03/02/2018 Action Given Dose 150 mg Route Intramuscular Administered By Katrina Stack, RN

## 2018-05-05 ENCOUNTER — Other Ambulatory Visit: Payer: Self-pay

## 2018-05-05 ENCOUNTER — Encounter (HOSPITAL_COMMUNITY): Payer: Self-pay

## 2018-05-05 ENCOUNTER — Emergency Department (HOSPITAL_COMMUNITY)
Admission: EM | Admit: 2018-05-05 | Discharge: 2018-05-05 | Disposition: A | Discharge status: Home / Self Care | Payer: BLUE CROSS/BLUE SHIELD | Attending: Emergency Medicine | Admitting: Emergency Medicine

## 2018-05-05 ENCOUNTER — Emergency Department (HOSPITAL_COMMUNITY): Payer: BLUE CROSS/BLUE SHIELD

## 2018-05-05 DIAGNOSIS — Z79899 Other long term (current) drug therapy: Secondary | ICD-10-CM | POA: Diagnosis not present

## 2018-05-05 DIAGNOSIS — Z87891 Personal history of nicotine dependence: Secondary | ICD-10-CM | POA: Diagnosis not present

## 2018-05-05 DIAGNOSIS — J029 Acute pharyngitis, unspecified: Secondary | ICD-10-CM

## 2018-05-05 DIAGNOSIS — R6883 Chills (without fever): Secondary | ICD-10-CM | POA: Insufficient documentation

## 2018-05-05 DIAGNOSIS — R590 Localized enlarged lymph nodes: Secondary | ICD-10-CM | POA: Diagnosis not present

## 2018-05-05 LAB — GROUP A STREP BY PCR: Group A Strep by PCR: NOT DETECTED

## 2018-05-05 LAB — BASIC METABOLIC PANEL
Anion gap: 8 (ref 5–15)
BUN: 11 mg/dL (ref 6–20)
CO2: 21 mmol/L — ABNORMAL LOW (ref 22–32)
Calcium: 8.8 mg/dL — ABNORMAL LOW (ref 8.9–10.3)
Chloride: 107 mmol/L (ref 98–111)
Creatinine, Ser: 0.72 mg/dL (ref 0.44–1.00)
GFR calc Af Amer: >60 mL/min (ref >60)
GFR calc non Af Amer: >60 mL/min (ref >60)
Glucose, Bld: 92 mg/dL (ref 70–99)
Potassium: 3.7 mmol/L (ref 3.5–5.1)
Sodium: 136 mmol/L (ref 135–145)

## 2018-05-05 LAB — I-STAT BETA HCG BLOOD, ED (MC, WL, AP ONLY): I-stat hCG, quantitative: <5 m[IU]/mL (ref <5)

## 2018-05-05 MED ORDER — ACETAMINOPHEN 325 MG PO TABS
650.0000 mg | ORAL_TABLET | Freq: Once | ORAL | Status: AC
  Administered 2018-05-05: 650 mg via ORAL
  Filled 2018-05-05: qty 2

## 2018-05-05 MED ORDER — SODIUM CHLORIDE (PF) 0.9 % IJ SOLN
INTRAMUSCULAR | Status: AC
  Filled 2018-05-05: qty 50

## 2018-05-05 MED ORDER — KETOROLAC TROMETHAMINE 15 MG/ML IJ SOLN
15.0000 mg | Freq: Once | INTRAMUSCULAR | Status: AC
  Administered 2018-05-05: 15 mg via INTRAVENOUS
  Filled 2018-05-05: qty 1

## 2018-05-05 MED ORDER — NAPROXEN 500 MG PO TABS: 500.0000 mg | ORAL_TABLET | Freq: Two times a day (BID) | ORAL | 0 refills | Status: DC

## 2018-05-05 MED ORDER — IOHEXOL 300 MG/ML  SOLN
100.0000 mL | Freq: Once | INTRAMUSCULAR | Status: AC | PRN
  Administered 2018-05-05: 75 mL via INTRAVENOUS

## 2018-05-05 NOTE — Discharge Instructions (Signed)
Evaluated today for sore throat and neck swelling. CT scan showed an enlarged lymph node with recommendation to follow up with ENT in 1-2 months. I have referred you to one on your discharge paperwork. You will need to call them in the future to schedule this appointment. You may also call to schedule if you continue to have a sore throat beyond 1 weeks. Would recommend warm liquids and tylenol for your pain. You may also take Zyrtec in case of an allergic component to your sore throat.  Return to the ED for any new or worsening symptoms.

## 2018-05-05 NOTE — ED Triage Notes (Addendum)
Pt reports sore throat since Tuesday. Pt reports chills and body aches. Pt denies fevers/Cough. Pt reports loss of appetite. Pt seen at [facility] and was advised to gurgle with salt water. Pt reports taking OTC  Motrin with minimal relief.

## 2018-05-05 NOTE — ED Provider Notes (Signed)
[facility]-EMERGENCY DEPT Provider Note   CSN: [id number] Arrival date & time: 05/05/18  0855  History   Chief Complaint Chief Complaint  Patient presents with   Sore Throat    HPI Cheyenne Phillips is a 28 y.o. female with past medical history significant for sickle cell trait who presents for evaluation of sore throat and left-sided neck pain.  She was seen by urgent care and had negative strep test.  Patient states she has had continued sore throat since onset of symptoms on Tuesday, 2 days PTA.  Patient states she is also had subjective chills.  Rates her pain a 4/10.  Pain does not radiate.  Has been taking 400 mg of ibuprofen every 8 hours for symptoms.  States the ibuprofen "helps" pain, however after about 6 hours is resolved.  Patient states she is also been putting a warm compress to the left side of her neck as well as gargling with warm salt water.  Patient states she has had decreased solid food intake since onset of symptoms.  She denies fever, nausea, vomiting, neck stiffness, neck rigidity, nasal congestion, rhinorrhea, drooling, dysphasia, trismus, voice changes, chest pain, shortness of breath, cough, abdominal pain.  Denies recent injuries or trauma.  Denies history of peritonsillar abscess or retropharyngeal abscess.  She is not followed by ENT.  Denies known travel or recent sick contacts.  Denies any additional aggravating or alleviating factors.  History obtained from patient.  No interpreter was used.     HPI  Past Medical History:  Diagnosis Date   Medical history non-contributory    Seasonal allergies    Sickle cell trait [facility])     Patient Active Problem List   Diagnosis Date Noted   Vaginal discharge 02/16/2018   Depo-Provera contraceptive status 10/12/2017    Past Surgical History:  Procedure Laterality Date   CESAREAN SECTION N/A 05/31/2013   Procedure: CESAREAN SECTION;  Surgeon: Brock Bad, MD;  Location: [facility] ORS;   Service: Obstetrics;  Laterality: N/A;   WISDOM TOOTH EXTRACTION       OB History    Gravida  4   Para  2   Term  2   Preterm      AB  2   Living  2     SAB      TAB  2   Ectopic      Multiple  0   Live Births  2            Home Medications    Prior to Admission medications   Medication Sig Start Date End Date Taking? Authorizing Provider  ibuprofen (ADVIL,MOTRIN) 200 MG tablet Take 400 mg by mouth every 6 (six) hours as needed for moderate pain.   Yes [provider]  medroxyPROGESTERone (DEPO-PROVERA) 150 MG/ML injection Inject 1 mL (150 mg total) into the muscle every 3 (three) months. 02/16/18  Yes Hermina Staggers, MD  naproxen (NAPROSYN) 500 MG tablet Take 1 tablet (500 mg total) by mouth 2 (two) times daily. 05/05/18   Eiliyah Reh A, PA-C    Family History Family History  Problem Relation Age of Onset   Hypertension Maternal Grandmother    Hyperlipidemia Maternal Grandmother    Arthritis Maternal Grandmother    Asthma Maternal Grandmother     Social History Social History   Tobacco Use   Smoking status: Former Smoker    Packs/day: 1.00   Smokeless tobacco: Never Used  Substance Use Topics  Alcohol use: Yes    Alcohol/week: 0.0 standard drinks    Comment: weekly    Drug use: No     Allergies   Patient has no known allergies.  Review of Systems Review of Systems  Constitutional: Positive for appetite change and chills. Negative for diaphoresis, fatigue and fever.  HENT: Positive for sore throat. Negative for congestion, drooling, ear discharge, ear pain, facial swelling, hearing loss, mouth sores, nosebleeds, postnasal drip, rhinorrhea, sinus pressure, sinus pain, sneezing, tinnitus, trouble swallowing and voice change.   Eyes: Negative.   Respiratory: Negative.   Cardiovascular: Negative.   Gastrointestinal: Negative.   Genitourinary: Negative.   Musculoskeletal: Negative.  Negative for arthralgias, back pain,  gait problem, joint swelling and neck stiffness.       Left anterior neck pain.   Skin: Negative.   Neurological: Negative.   All other systems reviewed and are negative.  Physical Exam Updated Vital Signs BP 114/76    Pulse 78    Temp 98.4 F (36.9 C) (Oral)    Resp 18    Ht 5\' 6"  (1.676 m)    Wt 72.6 kg    SpO2 100%    BMI 25.82 kg/m   Physical Exam Vitals signs and nursing note reviewed.  Constitutional:      General: She is not in acute distress.    Appearance: She is not ill-appearing, toxic-appearing or diaphoretic.  HENT:     Head: Normocephalic and atraumatic.     Jaw: There is normal jaw occlusion.     Right Ear: Tympanic membrane, ear canal and external ear normal. There is no impacted cerumen. No hemotympanum. Tympanic membrane is not injected, scarred, perforated, erythematous, retracted or bulging.     Left Ear: Tympanic membrane, ear canal and external ear normal. There is no impacted cerumen. No hemotympanum. Tympanic membrane is not injected, scarred, perforated, erythematous, retracted or bulging.     Ears:     Comments: No Mastoid tenderness.    Nose: Nose normal.     Comments: No rhinorrhea and congestion to bilateral nares.  No sinus tenderness.    Mouth/Throat:     Lips: Pink.     Comments: Posterior oropharynx with mild erythema without exudate.  Mucous membranes moist.  Tonsils without erythema or exudate.  Uvula midline without deviation.  No evidence of PTA or RPA.  No drooling, dysphasia or trismus.  Phonation normal. Sublingual area soft. No submandibular swelling. Eyes:     Extraocular Movements: Extraocular movements intact.     Right eye: Normal extraocular motion and no nystagmus.     Left eye: Normal extraocular motion and no nystagmus.     Conjunctiva/sclera: Conjunctivae normal.  Neck:     Trachea: Trachea and phonation normal.     Meningeal: Brudzinski's sign and Kernig's sign absent.      Comments: No Neck stiffness or neck rigidity.  No  meningismus.  Mild non tender cervical lymphadenopathy.  Tenderness palpation to left anterior neck.  No evidence of overlying skin changes.  No induration or fluctuance.  No erythema or warmth. No carotid bruit or tenderness over carotid. Cardiovascular:     Pulses: Normal pulses.     Heart sounds: Normal heart sounds.     Comments: No murmurs rubs or gallops. Pulmonary:     Comments: Clear to auscultation bilaterally without wheeze, rhonchi or rales.  No accessory muscle usage.  Able speak in full sentences. Abdominal:     Comments: Soft, nontender without rebound or guarding.  No CVA tenderness.  Musculoskeletal:     Comments: Moves all 4 extremities without difficulty.  Lower extremities without edema, erythema or warmth.  Lymphadenopathy:     Cervical: Cervical adenopathy present.  Skin:    Comments: Brisk capillary refill.  No rashes or lesions.  Neurological:     General: No focal deficit present.     Mental Status: She is alert.     Cranial Nerves: Cranial nerves are intact.     Gait: Gait is intact.     Comments: Ambulatory in department without difficulty.  Cranial nerves II through XII grossly intact.  No facial droop.  No aphasia.    ED Treatments / Results  Labs (all labs ordered are listed, but only abnormal results are displayed) Labs Reviewed  BASIC METABOLIC PANEL - Abnormal; Notable for the following components:      Result Value   CO2 21 (*)    Calcium 8.8 (*)    All other components within normal limits  GROUP A STREP BY PCR  I-STAT BETA HCG BLOOD, ED ([facility], [facility], [facility] ONLY)    EKG None  Radiology Ct Soft Tissue Neck W Contrast  Result Date: 05/05/2018 CLINICAL DATA:  Sore throat, stridor.  Chills and body aches, fever EXAM: CT NECK WITH CONTRAST TECHNIQUE: Multidetector CT imaging of the neck was performed using the standard protocol following the bolus administration of intravenous contrast. CONTRAST:  75mL OMNIPAQUE IOHEXOL 300 MG/ML  SOLN COMPARISON:  CT  neck 06/16/2011 FINDINGS: Pharynx and larynx: Normal. No mass or swelling. Salivary glands: No inflammation, mass, or stone. Thyroid: Negative Lymph nodes: Left level 2 lymph node 15 mm, slightly increased from the prior study however this node was prominent on the prior study measuring 12 mm. Additional 8 mm left level 2 lymph node. Left occipital posterior lymph nodes have improved in the interval now measuring 8.6 mm and 7.6 mm. Right level 2 node 7.2 mm. Vascular: Normal vascular enhancement Limited intracranial: Negative Visualized orbits: Negative Mastoids and visualized paranasal sinuses: Mild mucosal edema paranasal sinuses Skeleton: No acute skeletal abnormality. Caries right upper first molar. Upper chest: Lung apices clear bilaterally Other: None IMPRESSION: Negative for epiglottitis or tonsillitis.  Normal airway. Mildly enlarged lymph nodes in the left neck. Patient prominent left upper lymph nodes in 2013. Left level 2 lymph node is 15.2 mm today and is there is clinical follow-up to check for interval growth. Other left-sided lymph nodes are slightly smaller compared with 2013. Electronically Signed   By: Marlan Palau M.D.   On: 05/05/2018 11:37    Procedures Procedures (including critical care time)  Medications Ordered in ED Medications  sodium chloride (PF) 0.9 % injection (has no administration in time range)  acetaminophen (TYLENOL) tablet 650 mg (650 mg Oral Given 05/05/18 0949)  ketorolac (TORADOL) 15 MG/ML injection 15 mg (15 mg Intravenous Given 05/05/18 1017)  iohexol (OMNIPAQUE) 300 MG/ML solution 100 mL (75 mLs Intravenous Contrast Given 05/05/18 1112)     Initial Impression / Assessment and Plan / ED Course  I have reviewed the triage vital signs and the nursing notes.  Pertinent labs & imaging results that were available during my care of the patient were reviewed by me and considered in my medical decision making (see chart for details).  28 year old who presents for  evaluation of sore throat and left neck pain. Afebrile, non septic, non ill appearing. Sore throat x 2 days seen at [facility] with negative strep. Pain with improvement with  Ibuprofen at home however with recurrent pain after 4-6 hours. No injuries or trauma to neck. No cervical TTP. Posterior OP with mild erythema without exudate. No drooling, dysphagia or trismus. Normal phonation. Tonsils without edema, erythema or exudate. Uvula midline without deviation. No evidence of PTA or RPA. Gingiva without erythema. Sublingual area soft. No submandibular swelling. No evidence of Ludwig's angina or deep space infection. Neck with non tender cervical lymphadenopathy L>R. No neck stiffness or neck rigidity. No meningismus. Low suspicion for meningitis. No overlying skin changes to neck, specifically no induration, fluctuance, warmth, redness or swelling. No JVD or carotid bruit. No facial swelling. No tenderness over carotid and without HA or neurologic symptoms. Low suspicion for dissection as cause of her neck pain. Lungs clear. Heart without murmur rubs or gallops. Strep negative, hcg negative, metabolic panel without electrolyte, renal or liver abnormalities. CT scan enlarged left neck lymph nodes. Has hx of prominent nodes in left neck however with increased since last imaging in 2013. Recommends clinical follow up for interval growth. Patient with improvement in pain with Toradol. Discussed symptomatic management and follow up with PCP/ENT for reevalaution. Patient able to tolerate PO intake without difficulty. Patient hemodynamically stable and appropriate for dc home at this time. Discussed strict return precautions. Patient voiced understanding and is agreeable for follow-up.     Chester P Bley was evaluated in Emergency Department on 05/05/2018 for the symptoms described in the history of present illness. She was evaluated in the context of the global COVID-19 pandemic, which necessitated consideration that the  patient might be at risk for infection with the SARS-CoV-2 virus that causes COVID-19. Institutional protocols and algorithms that pertain to the evaluation of patients at risk for COVID-19 are in a state of rapid change based on information released by regulatory bodies including the CDC and federal and state organizations. These policies and algorithms were followed during the patient's care in the ED. Final Clinical Impressions(s) / ED Diagnoses   Final diagnoses:  Sore throat  Enlarged lymph node in neck    ED Discharge Orders         Ordered    naproxen (NAPROSYN) 500 MG tablet  2 times daily     05/05/18 1145           Aryanna Shaver A, PA-C 05/05/18 1207    Benjiman Core, MD 05/05/18 1409

## 2018-05-05 NOTE — ED Notes (Signed)
Patient transported to CT 

## 2018-05-10 ENCOUNTER — Other Ambulatory Visit: Payer: Self-pay | Admitting: Obstetrics and Gynecology

## 2018-05-20 ENCOUNTER — Ambulatory Visit: Payer: BLUE CROSS/BLUE SHIELD

## 2018-05-27 ENCOUNTER — Telehealth: Payer: Self-pay

## 2018-07-08 ENCOUNTER — Telehealth: Payer: Self-pay

## 2018-07-08 NOTE — Telephone Encounter (Signed)
Patient called and left message requesting rx for flagyl. Called patient back to get, no answer, line busy x2. Refill request was received from the pharmacy. This has been faxed back with approval.

## 2018-07-20 ENCOUNTER — Other Ambulatory Visit: Payer: Self-pay | Admitting: Pediatric Intensive Care

## 2018-07-20 DIAGNOSIS — Z20822 Contact with and (suspected) exposure to covid-19: Secondary | ICD-10-CM

## 2018-07-26 LAB — NOVEL CORONAVIRUS, NAA: SARS-CoV-2, NAA: NOT DETECTED

## 2018-07-26 LAB — SPECIMEN STATUS REPORT

## 2018-10-17 ENCOUNTER — Ambulatory Visit: Payer: BC Managed Care – PPO | Admitting: Advanced Practice Midwife

## 2018-11-08 ENCOUNTER — Telehealth: Payer: Self-pay

## 2018-11-08 NOTE — Telephone Encounter (Signed)
TC from pt requesting to restart Depo.  She requests Depo to be sent to the pharmacy so she can bring it with her to the appt tomorrow.  Last unprotected IC was this past Friday per pt  Pt understands per protocol, she will need to wait 14 days without unprotected IC prior to restarting Depo. Pt voiced frustration. Pt to discuss with provider tomorrow.

## 2018-11-09 ENCOUNTER — Other Ambulatory Visit: Payer: Self-pay

## 2018-11-09 ENCOUNTER — Ambulatory Visit (INDEPENDENT_AMBULATORY_CARE_PROVIDER_SITE_OTHER): Payer: BC Managed Care – PPO | Admitting: Nurse Practitioner

## 2018-11-09 ENCOUNTER — Encounter: Payer: Self-pay | Admitting: Nurse Practitioner

## 2018-11-09 VITALS — BP 108/72 | HR 74 | Ht 66.0 in | Wt 159.0 lb

## 2018-11-09 DIAGNOSIS — Z113 Encounter for screening for infections with a predominantly sexual mode of transmission: Secondary | ICD-10-CM | POA: Diagnosis not present

## 2018-11-09 DIAGNOSIS — Z3202 Encounter for pregnancy test, result negative: Secondary | ICD-10-CM

## 2018-11-09 DIAGNOSIS — Z98891 History of uterine scar from previous surgery: Secondary | ICD-10-CM

## 2018-11-09 DIAGNOSIS — Z23 Encounter for immunization: Secondary | ICD-10-CM

## 2018-11-09 DIAGNOSIS — Z3042 Encounter for surveillance of injectable contraceptive: Secondary | ICD-10-CM

## 2018-11-09 DIAGNOSIS — Z01419 Encounter for gynecological examination (general) (routine) without abnormal findings: Secondary | ICD-10-CM | POA: Diagnosis not present

## 2018-11-09 DIAGNOSIS — Z30013 Encounter for initial prescription of injectable contraceptive: Secondary | ICD-10-CM

## 2018-11-09 DIAGNOSIS — N898 Other specified noninflammatory disorders of vagina: Secondary | ICD-10-CM

## 2018-11-09 LAB — POCT URINE PREGNANCY: Preg Test, Ur: NEGATIVE

## 2018-11-09 MED ORDER — MEDROXYPROGESTERONE ACETATE 150 MG/ML IM SUSP: 150.0000 mg | INTRAMUSCULAR | 3 refills | Status: DC

## 2018-11-09 MED ORDER — MEDROXYPROGESTERONE ACETATE 150 MG/ML IM SUSP
150.0000 mg | Freq: Once | INTRAMUSCULAR | Status: AC
  Administered 2018-11-09: 150 mg via INTRAMUSCULAR

## 2018-11-09 NOTE — Progress Notes (Signed)
GYNECOLOGY ANNUAL PREVENTATIVE CARE ENCOUNTER NOTE  Subjective:   Cheyenne Phillips is a 28 y.o. [phone] female here for a routine annual gynecologic exam.  Current complaints: wants to restart Depo - last was several months ago.  Had intercourse on Saturday and did not use condoms. Satates she has vaginal discharge. LMP 10-22-18.   Denies abnormal vaginal bleeding, discharge, pelvic pain, problems with intercourse or other gynecologic concerns.    Gynecologic History LMP 10-22-18 Contraception: overdue but on Depo Last Pap: 10-12-17. Results were: normal   Obstetric History OB History  Gravida Para Term Preterm AB Living  4 2 2   2 2   SAB TAB Ectopic Multiple Live Births    2   0 2    # Outcome Date GA Lbr Len/2nd Weight Sex Delivery Anes PTL Lv  4 TAB 12/12/16          3 TAB 04/11/16          2 Term 11/20/14 [redacted]w[redacted]d 10:08 / 00:19 7 lb 1 oz (3.204 kg) F VBAC EPI  LIV  1 Term 05/31/13 [redacted]w[redacted]d  6 lb 12.8 oz (3.084 kg) F CS-LTranv EPI  LIV    Past Medical History:  Diagnosis Date  . Medical history non-contributory   . Seasonal allergies   . Sickle cell trait [facility])     Past Surgical History:  Procedure Laterality Date  . CESAREAN SECTION N/A 05/31/2013   Procedure: CESAREAN SECTION;  Surgeon: 06/02/2013, MD;  Location: [facility];  Service: Obstetrics;  Laterality: N/A;  . WISDOM TOOTH EXTRACTION      Current Outpatient Medications on File Prior to Visit  Medication Sig Dispense Refill  . ibuprofen (ADVIL,MOTRIN) 200 MG tablet Take 400 mg by mouth every 6 (six) hours as needed for moderate pain.     Current Facility-Administered Medications on File Prior to Visit  Medication Dose Route Frequency Provider Last Rate Last Dose  . medroxyPROGESTERone (DEPO-PROVERA) injection 150 mg  150 mg Intramuscular Q90 days Brock Bad, MD   150 mg at 03/02/18 0850    No Known Allergies  Social History   Socioeconomic History  . Marital status: Single    Spouse name: Not on  file  . Number of children: Not on file  . Years of education: Not on file  . Highest education level: Not on file  Occupational History  . Not on file  Social Needs  . Financial resource strain: Not on file  . Food insecurity    Worry: Not on file    Inability: Not on file  . Transportation needs    Medical: Not on file    Non-medical: Not on file  Tobacco Use  . Smoking status: Former Smoker    Packs/day: 1.00  . Smokeless tobacco: Never Used  Substance and Sexual Activity  . Alcohol use: Yes    Alcohol/week: 0.0 standard drinks    Comment: weekly   . Drug use: No  . Sexual activity: Yes    Partners: Male    Birth control/protection: None  Lifestyle  . Physical activity    Days per week: Not on file    Minutes per session: Not on file  . Stress: Not on file  Relationships  . Social 05/01/18 on phone: Not on file    Gets together: Not on file    Attends religious service: Not on file    Active member of club or organization: Not on  file    Attends meetings of clubs or organizations: Not on file    Relationship status: Not on file  . Intimate partner violence    Fear of current or ex partner: Not on file    Emotionally abused: Not on file    Physically abused: Not on file    Forced sexual activity: Not on file  Other Topics Concern  . Not on file  Social History Narrative  . Not on file    Family History  Problem Relation Age of Onset  . Hypertension Maternal Grandmother   . Hyperlipidemia Maternal Grandmother   . Arthritis Maternal Grandmother   . Asthma Maternal Grandmother     The following portions of the patient's history were reviewed and updated as appropriate: allergies, current medications, past family history, past medical history, past social history, past surgical history and problem list.  Review of Systems Pertinent items noted in HPI and remainder of comprehensive ROS otherwise negative.   Objective:  BP 108/72   Pulse 74    Ht 5\' 6"  (1.676 m)   Wt 159 lb (72.1 kg)   BMI 25.66 kg/m  CONSTITUTIONAL: Well-developed, well-nourished female in no acute distress.  HENT:  Normocephalic, atraumatic, External right and left ear normal.  EYES: Conjunctivae and EOM are normal. Pupils are equal, round.  No scleral icterus.  NECK: Normal range of motion, supple, no masses.  Normal thyroid. SKIN: Skin is warm and dry. No rash noted. Not diaphoretic. No erythema. No pallor. Breasts - no masses, no tenderness, normal exam. Cardiac -- no edema no murmur, heart sounds normal Lungs - no wheezing to ausculation no distress with breathing NEUROLOGIC: Alert and oriented to person, place, and time. Normal reflexes, muscle tone coordination. No cranial nerve deficit noted. PSYCHIATRIC: Normal mood and affect. Normal behavior. Normal judgment and thought content. ABDOMEN: Soft, no distention noted.  No tenderness, rebound or guarding.  PELVIC: Normal appearing external genitalia; normal appearing vaginal mucosa and cervix.  No abnormal discharge noted.  Pap smear not obtained.  Normal uterine size, no other palpable masses, no uterine or adnexal tenderness. MUSCULOSKELETAL: Normal range of motion. No tenderness.  No cyanosis, clubbing, or edema.    Assessment and Plan:  1. Vaginal discharge Declined blood draw today for full STD workup  - Cervicovaginal ancillary only( [place])  2. Initiation of Depo Provera Discussed that she will need a pregnancy test in one month as she may be pregnant but the test will not show a pregnancy today.  Discussed that depo will not harm a pregnancy if she might be pregnant before lab results show it today and client elects to get Depo today.  Strongly encouraged no sex or condoms with sex for the next 2 weeks until the Depo is active in her body.  Client in agreement with this plan.  3. Women's annual routine gynecological examination   Will follow up results of pap smear and manage accordingly.  Routine preventative health maintenance measures emphasized. Please refer to After Visit Summary for other counseling recommendations.    Earlie Server, RN, MSN, NP-BC Nurse Practitioner, [place] for [facility]

## 2018-11-09 NOTE — Progress Notes (Signed)
Patient presents for her Annual Exam today.   LMP:10/22/2018 monthly lastly 7 days , heavy flow.  Last pap:10/12/2017 WNL  Contraception: None. Wants Depo  Pt states she has had good results w/ depo in the past. Pt notes unprotected intercourse this past weekend.  STD Screening: Desires Full Panel  No Family hx of Breast Cancer.   CC: Possible yeast or BV  Infection. Pt notes vaginal irritation since yesterday.   UPT NEG Per provider start depo.

## 2018-11-10 ENCOUNTER — Other Ambulatory Visit: Payer: Self-pay

## 2018-11-10 DIAGNOSIS — Z20822 Contact with and (suspected) exposure to covid-19: Secondary | ICD-10-CM

## 2018-11-10 DIAGNOSIS — N898 Other specified noninflammatory disorders of vagina: Secondary | ICD-10-CM

## 2018-11-10 MED ORDER — FLUCONAZOLE 150 MG PO TABS: 150.0000 mg | ORAL_TABLET | Freq: Once | ORAL | 3 refills | Status: AC

## 2018-11-10 NOTE — Progress Notes (Signed)
Rx diflucan per protocol for yeast symptoms.

## 2018-11-12 LAB — NOVEL CORONAVIRUS, NAA: SARS-CoV-2, NAA: NOT DETECTED

## 2018-11-14 ENCOUNTER — Telehealth: Payer: Self-pay

## 2018-11-14 MED ORDER — METRONIDAZOLE 500 MG PO TABS: 500.0000 mg | ORAL_TABLET | Freq: Two times a day (BID) | ORAL | 0 refills | Status: DC

## 2018-11-14 NOTE — Telephone Encounter (Signed)
Returned call and pt was angry that results are not back yet because she has vaginal discharge, odor and irritation. Pt has hx of bv and is requesting flagyl, sent rx per protocol.

## 2018-11-16 LAB — CERVICOVAGINAL ANCILLARY ONLY
Bacterial Vaginitis (gardnerella): POSITIVE — AB
Candida Glabrata: NEGATIVE
Candida Vaginitis: POSITIVE — AB
Comment: NEGATIVE
Comment: NEGATIVE
Comment: NEGATIVE
Comment: NEGATIVE
Trichomonas: NEGATIVE

## 2018-11-16 MED ORDER — FLUCONAZOLE 150 MG PO TABS: ORAL_TABLET | ORAL | 0 refills | Status: DC

## 2018-11-16 MED ORDER — TERCONAZOLE 0.4 % VA CREA: 1.0000 | TOPICAL_CREAM | Freq: Every day | VAGINAL | 1 refills | Status: AC

## 2018-11-16 NOTE — Addendum Note (Signed)
Addended by: Virginia Rochester on: 11/16/2018 04:28 PM   Modules accepted: Orders

## 2018-12-02 ENCOUNTER — Other Ambulatory Visit: Payer: Self-pay

## 2018-12-02 DIAGNOSIS — N898 Other specified noninflammatory disorders of vagina: Secondary | ICD-10-CM

## 2018-12-02 MED ORDER — METRONIDAZOLE 500 MG PO TABS: 500.0000 mg | ORAL_TABLET | Freq: Two times a day (BID) | ORAL | 0 refills | Status: DC

## 2018-12-02 NOTE — Progress Notes (Signed)
Rx resent   Pt lost recent prescription Pt made aware. Marland Kitchen

## 2019-01-24 ENCOUNTER — Other Ambulatory Visit: Payer: BC Managed Care – PPO

## 2019-01-30 ENCOUNTER — Other Ambulatory Visit: Payer: Self-pay

## 2019-01-30 DIAGNOSIS — N898 Other specified noninflammatory disorders of vagina: Secondary | ICD-10-CM

## 2019-01-30 MED ORDER — METRONIDAZOLE 500 MG PO TABS: 500.0000 mg | ORAL_TABLET | Freq: Two times a day (BID) | ORAL | 0 refills | Status: DC

## 2019-01-30 NOTE — Progress Notes (Signed)
Pt called and reports symptoms of vaginal odor and irritation for the last couple of days. Rx flagyl sent per protocol for BV. Pt made aware of Rx sent to pharmacy and instructed to call back if symptoms aren't alleviated. Pt voices understanding.

## 2019-02-28 ENCOUNTER — Ambulatory Visit: Payer: BC Managed Care – PPO | Admitting: Obstetrics

## 2019-02-28 ENCOUNTER — Encounter: Payer: Self-pay | Admitting: Obstetrics

## 2019-02-28 ENCOUNTER — Other Ambulatory Visit: Payer: Self-pay

## 2019-02-28 VITALS — Wt 160.7 lb

## 2019-02-28 DIAGNOSIS — N898 Other specified noninflammatory disorders of vagina: Secondary | ICD-10-CM | POA: Diagnosis not present

## 2019-02-28 DIAGNOSIS — N760 Acute vaginitis: Secondary | ICD-10-CM | POA: Diagnosis not present

## 2019-02-28 DIAGNOSIS — Z3042 Encounter for surveillance of injectable contraceptive: Secondary | ICD-10-CM | POA: Diagnosis not present

## 2019-02-28 DIAGNOSIS — Z113 Encounter for screening for infections with a predominantly sexual mode of transmission: Secondary | ICD-10-CM | POA: Diagnosis not present

## 2019-02-28 DIAGNOSIS — Z3009 Encounter for other general counseling and advice on contraception: Secondary | ICD-10-CM

## 2019-02-28 DIAGNOSIS — Z30011 Encounter for initial prescription of contraceptive pills: Secondary | ICD-10-CM

## 2019-02-28 DIAGNOSIS — B9689 Other specified bacterial agents as the cause of diseases classified elsewhere: Secondary | ICD-10-CM

## 2019-02-28 MED ORDER — TINIDAZOLE 500 MG PO TABS: 1000.0000 mg | ORAL_TABLET | Freq: Every day | ORAL | 4 refills | Status: DC

## 2019-02-28 MED ORDER — MEDROXYPROGESTERONE ACETATE 150 MG/ML IM SUSP: 150.0000 mg | INTRAMUSCULAR | 3 refills | Status: DC

## 2019-02-28 MED ORDER — LO LOESTRIN FE 1 MG-10 MCG / 10 MCG PO TABS: 1.0000 | ORAL_TABLET | Freq: Every day | ORAL | 4 refills | Status: DC

## 2019-02-28 MED ORDER — FLUCONAZOLE 150 MG PO TABS: ORAL_TABLET | ORAL | 4 refills | Status: DC

## 2019-02-28 NOTE — Progress Notes (Signed)
Patient ID: Cheyenne Phillips, female   DOB: 11/22/1990, 29 y.o.   MRN: [id number]  Chief Complaint  Patient presents with  . Vaginal Discharge    HPI Cheyenne Phillips is a 29 y.o. female.  Malodorous vaginal discharge. HPI  Past Medical History:  Diagnosis Date  . Medical history non-contributory   . Seasonal allergies   . Sickle cell trait [facility])     Past Surgical History:  Procedure Laterality Date  . CESAREAN SECTION N/A 05/31/2013   Procedure: CESAREAN SECTION;  Surgeon: Shelly Bombard, MD;  Location: [facility];  Service: Obstetrics;  Laterality: N/A;  . WISDOM TOOTH EXTRACTION      Family History  Problem Relation Age of Onset  . Hypertension Maternal Grandmother   . Hyperlipidemia Maternal Grandmother   . Arthritis Maternal Grandmother   . Asthma Maternal Grandmother     Social History Social History   Tobacco Use  . Smoking status: Former Smoker    Packs/day: 1.00  . Smokeless tobacco: Never Used  Substance Use Topics  . Alcohol use: Yes    Alcohol/week: 0.0 standard drinks    Comment: weekly   . Drug use: No    No Known Allergies  Current Outpatient Medications  Medication Sig Dispense Refill  . fluconazole (DIFLUCAN) 150 MG tablet Take one by mouth on Day 1 and Day 4 2 tablet 4  . ibuprofen (ADVIL,MOTRIN) 200 MG tablet Take 400 mg by mouth every 6 (six) hours as needed for moderate pain.    . LO LOESTRIN FE 1 MG-10 MCG / 10 MCG tablet Take 1 tablet by mouth daily. Start taking on 1st day of period. 3 Package 4  . medroxyPROGESTERone (DEPO-PROVERA) 150 MG/ML injection Inject 1 mL (150 mg total) into the muscle every 3 (three) months. 1 mL 3  . metroNIDAZOLE (FLAGYL) 500 MG tablet Take 1 tablet (500 mg total) by mouth 2 (two) times daily. (Patient not taking: Reported on 02/28/2019) 14 tablet 0  . tinidazole (TINDAMAX) 500 MG tablet Take 2 tablets (1,000 mg total) by mouth daily with breakfast. 10 tablet 4   Current Facility-Administered Medications   Medication Dose Route Frequency Provider Last Rate Last Admin  . medroxyPROGESTERone (DEPO-PROVERA) injection 150 mg  150 mg Intramuscular Q90 days Chancy Milroy, MD   150 mg at 03/02/18 9562    Review of Systems Review of Systems Constitutional: negative for fatigue and weight loss Respiratory: negative for cough and wheezing Cardiovascular: negative for chest pain, fatigue and palpitations Gastrointestinal: negative for abdominal pain and change in bowel habits Genitourinary:positive for malodorous vaginal discharge Integument/breast: negative for nipple discharge Musculoskeletal:negative for myalgias Neurological: negative for gait problems and tremors Behavioral/Psych: negative for abusive relationship, depression Endocrine: negative for temperature intolerance      Weight 160 lb 11.2 oz (72.9 kg).  Physical Exam Physical Exam           General:  Alert and no distress Abdomen:  normal findings: no organomegaly, soft, non-tender and no hernia  Pelvis:  External genitalia: normal general appearance Urinary system: urethral meatus normal and bladder without fullness, nontender Vaginal: normal without tenderness, induration or masses Cervix: normal appearance Adnexa: normal bimanual exam Uterus: anteverted and non-tender, normal size    50% of 15 min visit spent on counseling and coordination of care.   Data Reviewed Labs  Assessment     1. Vaginal discharge Rx: - Cervicovaginal ancillary only( [place]) - fluconazole (DIFLUCAN) 150 MG tablet; Take one by mouth  on Day 1 and Day 4  Dispense: 2 tablet; Refill: 4  2. BV (bacterial vaginosis) Rx: - tinidazole (TINDAMAX) 500 MG tablet; Take 2 tablets (1,000 mg total) by mouth daily with breakfast.  Dispense: 10 tablet; Refill: 4  3. Encounter for surveillance of injectable contraceptive Rx: - medroxyPROGESTERone (DEPO-PROVERA) 150 MG/ML injection; Inject 1 mL (150 mg total) into the muscle every 3 (three) months.   Dispense: 1 mL; Refill: 3  4. Encounter for other general counseling or advice on contraception - does not want to continue Depo - wants OCP's  5. Encounter for initial prescription of contraceptive pills Rx: - LO LOESTRIN FE 1 MG-10 MCG / 10 MCG tablet; Take 1 tablet by mouth daily. Start taking on 1st day of period.  Dispense: 3 Package; Refill: 4    Plan    No orders of the defined types were placed in this encounter.  Meds ordered this encounter  Medications  . tinidazole (TINDAMAX) 500 MG tablet    Sig: Take 2 tablets (1,000 mg total) by mouth daily with breakfast.    Dispense:  10 tablet    Refill:  4  . fluconazole (DIFLUCAN) 150 MG tablet    Sig: Take one by mouth on Day 1 and Day 4    Dispense:  2 tablet    Refill:  4  . medroxyPROGESTERone (DEPO-PROVERA) 150 MG/ML injection    Sig: Inject 1 mL (150 mg total) into the muscle every 3 (three) months.    Dispense:  1 mL    Refill:  3  . LO LOESTRIN FE 1 MG-10 MCG / 10 MCG tablet    Sig: Take 1 tablet by mouth daily. Start taking on 1st day of period.    Dispense:  3 Package    Refill:  4    Submit other coverage code 3  BIN:  [id number]  PCN:  [id number]   GRP:  [id number]   ID:  [id number]     Brock Bad, MD 02/28/2019 10:35 AM

## 2019-02-28 NOTE — Addendum Note (Signed)
Addended by: Coral Ceo A on: 02/28/2019 04:29 PM   Modules accepted: Orders

## 2019-02-28 NOTE — Progress Notes (Signed)
Pt is here for reoccurring BV symptoms. Treated with Flagyl on 01/30/19 and pt reports symptoms of thick white vaginal discharge and odor have not been alleviated.

## 2019-03-02 LAB — CERVICOVAGINAL ANCILLARY ONLY
Bacterial Vaginitis (gardnerella): NEGATIVE
Candida Glabrata: NEGATIVE
Candida Vaginitis: NEGATIVE
Chlamydia: NEGATIVE
Comment: NEGATIVE
Comment: NEGATIVE
Comment: NEGATIVE
Comment: NEGATIVE
Comment: NEGATIVE
Comment: NORMAL
Neisseria Gonorrhea: NEGATIVE
Trichomonas: NEGATIVE

## 2019-08-07 IMAGING — CT CT NECK WITH CONTRAST
3 of 5 series · 13 of 33 positions shown, 16 images · IV contrast (omnipaque)
Comparison: CT neck 06/16/2011

CLINICAL DATA: Sore throat, stridor.  Chills and body aches, fever

EXAM:
CT NECK WITH CONTRAST
TECHNIQUE: Multidetector CT imaging of the neck was performed using the
standard protocol following the bolus administration of intravenous
contrast.
CONTRAST:  75mL OMNIPAQUE IOHEXOL 300 MG/ML  SOLN

[Series 6: ax · axial · 0.39mm/px · z∈[-266,-88]mm · 5 of 142 slices shown, 7 images]
[im 24/142  soft-tissue]
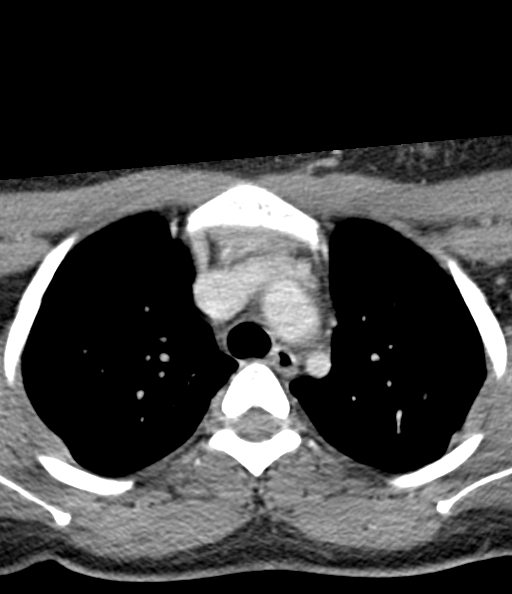
[im 24/142  bone]
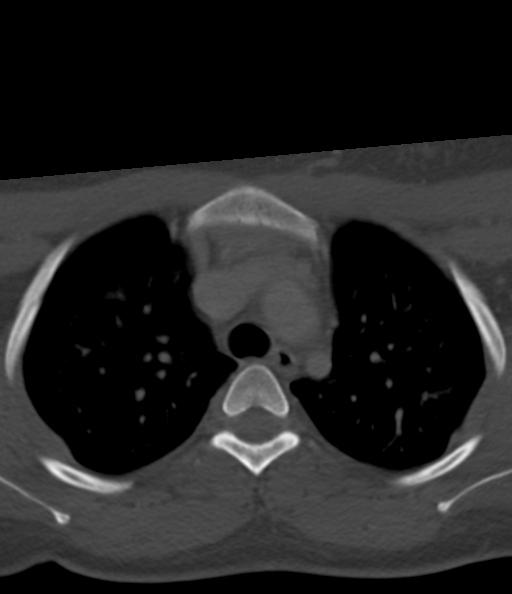
[im 48/142  bone]
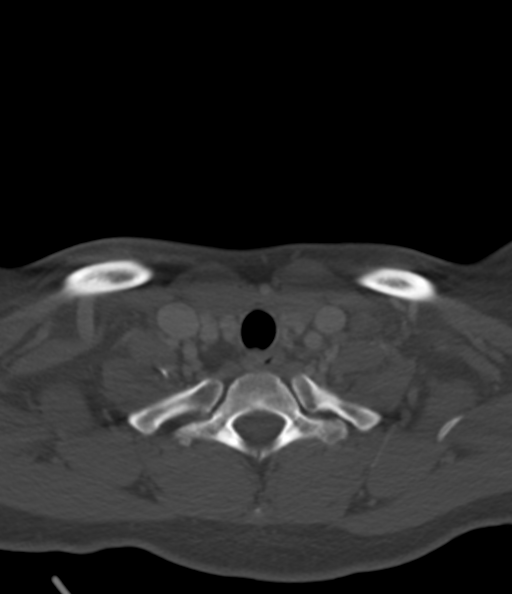
[im 71/142  bone]
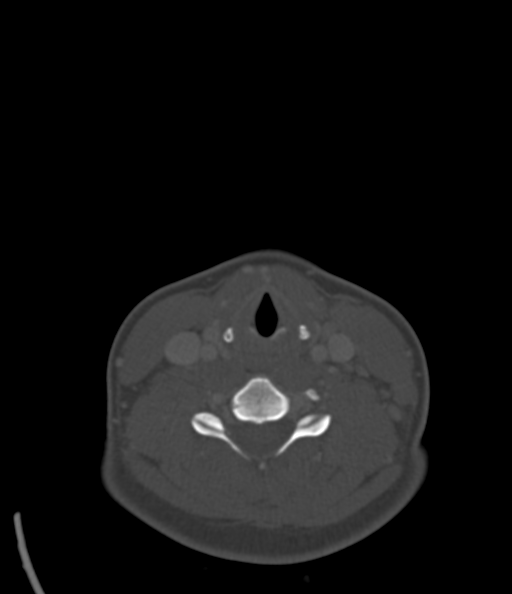
[im 95/142  bone]
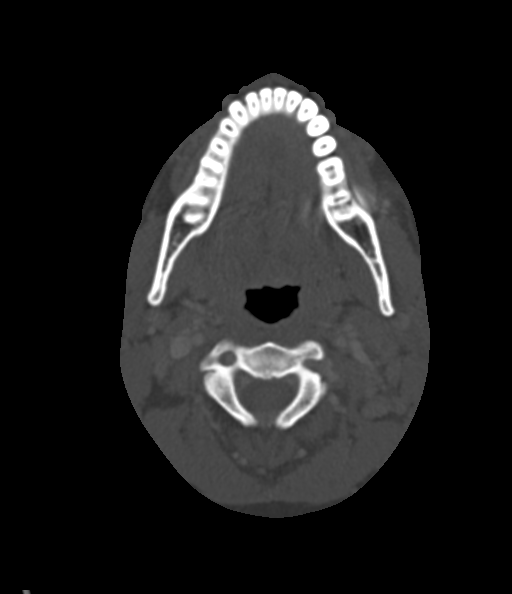
[im 118/142  soft-tissue]
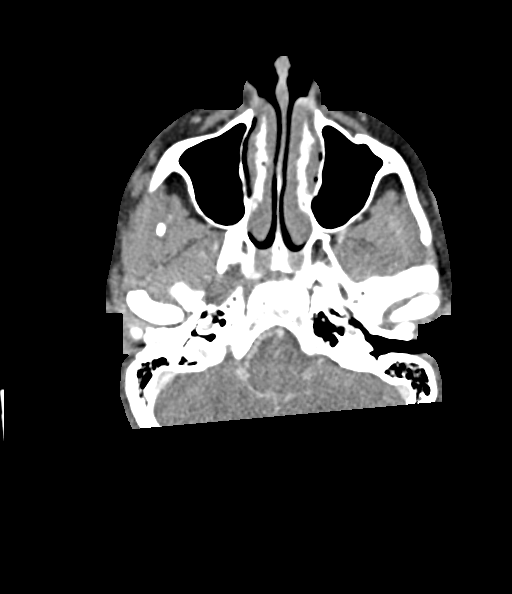
[im 118/142  bone]
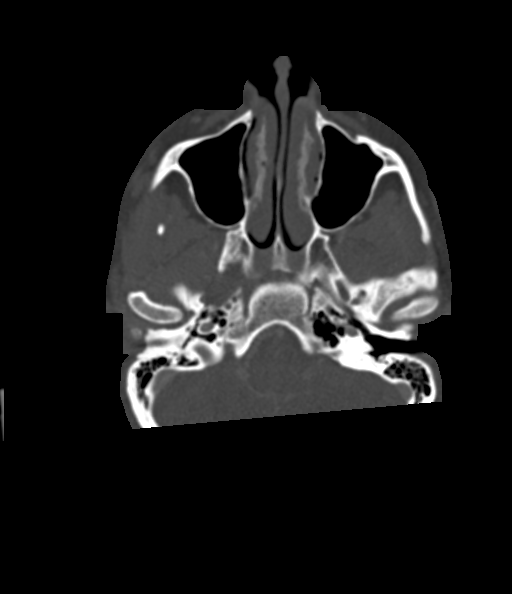

[Series 7: cor neck · coronal · 0.41mm/px · 3 of 105 slices shown]
[im 31/105  bone]
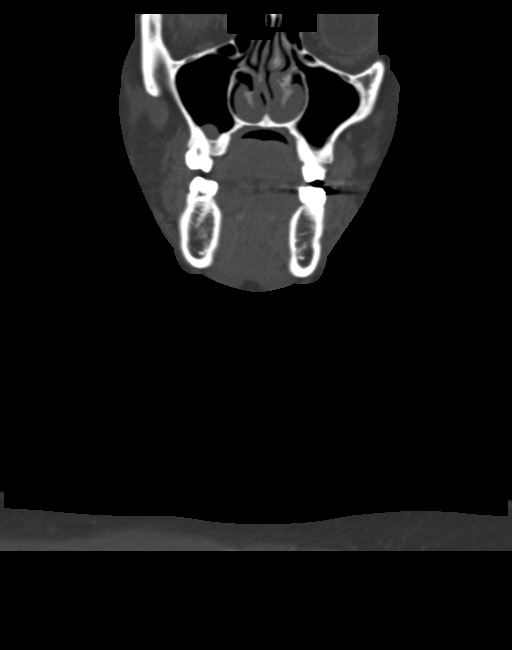
[im 45/105  bone]
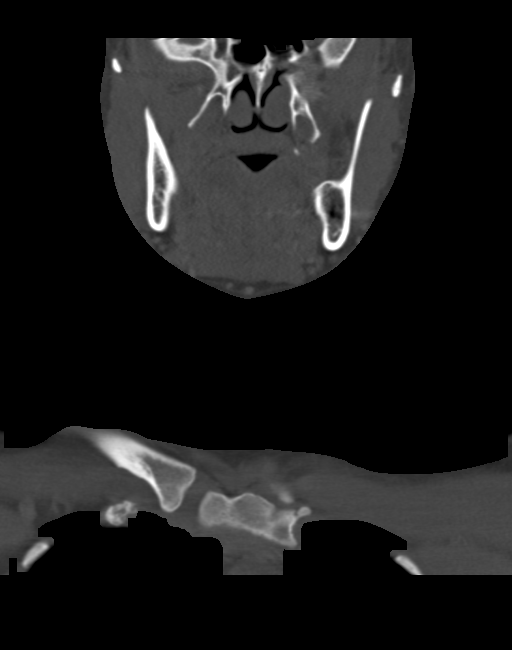
[im 60/105  bone]
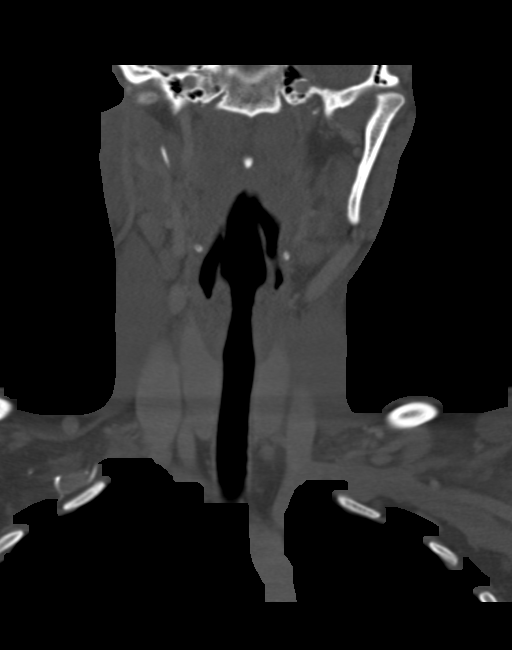

[Series 8: sag neck · sagittal · 0.42mm/px · 5 of 82 slices shown, 6 images]
[im 28/82  bone]
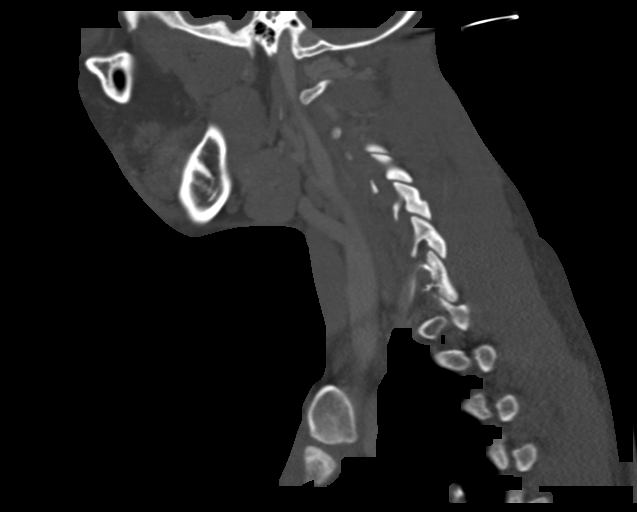
[im 34/82  bone]
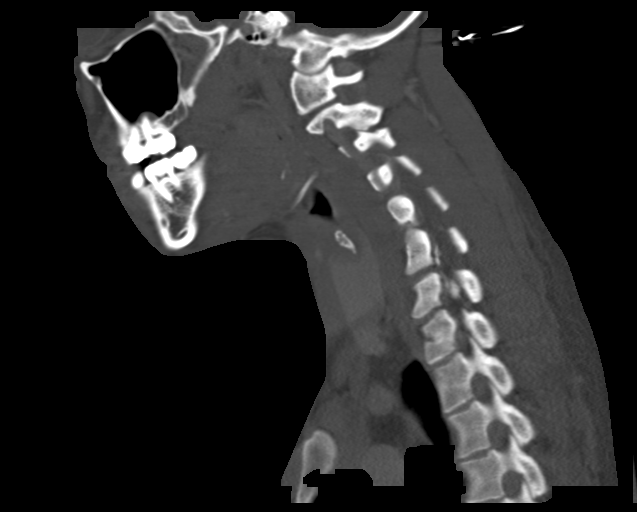
[im 41/82  soft-tissue]
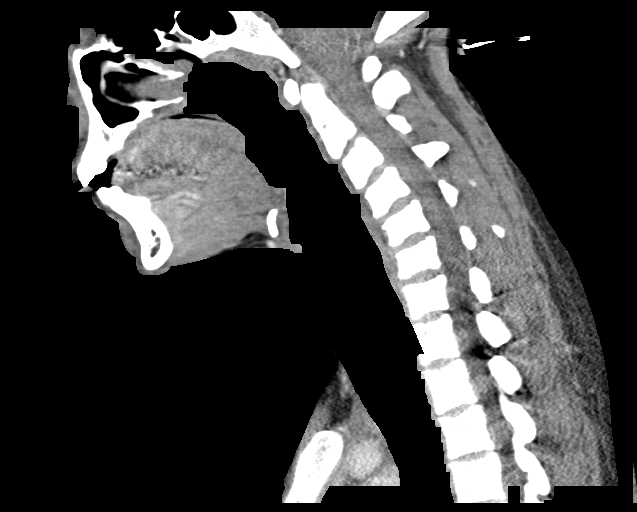
[im 41/82  bone]
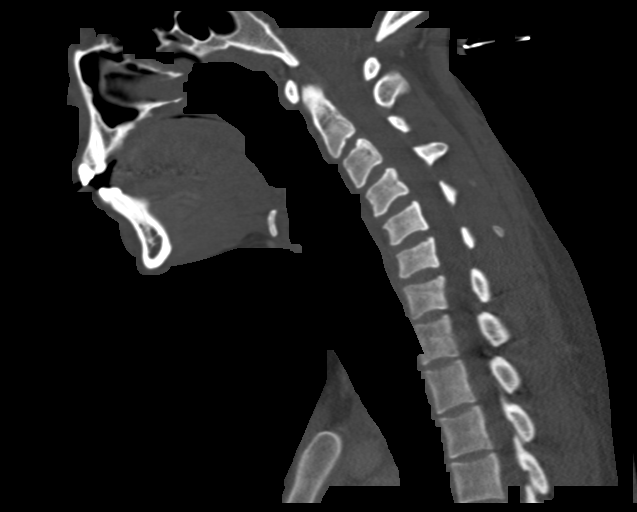
[im 48/82  bone]
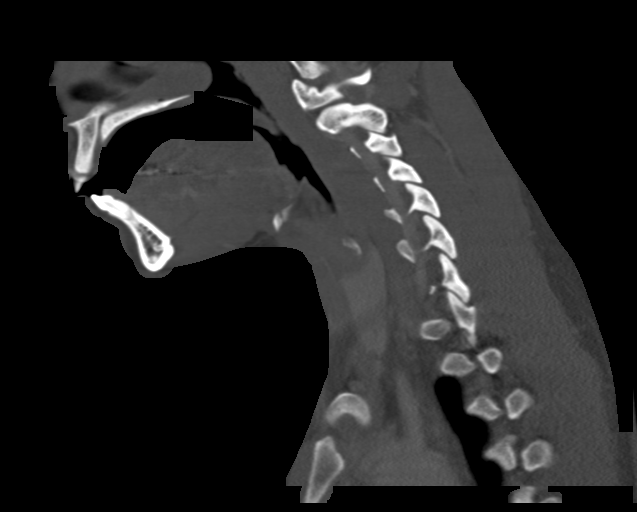
[im 55/82  bone]
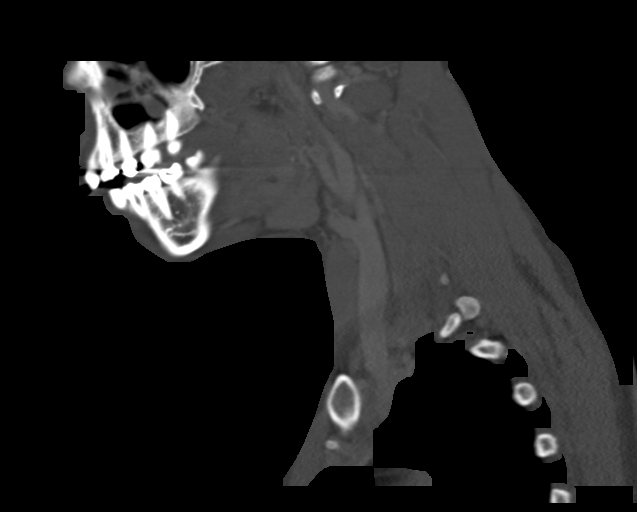

[13 of 33 positions shown; findings below may reference images not displayed]

FINDINGS: Pharynx and larynx: Normal. No mass or swelling.

Salivary glands: No inflammation, mass, or stone.

Thyroid: Negative

Lymph nodes: Left level 2 lymph node 15 mm, slightly increased from
the prior study however this node was prominent on the prior study
measuring 12 mm. Additional 8 mm left level 2 lymph node. Left
occipital posterior lymph nodes have improved in the interval now
measuring 8.6 mm and 7.6 mm.

Right level 2 node 7.2 mm.

Vascular: Normal vascular enhancement

Limited intracranial: Negative

Visualized orbits: Negative

Mastoids and visualized paranasal sinuses: Mild mucosal edema
paranasal sinuses

Skeleton: No acute skeletal abnormality. Caries right upper first
molar.

Upper chest: Lung apices clear bilaterally

Other: None
IMPRESSION: Negative for epiglottitis or tonsillitis.  Normal airway.

Mildly enlarged lymph nodes in the left neck. Patient prominent left
upper lymph nodes in 2059. Left level 2 lymph node is 15.2 mm today
and is there is clinical follow-up to check for interval growth.
Other left-sided lymph nodes are slightly smaller compared with

## 2019-08-11 ENCOUNTER — Other Ambulatory Visit (HOSPITAL_COMMUNITY)
Admission: RE | Admit: 2019-08-11 | Discharge: 2019-08-11 | Disposition: A | Discharge status: Home / Self Care | Payer: BC Managed Care – PPO | Source: Ambulatory Visit | Attending: Obstetrics and Gynecology | Admitting: Obstetrics and Gynecology

## 2019-08-11 ENCOUNTER — Other Ambulatory Visit: Payer: Self-pay

## 2019-08-11 ENCOUNTER — Ambulatory Visit (INDEPENDENT_AMBULATORY_CARE_PROVIDER_SITE_OTHER): Payer: BC Managed Care – PPO

## 2019-08-11 DIAGNOSIS — Z3687 Encounter for antenatal screening for uncertain dates: Secondary | ICD-10-CM

## 2019-08-11 DIAGNOSIS — O99891 Other specified diseases and conditions complicating pregnancy: Secondary | ICD-10-CM

## 2019-08-11 DIAGNOSIS — N898 Other specified noninflammatory disorders of vagina: Secondary | ICD-10-CM | POA: Insufficient documentation

## 2019-08-11 DIAGNOSIS — N912 Amenorrhea, unspecified: Secondary | ICD-10-CM

## 2019-08-11 DIAGNOSIS — O3680X0 Pregnancy with inconclusive fetal viability, not applicable or unspecified: Secondary | ICD-10-CM

## 2019-08-11 DIAGNOSIS — Z3481 Encounter for supervision of other normal pregnancy, first trimester: Secondary | ICD-10-CM | POA: Diagnosis not present

## 2019-08-11 DIAGNOSIS — Z3201 Encounter for pregnancy test, result positive: Secondary | ICD-10-CM | POA: Diagnosis not present

## 2019-08-11 DIAGNOSIS — Z3480 Encounter for supervision of other normal pregnancy, unspecified trimester: Secondary | ICD-10-CM | POA: Insufficient documentation

## 2019-08-11 DIAGNOSIS — Z3A01 Less than 8 weeks gestation of pregnancy: Secondary | ICD-10-CM

## 2019-08-11 LAB — POCT URINE PREGNANCY: Preg Test, Ur: POSITIVE — AB

## 2019-08-11 NOTE — Progress Notes (Signed)
Ms. Reinhardt presents today for UPT. She c/o vaginal discharge and possible yeast infection. Self swab completed today. NOB intake and ultrasound completed today.   LMP: Unsure - dating u/s completed today     OBJECTIVE: Appears well, in no apparent distress.  OB History    Gravida  5   Para  2   Term  2   Preterm      AB  2   Living  2     SAB      TAB  2   Ectopic      Multiple  0   Live Births  2          Home UPT Result:Positive  In-Office UPT result:Positive  I have reviewed the patient's medical, obstetrical, social, and family histories, and medications.   ASSESSMENT: Positive pregnancy test  PLAN Prenatal care to be completed at: [facility]  Labs/pap to be completed at [facility] provider visit  Pt will purchase BP cuff - Private ins   Download Babyscripts Pt to start PNV CT/GC/Yeast/BV swab sent

## 2019-08-13 NOTE — Progress Notes (Signed)
Patient was assessed and managed by nursing staff during this encounter. I have reviewed the chart and agree with the documentation and plan. I have also made any necessary editorial changes.  Warden Fillers, MD 08/13/2019 7:52 AM

## 2019-08-14 LAB — CERVICOVAGINAL ANCILLARY ONLY
Bacterial Vaginitis (gardnerella): NEGATIVE
Candida Glabrata: NEGATIVE
Candida Vaginitis: NEGATIVE
Chlamydia: NEGATIVE
Comment: NEGATIVE
Comment: NEGATIVE
Comment: NEGATIVE
Comment: NEGATIVE
Comment: NEGATIVE
Comment: NORMAL
Neisseria Gonorrhea: NEGATIVE
Trichomonas: NEGATIVE

## 2019-09-11 ENCOUNTER — Other Ambulatory Visit: Payer: Self-pay

## 2019-09-11 ENCOUNTER — Other Ambulatory Visit (HOSPITAL_COMMUNITY)
Admission: RE | Admit: 2019-09-11 | Discharge: 2019-09-11 | Disposition: A | Discharge status: Home / Self Care | Payer: BC Managed Care – PPO | Source: Ambulatory Visit | Attending: Advanced Practice Midwife | Admitting: Advanced Practice Midwife

## 2019-09-11 ENCOUNTER — Ambulatory Visit (INDEPENDENT_AMBULATORY_CARE_PROVIDER_SITE_OTHER): Payer: BC Managed Care – PPO | Admitting: Advanced Practice Midwife

## 2019-09-11 ENCOUNTER — Encounter: Payer: Self-pay | Admitting: Advanced Practice Midwife

## 2019-09-11 ENCOUNTER — Encounter: Payer: Self-pay | Admitting: Obstetrics

## 2019-09-11 VITALS — BP 130/75 | HR 92 | Wt 162.0 lb

## 2019-09-11 DIAGNOSIS — Z3481 Encounter for supervision of other normal pregnancy, first trimester: Secondary | ICD-10-CM | POA: Diagnosis not present

## 2019-09-11 DIAGNOSIS — Z3A11 11 weeks gestation of pregnancy: Secondary | ICD-10-CM

## 2019-09-11 DIAGNOSIS — Z3480 Encounter for supervision of other normal pregnancy, unspecified trimester: Secondary | ICD-10-CM

## 2019-09-11 DIAGNOSIS — O219 Vomiting of pregnancy, unspecified: Secondary | ICD-10-CM

## 2019-09-11 DIAGNOSIS — N393 Stress incontinence (female) (male): Secondary | ICD-10-CM

## 2019-09-11 DIAGNOSIS — B3731 Acute candidiasis of vulva and vagina: Secondary | ICD-10-CM

## 2019-09-11 DIAGNOSIS — O34219 Maternal care for unspecified type scar from previous cesarean delivery: Secondary | ICD-10-CM

## 2019-09-11 DIAGNOSIS — B373 Candidiasis of vulva and vagina: Secondary | ICD-10-CM

## 2019-09-11 DIAGNOSIS — Z124 Encounter for screening for malignant neoplasm of cervix: Secondary | ICD-10-CM

## 2019-09-11 MED ORDER — PROMETHAZINE HCL 25 MG PO TABS: 12.5000 mg | ORAL_TABLET | Freq: Every evening | ORAL | 2 refills | Status: DC | PRN

## 2019-09-11 MED ORDER — TERCONAZOLE 0.4 % VA CREA: 1.0000 | TOPICAL_CREAM | Freq: Every day | VAGINAL | 0 refills | Status: DC

## 2019-09-11 MED ORDER — METOCLOPRAMIDE HCL 10 MG PO TABS: 10.0000 mg | ORAL_TABLET | Freq: Three times a day (TID) | ORAL | 2 refills | Status: DC | PRN

## 2019-09-11 NOTE — Progress Notes (Signed)
Subjective:   Cheyenne Phillips is a 29 y.o. [id number] at [redacted]w[redacted]d by LMP being seen today for her first obstetrical visit.  Her obstetrical history is significant for C/s x 1 followed by successful VBAC and has History of cesarean delivery and Supervision of other normal pregnancy, antepartum on their problem list.. Patient does intend to breast feed. Pregnancy history fully reviewed.  Patient reports n/v, back pain, agitation/frustration at work, and urinary incontinence when vomiting.  HISTORY: OB History  Gravida Para Term Preterm AB Living  5 2 2  0 2 2  SAB TAB Ectopic Multiple Live Births  0 2 0 0 2    # Outcome Date GA Lbr Len/2nd Weight Sex Delivery Anes PTL Lv  5 Current           4 TAB 12/12/16          3 TAB 04/11/16          2 Term 11/20/14 [redacted]w[redacted]d 10:08 / 00:19 7 lb 1 oz (3.204 kg) F VBAC EPI  LIV     Name: [facility]     Apgar1: 9  Apgar5: 9  1 Term 05/31/13 [redacted]w[redacted]d  6 lb 12.8 oz (3.084 kg) F CS-LTranv EPI  LIV     Name: LYANN, HAGSTROM     Apgar1: 8  Apgar5: 9   Past Medical History:  Diagnosis Date  . Medical history non-contributory   . Seasonal allergies   . Sickle cell trait [facility])    Past Surgical History:  Procedure Laterality Date  . CESAREAN SECTION N/A 05/31/2013   Procedure: CESAREAN SECTION;  Surgeon: 06/02/2013, MD;  Location: [facility];  Service: Obstetrics;  Laterality: N/A;  . WISDOM TOOTH EXTRACTION     Family History  Problem Relation Age of Onset  . Hypertension Maternal Grandmother   . Hyperlipidemia Maternal Grandmother   . Arthritis Maternal Grandmother   . Asthma Maternal Grandmother    Social History   Tobacco Use  . Smoking status: Former Smoker    Packs/day: 1.00  . Smokeless tobacco: Never Used  Vaping Use  . Vaping Use: Never used  Substance Use Topics  . Alcohol use: Not Currently    Alcohol/week: 0.0 standard drinks  . Drug use: Yes   No Known Allergies Current Outpatient Medications on File Prior  to Visit  Medication Sig Dispense Refill  . fluconazole (DIFLUCAN) 150 MG tablet Take one by mouth on Day 1 and Day 4 (Patient not taking: Reported on 08/11/2019) 2 tablet 4  . ibuprofen (ADVIL,MOTRIN) 200 MG tablet Take 400 mg by mouth every 6 (six) hours as needed for moderate pain.    . LO LOESTRIN FE 1 MG-10 MCG / 10 MCG tablet Take 1 tablet by mouth daily. Start taking on 1st day of period. (Patient not taking: Reported on 08/11/2019) 3 Package 4  . medroxyPROGESTERone (DEPO-PROVERA) 150 MG/ML injection Inject 1 mL (150 mg total) into the muscle every 3 (three) months. (Patient not taking: Reported on 08/11/2019) 1 mL 3  . metroNIDAZOLE (FLAGYL) 500 MG tablet Take 1 tablet (500 mg total) by mouth 2 (two) times daily. (Patient not taking: Reported on 02/28/2019) 14 tablet 0  . tinidazole (TINDAMAX) 500 MG tablet Take 2 tablets (1,000 mg total) by mouth daily with breakfast. (Patient not taking: Reported on 08/11/2019) 10 tablet 4   Current Facility-Administered Medications on File Prior to Visit  Medication Dose Route Frequency Provider Last Rate Last Admin  . medroxyPROGESTERone (DEPO-PROVERA)  injection 150 mg  150 mg Intramuscular Q90 days Hermina Staggers, MD   150 mg at 03/02/18 1610     Indications for ASA therapy (per uptodate) One of the following: Previous pregnancy with preeclampsia, especially early onset and with an adverse outcome No Multifetal gestation No Chronic hypertension No Type 1 or 2 diabetes mellitus No Chronic kidney disease No Autoimmune disease (antiphospholipid syndrome, systemic lupus erythematosus) No   Two or more of the following: Nulliparity No Obesity (body mass index >30 kg/m2) No Family history of preeclampsia in mother or sister No Age ?35 years No Sociodemographic characteristics (African American race, low socioeconomic level) Yes Personal risk factors (eg, previous pregnancy with low birth weight or small for gestational age infant, previous adverse  pregnancy outcome [eg, stillbirth], interval >10 years between pregnancies) No   Indications for early 1 hour GTT (per uptodate)  BMI >25 (>23 in Asian women) AND one of the following  Gestational diabetes mellitus in a previous pregnancy No Glycated hemoglobin ?5.7 percent (39 mmol/mol), impaired glucose tolerance, or impaired fasting glucose on previous testing No First-degree relative with diabetes No High-risk race/ethnicity (eg, African American, Latino, Native American, [place] American, Pacific Islander) No History of cardiovascular disease No Hypertension or on therapy for hypertension No High-density lipoprotein cholesterol level <35 mg/dL (9.60 mmol/L) and/or a triglyceride level >250 mg/dL (4.54 mmol/L) No Polycystic ovary syndrome No Physical inactivity No Other clinical condition associated with insulin resistance (eg, severe obesity, acanthosis nigricans) No Previous birth of an infant weighing ?4000 g No Previous stillbirth of unknown cause No Exam   Vitals:   09/11/19 1020  BP: 130/75  Pulse: 92  Weight: 162 lb (73.5 kg)   Fetal Heart Rate (bpm): 158  Uterus:     Pelvic Exam: Perineum: no hemorrhoids, normal perineum   Vulva: normal external genitalia, no lesions   Vagina:  normal mucosa, normal discharge   Cervix: no lesions and normal, pap smear done.    Adnexa: normal adnexa and no mass, fullness, tenderness   Bony Pelvis: average  System: General: well-developed, well-nourished female in no acute distress   Breast:  normal appearance, no masses or tenderness   Skin: normal coloration and turgor, no rashes   Neurologic: oriented, normal, negative, normal mood   Extremities: normal strength, tone, and muscle mass, ROM of all joints is normal   HEENT PERRLA, extraocular movement intact and sclera clear, anicteric   Mouth/Teeth mucous membranes moist, pharynx normal without lesions and dental hygiene good   Neck supple and no masses   Cardiovascular: regular  rate and rhythm   Respiratory:  no respiratory distress, normal breath sounds   Abdomen: soft, non-tender; bowel sounds normal; no masses,  no organomegaly     Assessment:   Pregnancy: [id number] Patient Active Problem List   Diagnosis Date Noted  . Supervision of other normal pregnancy, antepartum 08/11/2019  . History of cesarean delivery 11/09/2018     Plan:  1. Supervision of other normal pregnancy, antepartum --Anticipatory guidance about next visits/weeks of pregnancy given. --Next visit in 4 weeks, in office for AFP  - CBC/D/Plt+RPR+Rh+ABO+Rub Ab... - Culture, OB Urine - Genetic Screening - Cervicovaginal ancillary only( [place]) - Enroll Patient in Babyscripts --Pap. Pt last Pap 2019 but desires now, and if normal will be due in 3 years.   2. History of cesarean delivery, currently pregnant --Desires repeat C/S currently but is open to discussion  3. [redacted] weeks gestation of pregnancy   4. Urinary, incontinence,  stress female --Has never had this before but is having incontinence when vomiting.  Discussed options and pt desires PT to improve this now and for later after pregnancy. - Ambulatory referral to Physical Therapy  5. Nausea and vomiting during pregnancy prior to [redacted] weeks gestation --Pt reports sleeping difficulties and difficulty keeping down food.  Will try Phenergan at night for nausea and to aid sleep and Reglan during the day. --Small frequent meals and other dietary changes reviewed  - promethazine (PHENERGAN) 25 MG tablet; Take 0.5-1 tablets (12.5-25 mg total) by mouth at bedtime as needed for nausea.  Dispense: 30 tablet; Refill: 2 - metoCLOPramide (REGLAN) 10 MG tablet; Take 1 tablet (10 mg total) by mouth 3 (three) times daily as needed for nausea.  Dispense: 30 tablet; Refill: 2  6. Vaginal candidiasis --Pt with symptoms and evidence on clinical exam today of yeast.  - terconazole (TERAZOL 7) 0.4 % vaginal cream; Place 1 applicator vaginally at  bedtime.  Dispense: 45 g; Refill: 0    Initial labs drawn. Continue prenatal vitamins. Discussed and offered genetic screening options, including Quad screen/AFP, NIPS testing, and option to decline testing. Benefits/risks/alternatives reviewed. Pt aware that anatomy US is form of genetic screening with lower accuracy in detecting trisomies than blood work.  Pt chooses genetic screening today but is waiting for insurance approvalNIPS: ordered. Ultrasound discussed; fetal anatomic survey: requested. Problem list reviewed and updated. The nature of [facility] with multiple MDs and other Advanced Practice Providers was explained to patient; also emphasized that residents, students are part of our team. Routine obstetric precautions reviewed. Return in about 4 weeks (around 10/09/2019).   Sharen Counter, CNM 09/11/19 12:04 PM

## 2019-09-11 NOTE — Patient Instructions (Signed)
First Trimester of Pregnancy The first trimester of pregnancy is from week 1 until the end of week 13 (months 1 through 3). A week after a sperm fertilizes an egg, the egg will implant on the wall of the uterus. This embryo will begin to develop into a baby. Genes from you and your partner will form the baby. The female genes will determine whether the baby will be a boy or a girl. At 6-8 weeks, the eyes and face will be formed, and the heartbeat can be seen on ultrasound. At the end of 12 weeks, all the baby's organs will be formed. Now that you are pregnant, you will want to do everything you can to have a healthy baby. Two of the most important things are to get good prenatal care and to follow your health care provider's instructions. Prenatal care is all the medical care you receive before the baby's birth. This care will help prevent, find, and treat any problems during the pregnancy and childbirth. Body changes during your first trimester Your body goes through many changes during pregnancy. The changes vary from woman to woman.  You may gain or lose a couple of pounds at first.  You may feel sick to your stomach (nauseous) and you may throw up (vomit). If the vomiting is uncontrollable, call your health care provider.  You may tire easily.  You may develop headaches that can be relieved by medicines. All medicines should be approved by your health care provider.  You may urinate more often. Painful urination may mean you have a bladder infection.  You may develop heartburn as a result of your pregnancy.  You may develop constipation because certain hormones are causing the muscles that push stool through your intestines to slow down.  You may develop hemorrhoids or swollen veins (varicose veins).  Your breasts may begin to grow larger and become tender. Your nipples may stick out more, and the tissue that surrounds them (areola) may become darker.  Your gums may bleed and may be  sensitive to brushing and flossing.  Dark spots or blotches (chloasma, mask of pregnancy) may develop on your face. This will likely fade after the baby is born.  Your menstrual periods will stop.  You may have a loss of appetite.  You may develop cravings for certain kinds of food.  You may have changes in your emotions from day to day, such as being excited to be pregnant or being concerned that something may go wrong with the pregnancy and baby.  You may have more vivid and strange dreams.  You may have changes in your hair. These can include thickening of your hair, rapid growth, and changes in texture. Some women also have hair loss during or after pregnancy, or hair that feels dry or thin. Your hair will most likely return to normal after your baby is born. What to expect at prenatal visits During a routine prenatal visit:  You will be weighed to make sure you and the baby are growing normally.  Your blood pressure will be taken.  Your abdomen will be measured to track your baby's growth.  The fetal heartbeat will be listened to between weeks 10 and 14 of your pregnancy.  Test results from any previous visits will be discussed. Your health care provider may ask you:  How you are feeling.  If you are feeling the baby move.  If you have had any abnormal symptoms, such as leaking fluid, bleeding, severe headaches, or abdominal   cramping.  If you are using any tobacco products, including cigarettes, chewing tobacco, and electronic cigarettes.  If you have any questions. Other tests that may be performed during your first trimester include:  Blood tests to find your blood type and to check for the presence of any previous infections. The tests will also be used to check for low iron levels (anemia) and protein on red blood cells (Rh antibodies). Depending on your risk factors, or if you previously had diabetes during pregnancy, you may have tests to check for high blood sugar  that affects pregnant women (gestational diabetes).  Urine tests to check for infections, diabetes, or protein in the urine.  An ultrasound to confirm the proper growth and development of the baby.  Fetal screens for spinal cord problems (spina bifida) and Down syndrome.  HIV (human immunodeficiency virus) testing. Routine prenatal testing includes screening for HIV, unless you choose not to have this test.  You may need other tests to make sure you and the baby are doing well. Follow these instructions at home: Medicines  Follow your health care provider's instructions regarding medicine use. Specific medicines may be either safe or unsafe to take during pregnancy.  Take a prenatal vitamin that contains at least 600 micrograms (mcg) of folic acid.  If you develop constipation, try taking a stool softener if your health care provider approves. Eating and drinking   Eat a balanced diet that includes fresh fruits and vegetables, whole grains, good sources of protein such as meat, eggs, or tofu, and low-fat dairy. Your health care provider will help you determine the amount of weight gain that is right for you.  Avoid raw meat and uncooked cheese. These carry germs that can cause birth defects in the baby.  Eating four or five small meals rather than three large meals a day may help relieve nausea and vomiting. If you start to feel nauseous, eating a few soda crackers can be helpful. Drinking liquids between meals, instead of during meals, also seems to help ease nausea and vomiting.  Limit foods that are high in fat and processed sugars, such as fried and sweet foods.  To prevent constipation: ? Eat foods that are high in fiber, such as fresh fruits and vegetables, whole grains, and beans. ? Drink enough fluid to keep your urine clear or pale yellow. Activity  Exercise only as directed by your health care provider. Most women can continue their usual exercise routine during  pregnancy. Try to exercise for 30 minutes at least 5 days a week. Exercising will help you: ? Control your weight. ? Stay in shape. ? Be prepared for labor and delivery.  Experiencing pain or cramping in the lower abdomen or lower back is a good sign that you should stop exercising. Check with your health care provider before continuing with normal exercises.  Try to avoid standing for long periods of time. Move your legs often if you must stand in one place for a long time.  Avoid heavy lifting.  Wear low-heeled shoes and practice good posture.  You may continue to have sex unless your health care provider tells you not to. Relieving pain and discomfort  Wear a good support bra to relieve breast tenderness.  Take warm sitz baths to soothe any pain or discomfort caused by hemorrhoids. Use hemorrhoid cream if your health care provider approves.  Rest with your legs elevated if you have leg cramps or low back pain.  If you develop varicose veins in   your legs, wear support hose. Elevate your feet for 15 minutes, 3-4 times a day. Limit salt in your diet. Prenatal care  Schedule your prenatal visits by the twelfth week of pregnancy. They are usually scheduled monthly at first, then more often in the last 2 months before delivery.  Write down your questions. Take them to your prenatal visits.  Keep all your prenatal visits as told by your health care provider. This is important. Safety  Wear your seat belt at all times when driving.  Make a list of emergency phone numbers, including numbers for family, friends, the hospital, and police and fire departments. General instructions  Ask your health care provider for a referral to a local prenatal education class. Begin classes no later than the beginning of month 6 of your pregnancy.  Ask for help if you have counseling or nutritional needs during pregnancy. Your health care provider can offer advice or refer you to specialists for help  with various needs.  Do not use hot tubs, steam rooms, or saunas.  Do not douche or use tampons or scented sanitary pads.  Do not cross your legs for long periods of time.  Avoid cat litter boxes and soil used by cats. These carry germs that can cause birth defects in the baby and possibly loss of the fetus by miscarriage or stillbirth.  Avoid all smoking, herbs, alcohol, and medicines not prescribed by your health care provider. Chemicals in these products affect the formation and growth of the baby.  Do not use any products that contain nicotine or tobacco, such as cigarettes and e-cigarettes. If you need help quitting, ask your health care provider. You may receive counseling support and other resources to help you quit.  Schedule a dentist appointment. At home, brush your teeth with a soft toothbrush and be gentle when you floss. Contact a health care provider if:  You have dizziness.  You have mild pelvic cramps, pelvic pressure, or nagging pain in the abdominal area.  You have persistent nausea, vomiting, or diarrhea.  You have a bad smelling vaginal discharge.  You have pain when you urinate.  You notice increased swelling in your face, hands, legs, or ankles.  You are exposed to fifth disease or chickenpox.  You are exposed to German measles (rubella) and have never had it. Get help right away if:  You have a fever.  You are leaking fluid from your vagina.  You have spotting or bleeding from your vagina.  You have severe abdominal cramping or pain.  You have rapid weight gain or loss.  You vomit blood or material that looks like coffee grounds.  You develop a severe headache.  You have shortness of breath.  You have any kind of trauma, such as from a fall or a car accident. Summary  The first trimester of pregnancy is from week 1 until the end of week 13 (months 1 through 3).  Your body goes through many changes during pregnancy. The changes vary from  woman to woman.  You will have routine prenatal visits. During those visits, your health care provider will examine you, discuss any test results you may have, and talk with you about how you are feeling. This information is not intended to replace advice given to you by your health care provider. Make sure you discuss any questions you have with your health care provider. Document Revised: 12/18/2016 Document Reviewed: 12/18/2015 Elsevier Patient Education  2020 Elsevier Inc.  

## 2019-09-11 NOTE — Progress Notes (Signed)
NOB  Genetic Testing: Desires Pt given [facility] form and Green card with [facility] contact and pricing pt made aware to le me know if she wants to proceed with Lab.   Last Pap: 10/12/17 WNL   CC: Lower Back pain, denies any dysuria,  No pelvic pain. Pt recently tried OTC Monistat "One Day" pt states it was very irritating, pt believes she may have a yeast infection. Pt would like vaginal swab.  Pt states at work her job load is too much pt wants to discuss restrictions.  Also notes she may have a desire to eat, but once she starts to eat she cannot eat.

## 2019-09-12 LAB — CBC/D/PLT+RPR+RH+ABO+RUB AB...
Antibody Screen: NEGATIVE
Basophils Absolute: 0 10*3/uL (ref 0.0–0.2)
Basos: 0 %
EOS (ABSOLUTE): 0.1 10*3/uL (ref 0.0–0.4)
Eos: 1 %
HCV Ab: <0.1 s/co ratio (ref 0.0–0.9)
HIV Screen 4th Generation wRfx: NONREACTIVE
Hematocrit: 35.2 % (ref 34.0–46.6)
Hemoglobin: 12 g/dL (ref 11.1–15.9)
Hepatitis B Surface Ag: NEGATIVE
Immature Grans (Abs): 0 10*3/uL (ref 0.0–0.1)
Immature Granulocytes: 0 %
Lymphocytes Absolute: 1.1 10*3/uL (ref 0.7–3.1)
Lymphs: 16 %
MCH: 28.3 pg (ref 26.6–33.0)
MCHC: 34.1 g/dL (ref 31.5–35.7)
MCV: 83 fL (ref 79–97)
Monocytes Absolute: 0.8 10*3/uL (ref 0.1–0.9)
Monocytes: 12 %
Neutrophils Absolute: 5 10*3/uL (ref 1.4–7.0)
Neutrophils: 71 %
Platelets: 193 10*3/uL (ref 150–450)
RBC: 4.24 x10E6/uL (ref 3.77–5.28)
RDW: 15.6 % — ABNORMAL HIGH (ref 11.7–15.4)
RPR Ser Ql: NONREACTIVE
Rh Factor: POSITIVE
Rubella Antibodies, IGG: 1.95 index (ref >0.99)
WBC: 7 10*3/uL (ref 3.4–10.8)

## 2019-09-12 LAB — CERVICOVAGINAL ANCILLARY ONLY
Bacterial Vaginitis (gardnerella): NEGATIVE
Candida Glabrata: NEGATIVE
Candida Vaginitis: NEGATIVE
Chlamydia: NEGATIVE
Comment: NEGATIVE
Comment: NEGATIVE
Comment: NEGATIVE
Comment: NEGATIVE
Comment: NEGATIVE
Comment: NORMAL
Neisseria Gonorrhea: NEGATIVE
Trichomonas: NEGATIVE

## 2019-09-12 LAB — HCV INTERPRETATION

## 2019-09-13 LAB — URINE CULTURE, OB REFLEX

## 2019-09-13 LAB — CULTURE, OB URINE

## 2019-09-14 LAB — CYTOLOGY - PAP
Comment: NEGATIVE
Diagnosis: UNDETERMINED — AB
High risk HPV: POSITIVE — AB

## 2019-09-18 ENCOUNTER — Other Ambulatory Visit: Payer: Self-pay | Admitting: *Deleted

## 2019-09-18 ENCOUNTER — Encounter: Payer: Self-pay | Admitting: Advanced Practice Midwife

## 2019-09-18 DIAGNOSIS — R87610 Atypical squamous cells of undetermined significance on cytologic smear of cervix (ASC-US): Secondary | ICD-10-CM | POA: Insufficient documentation

## 2019-09-18 MED ORDER — BLOOD PRESSURE MONITOR KIT: 1.0000 | PACK | Freq: Once | 0 refills | Status: AC

## 2019-09-18 NOTE — Progress Notes (Signed)
BP cuff ordered for pt today.  Pt states she has [employer] and [employer].  Reviewed pap results with pt and possible need for colpo.

## 2019-09-20 ENCOUNTER — Encounter: Payer: Self-pay | Admitting: Advanced Practice Midwife

## 2019-10-03 ENCOUNTER — Telehealth: Payer: Self-pay

## 2019-10-03 NOTE — Telephone Encounter (Signed)
Received call from patient- she reports she has been able to work due to migraines and she feels her blood pressure is to high to work. She would like to talk to Misty Stanley about coming out of work because of her symptoms.She stated I cant work like this and my blood pressure is high sometimes.  I advised patient she will need an appointment to discuss with her provider about her concerns. She has appointment on 10/09/2019 and will wait to discuss at that time. I did advised patient the provider will not take her out of work unless it medically necessary. She reports taking tyenlol with no relief for headaches.

## 2019-10-09 ENCOUNTER — Ambulatory Visit (INDEPENDENT_AMBULATORY_CARE_PROVIDER_SITE_OTHER): Payer: BC Managed Care – PPO | Admitting: Women's Health

## 2019-10-09 ENCOUNTER — Other Ambulatory Visit: Payer: Self-pay

## 2019-10-09 VITALS — BP 118/72 | HR 84 | Wt 164.8 lb

## 2019-10-09 DIAGNOSIS — B3731 Acute candidiasis of vulva and vagina: Secondary | ICD-10-CM | POA: Insufficient documentation

## 2019-10-09 DIAGNOSIS — B373 Candidiasis of vulva and vagina: Secondary | ICD-10-CM

## 2019-10-09 DIAGNOSIS — Z3480 Encounter for supervision of other normal pregnancy, unspecified trimester: Secondary | ICD-10-CM | POA: Diagnosis not present

## 2019-10-09 DIAGNOSIS — R87610 Atypical squamous cells of undetermined significance on cytologic smear of cervix (ASC-US): Secondary | ICD-10-CM

## 2019-10-09 DIAGNOSIS — R87810 Cervical high risk human papillomavirus (HPV) DNA test positive: Secondary | ICD-10-CM

## 2019-10-09 DIAGNOSIS — D573 Sickle-cell trait: Secondary | ICD-10-CM | POA: Insufficient documentation

## 2019-10-09 DIAGNOSIS — Z98891 History of uterine scar from previous surgery: Secondary | ICD-10-CM

## 2019-10-09 MED ORDER — TERCONAZOLE 0.4 % VA CREA: 1.0000 | TOPICAL_CREAM | Freq: Every day | VAGINAL | 0 refills | Status: AC

## 2019-10-09 NOTE — Progress Notes (Signed)
Patient presents for ROB and Colpo for ASCUS. Patient complains of having sharp lower abdominal pains, and pelvic pain.   Patient would like to defer flu vaccine until after she completes her Covid vaccine series.

## 2019-10-09 NOTE — Progress Notes (Addendum)
Cheyenne Phillips is a 29 y.o. [id number] at [redacted]w[redacted]d here for colposcopy with possible cerivcal biopsy after Pap showed ASCUS/+hrHPV.  Discussed with pt reason for colposcopy, risks/benefits, how procedure is performed, post-procedure expecations, warning signs of excessive bleeding/infection. Pt verbalized understanding and questions answered to pt satisfaction. Informed consent signed. Pt is not on anti-coagulant medication. NKDA. Clinically apparent yeast infection.  Pt confirms no douching, PV meds or intercourse in last 24hrs. Speculum inserted and cervix fully visualized. Excess mucus/yeast removed gently with scopette. Acetic acid 5% applied x3. Vulva, vagina, cervix examined with and without colposcope. Entire transformation zone and SCJ not visualized - inadequate colposcopy. Acetowhite changes visualized at 4 o'clock and 7 o'clock. Green filter applied to enhance visualization of blood vessels - no change.  4 o'clock lesion view entirely, mild acetowhite changes. 7 o'clock holds multiple, small satellite lesions, viewed entirely, mild acetowhite changes.  Cervical biopsy not performed today d/t pregnancy and mild appearance of lesions c/w ASCUS Pap. Will repeat Pap ppartum.  Information on colposcopy given to patient.  Marylen Ponto, NP  11:39 AM 10/09/2019

## 2019-10-09 NOTE — Progress Notes (Signed)
Subjective:  Cheyenne Phillips is a 29 y.o. [id number] at [redacted]w[redacted]d being seen today for ongoing prenatal care.  She is currently monitored for the following issues for this low-risk pregnancy and has History of cesarean delivery; Supervision of other normal pregnancy, antepartum; ASCUS with positive high risk HPV cervical; Sickle cell trait (HCC); and Vaginal yeast infection on their problem list.  Patient reports no complaints.  Contractions: Not present. Vag. Bleeding: None.  Movement: Present. Denies leaking of fluid.   The following portions of the patient's history were reviewed and updated as appropriate: allergies, current medications, past family history, past medical history, past social history, past surgical history and problem list. Problem list updated.  Objective:   Vitals:   10/09/19 1038  BP: 118/72  Pulse: 84  Weight: 164 lb 12.8 oz (74.8 kg)    Fetal Status: Fetal Heart Rate (bpm): 150   Movement: Present     General:  Alert, oriented and cooperative. Patient is in no acute distress.  Skin: Skin is warm and dry. No rash noted.   Cardiovascular: Normal heart rate noted  Respiratory: Normal respiratory effort, no problems with respiration noted  Abdomen: Soft, gravid, appropriate for gestational age. Pain/Pressure: Present     Pelvic: Vag. Bleeding: None     colpo        Extremities: Normal range of motion.  Edema: None  Mental Status: Normal mood and affect. Normal behavior. Normal judgment and thought content.   Urinalysis:      Assessment and Plan:  Pregnancy: [id number] at [redacted]w[redacted]d  1. Supervision of other normal pregnancy, antepartum -AFP today -pt had first vaccine in COVID series, deferring flu vaccine until after completion per pt request  2. History of cesarean delivery - desires repeat C/S  3. ASCUS with positive high risk HPV cervical - colpo today, see separate procedure note -repeat colpo ppartum  4. Sickle cell trait (HCC) - AMB [facility]  REFERRAL - Urine Culture  5. Vaginal yeast infection - clinically apparent yeast infection during colpo, pt reports recent irritation - terconazole (TERAZOL 7) 0.4 % vaginal cream; Place 1 applicator vaginally at bedtime for 7 days.  Dispense: 45 g; Refill: 0   Preterm labor symptoms and general obstetric precautions including but not limited to vaginal bleeding, contractions, leaking of fluid and fetal movement were reviewed in detail with the patient. I discussed the assessment and treatment plan with the patient. The patient was provided an opportunity to ask questions and all were answered. The patient agreed with the plan and demonstrated an understanding of the instructions. The patient was advised to call back or seek an in-person office evaluation/go to MAU at [facility] for any urgent or concerning symptoms. Please refer to After Visit Summary for other counseling recommendations.  Return in about 4 weeks (around 11/06/2019) for in-person LOB/APP OK, needs genetic counseling appt.   Grier Vu, Odie Sera, NP

## 2019-10-09 NOTE — Patient Instructions (Signed)
Maternity Assessment Unit (MAU)  The Maternity Assessment Unit (MAU) is located at the [facility]. The address is: [address], [place], [place], [place] [address]. Please see map below for additional directions.    The Maternity Assessment Unit is designed to help you during your pregnancy, and for up to 6 weeks after delivery, with any pregnancy- or postpartum-related emergencies, if you think you are in labor, or if your water has broken. For example, if you experience nausea and vomiting, vaginal bleeding, severe abdominal or pelvic pain, elevated blood pressure or other problems related to your pregnancy or postpartum time, please come to the Maternity Assessment Unit for assistance.        Preterm Labor and Birth Information  The normal length of a pregnancy is 39-41 weeks. Preterm labor is when labor starts before 37 completed weeks of pregnancy. What are the risk factors for preterm labor? Preterm labor is more likely to occur in women who:  Have certain infections during pregnancy such as a bladder infection, sexually transmitted infection, or infection inside the uterus (chorioamnionitis).  Have a shorter-than-normal cervix.  Have gone into preterm labor before.  Have had surgery on their cervix.  Are younger than age 25 or older than age 74.  Are African American.  Are pregnant with twins or multiple babies (multiple gestation).  Take street drugs or smoke while pregnant.  Do not gain enough weight while pregnant.  Became pregnant shortly after having been pregnant. What are the symptoms of preterm labor? Symptoms of preterm labor include:  Cramps similar to those that can happen during a menstrual period. The cramps may happen with diarrhea.  Pain in the abdomen or lower back.  Regular uterine contractions that may feel like tightening of the abdomen.  A feeling of increased pressure in the  pelvis.  Increased watery or bloody mucus discharge from the vagina.  Water breaking (ruptured amniotic sac). Why is it important to recognize signs of preterm labor? It is important to recognize signs of preterm labor because babies who are born prematurely may not be fully developed. This can put them at an increased risk for:  Long-term (chronic) heart and lung problems.  Difficulty immediately after birth with regulating body systems, including blood sugar, body temperature, heart rate, and breathing rate.  Bleeding in the brain.  Cerebral palsy.  Learning difficulties.  Death. These risks are highest for babies who are born before 34 weeks of pregnancy. How is preterm labor treated? Treatment depends on the length of your pregnancy, your condition, and the health of your baby. It may involve:  Having a stitch (suture) placed in your cervix to prevent your cervix from opening too early (cerclage).  Taking or being given medicines, such as: ? Hormone medicines. These may be given early in pregnancy to help support the pregnancy. ? Medicine to stop contractions. ? Medicines to help mature the babys lungs. These may be prescribed if the risk of delivery is high. ? Medicines to prevent your baby from developing cerebral palsy. If the labor happens before 34 weeks of pregnancy, you may need to stay in the hospital. What should I do if I think I am in preterm labor? If you think that you are going into preterm labor, call your health care provider right away. How can I prevent preterm labor in future pregnancies? To increase your chance of having a full-term pregnancy:  Do not use any tobacco products, such as  cigarettes, chewing tobacco, and e-cigarettes. If you need help quitting, ask your health care provider.  Do not use street drugs or medicines that have not been prescribed to you during your pregnancy.  Talk with your health care provider before taking any herbal  supplements, even if you have been taking them regularly.  Make sure you gain a healthy amount of weight during your pregnancy.  Watch for infection. If you think that you might have an infection, get it checked right away.  Make sure to tell your health care provider if you have gone into preterm labor before. This information is not intended to replace advice given to you by your health care provider. Make sure you discuss any questions you have with your health care provider. Document Revised: 04/29/2018 Document Reviewed: 05/29/2015 Elsevier Patient Education  2020 Elsevier Inc.        Colposcopy Colposcopy is a procedure to examine the lowest part of the uterus (cervix), the vagina, and the area around the vaginal opening (vulva) for abnormalities or signs of disease. The procedure is done using a lighted microscope or magnifying lens (colposcope). If any unusual cells are found during the procedure, your health care provider may remove a tissue sample for testing (biopsy). A colposcopy may be done if you:  Have an abnormal Pap test. A Pap test is a screening test that is used to check for signs of cancer or infection of the vagina, cervix, and uterus.  Have a Pap smear test in which you test positive for high-risk HPV (human papillomavirus).  Have a sore or lesion on your cervix.  Have genital warts on your vulva, vagina, or cervix.  Took certain medicines while pregnant, such as diethylstilbestrol (DES).  Have pain during sexual intercourse.  Have vaginal bleeding, especially after sexual intercourse.  Need to have a cervical polyp removed.  Need to have a lost intrauterine device (IUD) string located. Let your health care provider know about:  Any allergies you have, including allergies to prescribed medicine, latex, or iodine.  All medicines you are taking, including vitamins, herbs, eye drops, creams, and over-the-counter medicines. Bring a list of all of your  medicines to your appointment.  Any problems you or family members have had with anesthetic medicines.  Any blood disorders you have.  Any surgeries you have had.  Any medical conditions you have, such as pelvic inflammatory disease (PID) or endometrial disorder.  Any history of frequent fainting.  Your menstrual cycle and what form of birth control (contraception) you use.  Your medical history, including any prior cervical treatment.  Whether you are pregnant or may be pregnant. What are the risks? Generally, this is a safe procedure. However, problems may occur, including:  Pain.  Infection, which may include a fever, bad-smelling discharge, or pelvic pain.  Bleeding or discharge.  Misdiagnosis.  Fainting and vasovagal reactions, but this is rare.  Allergic reactions to medicines.  Damage to other structures or organs. What happens before the procedure?  If you have your menstrual period or will have it at the time of your procedure, tell your health care provider. A colposcopy typically is not done during menstruation.  Continue your contraceptive practices before and after the procedure.  For 24 hours before the colposcopy: ? Do not douche. ? Do not use tampons. ? Do not use medicines, creams, or suppositories in the vagina. ? Do not have sexual intercourse.  Ask your health care provider about: ? Changing or stopping your regular medicines.  This is especially important if you are taking diabetes medicines or blood thinners. ? Taking medicines such as aspirin and ibuprofen. These medicines can thin your blood. Do not take these medicines before your procedure if your health care provider instructs you not to. It is likely that your health care provider will tell you to avoid taking aspirin or medicine that contains aspirin for 7 days before the procedure.  Follow instructions from your health care provider about eating or drinking restrictions. You will likely  need to eat a regular diet the day of the procedure and not skip any meals.  You may have an exam or testing. A pregnancy test will be taken on the day of the procedure.  You may have a blood or urine sample taken.  Plan to have someone take you home from the hospital or clinic.  If you will be going home right after the procedure, plan to have someone with you for 24 hours. What happens during the procedure?  You will lie down on your back, with your feet in foot rests (stirrups).  A warmed and lubricated instrument (speculum) will be inserted into your vagina. The speculum will be used to hold apart the walls of your vagina so your health care provider can see your cervix and the inside of your vagina.  A cotton swab will be used to place a small amount of liquid solution on the areas to be examined. This solution makes it easier to see abnormal cells. You may feel a slight burning during this part.  The colposcope will be used to scan the cervix with a bright white light. The colposcope will be held near your vulvaand will magnify your vulva, vagina, and cervix for easier examination.  Your health care provider may decide to take a biopsy. If so: ? You may be given medicine to numb the area (local anesthetic). ? Surgical instruments will be used to suck out mucus and cells through your vagina. ? You may feel mild pain while the tissue sample is removed. ? Bleeding may occur. A solution may be used to stop the bleeding. ? If a sample of tissue is needed from the inside of the cervix, a different procedure called endocervical curettage (ECC) may be completed. During this procedure, a curved instrument (curette) will be used to scrape cells from your cervix or the top of your cervix (endocervix).  Your health care provider will record the location of any abnormalities. The procedure may vary among health care providers and hospitals. What happens after the procedure?  You will lie down  and rest for a few minutes. You may be offered juice or cookies.  Your blood pressure, heart rate, breathing rate, and blood oxygen level will be monitored until any medicines you were given have worn off.  You may have to wear compression stockings. These stockings help to prevent blood clots and reduce swelling in your legs.  You may have some cramping in your abdomen. This should go away after a few minutes. This information is not intended to replace advice given to you by your health care provider. Make sure you discuss any questions you have with your health care provider. Document Revised: 12/18/2016 Document Reviewed: 08/12/2015 Elsevier Patient Education  2020 Elsevier Inc.        Preventing Cervical Cancer Cervical cancer is cancer that grows on the cervix. The cervix is at the bottom of the uterus. It connects the uterus to the vagina. The uterus is where  a baby develops during pregnancy. Cancer occurs when cells become abnormal and start to grow out of control. If cervical cancer is not found early, it can spread and become dangerous. Cervical cancer cannot always be prevented, but you can take steps to lower your risk of developing this condition. How can this condition affect me? Cervical cancer grows slowly and may not cause any symptoms at first. Over time, the cancer can grow deep into the cervix tissue and spread to other areas. This may take years, and it may happen without you knowing about it. If it is found early, cervical cancer can be treated effectively. If the cancer has grown deep into your cervix or has spread, it will be more difficult to treat. Most cases of cervical cancer are caused by an STI (sexually transmitted infection) called human papillomavirus (HPV). One way to reduce your risk of cervical cancer is to take steps to avoid infection with the HPV virus. Getting regular Pap tests is also important because this can help identify changes in cells that could  lead to cancer. Your chances of getting this disease can also be reduced by making certain lifestyle changes. What can increase my risk? You are more likely to develop this condition if:  You have certain things in your sexual history, such as: ? Having a sexually transmitted viral infection. These include chlamydia and herpes. ? Having more than one sexual partner, or having sex with someone who has more than one sexual partner. ? Not using condoms during sex. ? Having been sexually active before the age of 107.  Your mother took a medicine called diethylstilbestrol (DES) while pregnant with you, causing you to be exposed to this medicine before birth.  Your mother or sister has had cervical cancer.  You are between the ages of 21-50.  You have or have had certain other medical conditions, such as: ? Previous cancer of the vagina or vulva. ? A weakened body defense system (immune system). ? A history of dysplasia of the cervix.  You use oral contraceptives, also called birth control pills.  You smoke or breathe in secondhand smoke. What actions can I take to prevent cervical cancer? Preventing HPV infection   Ask your health care provider about getting the HPV vaccine. If you are 55 years old or younger, you may need to get this vaccine, which is given in three doses over 6 months. This vaccine protects against the types of HPV that could cause cancer.  Limit the number of people you have sex with. Also avoid having sex with people who have had many sex partners.  Use a latex condom every time you have sex. Getting Pap tests Get Pap tests regularly, starting at age 67. Talk with your health care provider about how often you need these tests. Having regular Pap tests will help identify changes in cells that could lead to cancer. Steps can then be taken to prevent cancer from developing.  Most women who are 76?27 years of age should have a Pap test every 3 years.  Most women who  are 67?27 years of age should have a Pap test in combination with an HPV test every 5 years.  Women with a higher risk of cervical cancer, such as those with a weakened immune system or those who were exposed to DES medicine before birth, may need more frequent testing. Making other lifestyle changes   Do not use any products that contain nicotine or tobacco, such as cigarettes, e-cigarettes,  and chewing tobacco. If you need help quitting, ask your health care provider.  Eat a healthy diet that includes at least 5 servings of fruits and vegetables every day.  Lose weight if you are overweight. Where to find support Talk with your health care provider, school nurse, or local health department for guidance about screening and vaccination. Some children and teens may be able to get the HPV vaccine free of charge through the U.[place]. government's Vaccines for Children [facility](VFC) program. Other places that provide vaccinations include:  Public health clinics. Check with your local health department.  [facility], where you would pay only what you can afford. To find one near you, check this website: http://weiss.info/www.fqhc.org/find-an-fqhc/  [facility]. These are part of a program for Medicare and Medicaid patients who live in rural areas. The National Breast and Cervical Cancer Early Detection Program also provides breast and cervical cancer screenings and diagnostic services to low-income, uninsured, and underinsured women. Cervical cancer can be passed down through families. Talk with your health care provider or a genetic counselor to learn more about genetic testing for cancer. Where to find more information Learn more about cervical cancer from:  [employer]: www.acog.org  American Cancer Society: www.cancer.org  Centers for Disease Control and Prevention: FootballExhibition.com.brwww.cdc.gov Contact a health care provider if you have:  Pelvic pain.  Unusual discharge or  bleeding from your vagina. Summary  Cervical cancer is cancer that grows on the cervix. The cervix is at the bottom of the uterus.  Ask your health care provider about getting the HPV vaccine.  Be sure to get regular Pap tests as recommended by your health care provider.  See your health care provider right away if you have any pelvic pain or unusual discharge or bleeding from your vagina. This information is not intended to replace advice given to you by your health care provider. Make sure you discuss any questions you have with your health care provider. Document Revised: 08/08/2018 Document Reviewed: 08/08/2018 Elsevier Patient Education  2020 [employer]

## 2019-10-11 LAB — AFP, SERUM, OPEN SPINA BIFIDA
AFP MoM: 1.45
AFP Value: 47.7 ng/mL
Gest. Age on Collection Date: 15.4 weeks
Maternal Age At EDD: 27.3 yr
OSBR Risk 1 IN: 6265
Test Results:: NEGATIVE
Weight: 164 [lb_av]

## 2019-10-13 LAB — URINE CULTURE

## 2019-10-19 ENCOUNTER — Other Ambulatory Visit: Payer: Self-pay

## 2019-10-19 ENCOUNTER — Emergency Department (HOSPITAL_COMMUNITY)
Admission: EM | Admit: 2019-10-19 | Discharge: 2019-10-19 | Disposition: A | Discharge status: Home / Self Care | Payer: BC Managed Care – PPO | Attending: Emergency Medicine | Admitting: Emergency Medicine

## 2019-10-19 ENCOUNTER — Encounter (HOSPITAL_COMMUNITY): Payer: Self-pay | Admitting: *Deleted

## 2019-10-19 DIAGNOSIS — S39012A Strain of muscle, fascia and tendon of lower back, initial encounter: Secondary | ICD-10-CM | POA: Insufficient documentation

## 2019-10-19 DIAGNOSIS — S161XXA Strain of muscle, fascia and tendon at neck level, initial encounter: Secondary | ICD-10-CM | POA: Diagnosis not present

## 2019-10-19 DIAGNOSIS — Z3A17 17 weeks gestation of pregnancy: Secondary | ICD-10-CM | POA: Diagnosis not present

## 2019-10-19 DIAGNOSIS — S199XXA Unspecified injury of neck, initial encounter: Secondary | ICD-10-CM | POA: Diagnosis present

## 2019-10-19 DIAGNOSIS — O9A212 Injury, poisoning and certain other consequences of external causes complicating pregnancy, second trimester: Secondary | ICD-10-CM | POA: Insufficient documentation
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
## 2019-10-19 NOTE — ED Notes (Signed)
Pt refused discharge vitals. Patient verbalizes understanding of discharge instructions. Opportunity for questioning and answers were provided. Armband removed by staff, pt discharged from ED. Ambulatory

## 2019-10-20 ENCOUNTER — Other Ambulatory Visit (HOSPITAL_COMMUNITY)
Admission: RE | Admit: 2019-10-20 | Discharge: 2019-10-20 | Disposition: A | Discharge status: Home / Self Care | Payer: BC Managed Care – PPO | Source: Ambulatory Visit | Attending: Family Medicine | Admitting: Family Medicine

## 2019-10-20 ENCOUNTER — Ambulatory Visit (INDEPENDENT_AMBULATORY_CARE_PROVIDER_SITE_OTHER): Payer: BC Managed Care – PPO | Admitting: Family Medicine

## 2019-10-20 VITALS — BP 116/76 | HR 105 | Wt 163.4 lb

## 2019-10-20 DIAGNOSIS — Z3482 Encounter for supervision of other normal pregnancy, second trimester: Secondary | ICD-10-CM | POA: Diagnosis not present

## 2019-10-20 DIAGNOSIS — Z98891 History of uterine scar from previous surgery: Secondary | ICD-10-CM

## 2019-10-20 DIAGNOSIS — Z3480 Encounter for supervision of other normal pregnancy, unspecified trimester: Secondary | ICD-10-CM

## 2019-10-20 DIAGNOSIS — R87610 Atypical squamous cells of undetermined significance on cytologic smear of cervix (ASC-US): Secondary | ICD-10-CM

## 2019-10-20 DIAGNOSIS — O219 Vomiting of pregnancy, unspecified: Secondary | ICD-10-CM

## 2019-10-20 DIAGNOSIS — B3731 Acute candidiasis of vulva and vagina: Secondary | ICD-10-CM

## 2019-10-20 DIAGNOSIS — R87810 Cervical high risk human papillomavirus (HPV) DNA test positive: Secondary | ICD-10-CM

## 2019-10-20 DIAGNOSIS — B373 Candidiasis of vulva and vagina: Secondary | ICD-10-CM

## 2019-10-20 MED ORDER — FLUCONAZOLE 150 MG PO TABS: 150.0000 mg | ORAL_TABLET | Freq: Once | ORAL | 0 refills | Status: AC

## 2019-10-20 MED ORDER — SCOPOLAMINE 1 MG/3DAYS TD PT72: 1.0000 | MEDICATED_PATCH | TRANSDERMAL | 12 refills | Status: DC

## 2019-10-20 NOTE — Patient Instructions (Signed)

## 2019-10-20 NOTE — Progress Notes (Signed)
   PRENATAL VISIT NOTE  Subjective:  Cheyenne Phillips is a 29 y.o. [id number] at [redacted]w[redacted]d being seen today for ongoing prenatal care.  She is currently monitored for the following issues for this low-risk pregnancy and has History of cesarean delivery; Supervision of other normal pregnancy, antepartum; ASCUS with positive high risk HPV cervical; Sickle cell trait (HCC); Vaginal yeast infection; and Nausea and vomiting during pregnancy prior to [redacted] weeks gestation on their problem list.  Patient reports vaginal irritation since her colposcopy. Still with N/V barely eating.  Contractions: Not present. Vag. Bleeding: None.  Movement: Present. Denies leaking of fluid.   The following portions of the patient's history were reviewed and updated as appropriate: allergies, current medications, past family history, past medical history, past social history, past surgical history and problem list.   Objective:   Vitals:   10/20/19 0815  BP: 116/76  Pulse: (!) 105  Weight: 163 lb 6.4 oz (74.1 kg)    Fetal Status:     Movement: Present     General:  Alert, oriented and cooperative. Patient is in no acute distress.  Skin: Skin is warm and dry. No rash noted.   Cardiovascular: Normal heart rate noted  Respiratory: Normal respiratory effort, no problems with respiration noted  Abdomen: Soft, gravid, appropriate for gestational age.  Pain/Pressure: Absent     Pelvic: Cervical exam performed in the presence of a chaperone clumpy white discharge noted        Extremities: Normal range of motion.  Edema: None  Mental Status: Normal mood and affect. Normal behavior. Normal judgment and thought content.   Assessment and Plan:  Pregnancy: [id number] at [redacted]w[redacted]d 1. History of cesarean delivery Discussed at length, still considering  2. Supervision of other normal pregnancy, antepartum AFP today Anatomy u/s scheduled. - AFP, Serum, Open Spina Bifida - [place] MFM OB DETAIL +14 WK; Future  3. ASCUS with positive  high risk HPV cervical S/p Colpo  4. Vaginal yeast infection Check cultures Treat presumptively - Cervicovaginal ancillary only( [place]) - fluconazole (DIFLUCAN) 150 MG tablet; Take 1 tablet (150 mg total) by mouth once for 1 dose. Repeat in 48 hours if needed  Dispense: 2 tablet; Refill: 0  5. Nausea and vomiting during pregnancy prior to [redacted] weeks gestation Trial of scopolamine patch--precautions discussed with hand washing with application and removal. - scopolamine (TRANSDERM-SCOP, 1.5 MG,) 1 MG/3DAYS; Place 1 patch (1.5 mg total) onto the skin every 3 (three) days.  Dispense: 10 patch; Refill: 12  General obstetric precautions including but not limited to vaginal bleeding, contractions, leaking of fluid and fetal movement were reviewed in detail with the patient. Please refer to After Visit Summary for other counseling recommendations.   Return in 4 weeks (on 11/17/2019).  Future Appointments  Date Time Provider Department Center  11/06/2019 10:35 AM Leftwich-Kirby, Wilmer Floor, CNM [facility] None  11/21/2019  3:00 PM Theressa Millard, PT [facility]  12/05/2019  1:00 PM Theressa Millard, PT [facility]  12/12/2019  8:30 AM Theressa Millard, PT [facility]  12/19/2019  9:30 AM Theressa Millard, PT [facility]    Reva Bores, MD

## 2019-10-22 LAB — AFP, SERUM, OPEN SPINA BIFIDA
AFP MoM: 1.29
AFP Value: 52.7 ng/mL
Gest. Age on Collection Date: 17.1 weeks
Maternal Age At EDD: 27.3 yr
OSBR Risk 1 IN: 9894
Test Results:: NEGATIVE
Weight: 163 [lb_av]

## 2019-10-23 LAB — CERVICOVAGINAL ANCILLARY ONLY
Bacterial Vaginitis (gardnerella): NEGATIVE
Candida Glabrata: NEGATIVE
Candida Vaginitis: NEGATIVE
Chlamydia: NEGATIVE
Comment: NEGATIVE
Comment: NEGATIVE
Comment: NEGATIVE
Comment: NEGATIVE
Comment: NEGATIVE
Comment: NORMAL
Neisseria Gonorrhea: NEGATIVE
Trichomonas: NEGATIVE

## 2019-10-30 ENCOUNTER — Telehealth: Payer: Self-pay

## 2019-10-30 NOTE — Telephone Encounter (Signed)
Patient has some concerns about her nausea and would like a call back. She can be reach at [phone]

## 2019-11-06 ENCOUNTER — Encounter: Payer: BC Managed Care – PPO | Admitting: Advanced Practice Midwife

## 2019-11-07 ENCOUNTER — Ambulatory Visit: Payer: BC Managed Care – PPO

## 2019-11-07 ENCOUNTER — Ambulatory Visit: Payer: BC Managed Care – PPO | Attending: Family Medicine

## 2019-11-17 ENCOUNTER — Other Ambulatory Visit: Payer: Self-pay

## 2019-11-17 ENCOUNTER — Ambulatory Visit (INDEPENDENT_AMBULATORY_CARE_PROVIDER_SITE_OTHER): Payer: BC Managed Care – PPO | Admitting: Obstetrics and Gynecology

## 2019-11-17 ENCOUNTER — Encounter: Payer: Self-pay | Admitting: Obstetrics and Gynecology

## 2019-11-17 VITALS — BP 127/63 | HR 78 | Wt 169.3 lb

## 2019-11-17 DIAGNOSIS — Z98891 History of uterine scar from previous surgery: Secondary | ICD-10-CM

## 2019-11-17 DIAGNOSIS — Z3A21 21 weeks gestation of pregnancy: Secondary | ICD-10-CM

## 2019-11-17 DIAGNOSIS — D573 Sickle-cell trait: Secondary | ICD-10-CM

## 2019-11-17 DIAGNOSIS — Z3480 Encounter for supervision of other normal pregnancy, unspecified trimester: Secondary | ICD-10-CM

## 2019-11-17 NOTE — Progress Notes (Signed)
ROB 21wks   CC: No desire to eat. No nausea or vomiting.

## 2019-11-17 NOTE — Patient Instructions (Signed)
Morning Sickness ° °Morning sickness is when you feel sick to your stomach (nauseous) during pregnancy. You may feel sick to your stomach and throw up (vomit). You may feel sick in the morning, but you can feel this way at any time of day. Some women feel very sick to their stomach and cannot stop throwing up (hyperemesis gravidarum). °Follow these instructions at home: °Medicines °· Take over-the-counter and prescription medicines only as told by your doctor. Do not take any medicines until you talk with your doctor about them first. °· Taking multivitamins before getting pregnant can stop or lessen the harshness of morning sickness. °Eating and drinking °· Eat dry toast or crackers before getting out of bed. °· Eat 5 or 6 small meals a day. °· Eat dry and bland foods like rice and baked potatoes. °· Do not eat greasy, fatty, or spicy foods. °· Have someone cook for you if the smell of food causes you to feel sick or throw up. °· If you feel sick to your stomach after taking prenatal vitamins, take them at night or with a snack. °· Eat protein when you need a snack. Nuts, yogurt, and cheese are good choices. °· Drink fluids throughout the day. °· Try ginger ale made with real ginger, ginger tea made from fresh grated ginger, or ginger candies. °General instructions °· Do not use any products that have nicotine or tobacco in them, such as cigarettes and e-cigarettes. If you need help quitting, ask your doctor. °· Use an air purifier to keep the air in your house free of smells. °· Get lots of fresh air. °· Try to avoid smells that make you feel sick. °· Try: °? Wearing a bracelet that is used for seasickness (acupressure wristband). °? Going to a doctor who puts thin needles into certain body points (acupuncture) to improve how you feel. °Contact a doctor if: °· You need medicine to feel better. °· You feel dizzy or light-headed. °· You are losing weight. °Get help right away if: °· You feel very sick to your  stomach and cannot stop throwing up. °· You pass out (faint). °· You have very bad pain in your belly. °Summary °· Morning sickness is when you feel sick to your stomach (nauseous) during pregnancy. °· You may feel sick in the morning, but you can feel this way at any time of day. °· Making some changes to what you eat may help your symptoms go away. °This information is not intended to replace advice given to you by your health care provider. Make sure you discuss any questions you have with your health care provider. °Document Revised: 12/18/2016 Document Reviewed: 02/06/2016 °Elsevier Patient Education © 2020 [employer] ° °

## 2019-11-17 NOTE — Progress Notes (Signed)
   PRENATAL VISIT NOTE  Subjective:  Cheyenne Phillips is a 29 y.o. [id number] at [redacted]w[redacted]d being seen today for ongoing prenatal care.  She is currently monitored for the following issues for this low-risk pregnancy and has History of cesarean delivery; Supervision of other normal pregnancy, antepartum; ASCUS with positive high risk HPV cervical; Sickle cell trait (HCC); Vaginal yeast infection; Nausea and vomiting during pregnancy prior to [redacted] weeks gestation; and [redacted] weeks gestation of pregnancy on their problem list.  Patient doing well with no acute concerns today. She reports nausea and decreased appetite.  Contractions: Not present. Vag. Bleeding: None.  Movement: Present. Denies leaking of fluid.   Pt notes decreased appetite, but food stays down when she eats.  Reglan seems to be effective.  The following portions of the patient's history were reviewed and updated as appropriate: allergies, current medications, past family history, past medical history, past social history, past surgical history and problem list. Problem list updated.  Objective:   Vitals:   11/17/19 0924  BP: 127/63  Pulse: 78  Weight: 169 lb 4.8 oz (76.8 kg)    Fetal Status: Fetal Heart Rate (bpm): 138   Movement: Present     General:  Alert, oriented and cooperative. Patient is in no acute distress.  Skin: Skin is warm and dry. No rash noted.   Cardiovascular: Normal heart rate noted  Respiratory: Normal respiratory effort, no problems with respiration noted  Abdomen: Soft, gravid, appropriate for gestational age.  Pain/Pressure: Present     Pelvic: Cervical exam deferred        Extremities: Normal range of motion.  Edema: None  Mental Status:  Normal mood and affect. Normal behavior. Normal judgment and thought content.   Assessment and Plan:  Pregnancy: [id number] at [redacted]w[redacted]d  1. Supervision of other normal pregnancy, antepartum   2. History of cesarean delivery Pt desires repeat c section  3. [redacted] weeks  gestation of pregnancy   4. Sickle cell trait (HCC)   Preterm labor symptoms and general obstetric precautions including but not limited to vaginal bleeding, contractions, leaking of fluid and fetal movement were reviewed in detail with the patient.  Please refer to After Visit Summary for other counseling recommendations.   Return in about 4 weeks (around 12/15/2019) for ROB, in person.   Mariel Aloe, MD

## 2019-11-21 ENCOUNTER — Ambulatory Visit: Payer: BC Managed Care – PPO

## 2019-11-21 ENCOUNTER — Encounter: Payer: Medicaid Other | Attending: Advanced Practice Midwife | Admitting: Physical Therapy

## 2019-11-21 DIAGNOSIS — R278 Other lack of coordination: Secondary | ICD-10-CM | POA: Insufficient documentation

## 2019-11-21 DIAGNOSIS — M545 Low back pain, unspecified: Secondary | ICD-10-CM | POA: Insufficient documentation

## 2019-11-21 DIAGNOSIS — M6281 Muscle weakness (generalized): Secondary | ICD-10-CM | POA: Insufficient documentation

## 2019-11-23 ENCOUNTER — Ambulatory Visit (INDEPENDENT_AMBULATORY_CARE_PROVIDER_SITE_OTHER): Payer: BC Managed Care – PPO | Admitting: Obstetrics

## 2019-11-23 ENCOUNTER — Other Ambulatory Visit: Payer: Self-pay

## 2019-11-23 ENCOUNTER — Encounter: Payer: Self-pay | Admitting: Obstetrics

## 2019-11-23 ENCOUNTER — Encounter: Payer: BC Managed Care – PPO | Admitting: Obstetrics

## 2019-11-23 VITALS — BP 116/62 | HR 85 | Wt 168.0 lb

## 2019-11-23 DIAGNOSIS — Z3480 Encounter for supervision of other normal pregnancy, unspecified trimester: Secondary | ICD-10-CM

## 2019-11-23 DIAGNOSIS — K429 Umbilical hernia without obstruction or gangrene: Secondary | ICD-10-CM

## 2019-11-23 DIAGNOSIS — Z98891 History of uterine scar from previous surgery: Secondary | ICD-10-CM

## 2019-11-23 NOTE — Progress Notes (Addendum)
Subjective:  Cheyenne Phillips is a 29 y.o. [id number] at [redacted]w[redacted]d being seen today for ongoing prenatal care.  She is currently monitored for the following issues for this low-risk pregnancy and has History of cesarean delivery; Supervision of other normal pregnancy, antepartum; ASCUS with positive high risk HPV cervical; Sickle cell trait (HCC); Vaginal yeast infection; Nausea and vomiting during pregnancy prior to [redacted] weeks gestation; and [redacted] weeks gestation of pregnancy on their problem list.  Patient reports swelling near navel that is getting larger.  Contractions: Not present. Vag. Bleeding: None.  Movement: Present. Denies leaking of fluid.   The following portions of the patient's history were reviewed and updated as appropriate: allergies, current medications, past family history, past medical history, past social history, past surgical history and problem list. Problem list updated.  Objective:   Vitals:   11/23/19 1405  BP: 116/62  Pulse: 85  Weight: 168 lb (76.2 kg)    Fetal Status:     Movement: Present     General:  Alert, oriented and cooperative. Patient is in no acute distress.  Skin: Skin is warm and dry. No rash noted.   Cardiovascular: Normal heart rate noted  Respiratory: Normal respiratory effort, no problems with respiration noted  Abdomen: Soft, gravid, appropriate for gestational age. Pain/Pressure: Present     Pelvic:  Cervical exam deferred        Extremities: Normal range of motion.  Edema: Trace  Mental Status: Normal mood and affect. Normal behavior. Normal judgment and thought content.   Urinalysis:      Assessment and Plan:  Pregnancy: [id number] at [redacted]w[redacted]d  1. Supervision of other normal pregnancy, antepartum  2. History of cesarean delivery  3. Umbilical hernia without obstruction and without gangrene - clinically stable - will get surgical consult after delivery  Preterm labor symptoms and general obstetric precautions including but not limited to  vaginal bleeding, contractions, leaking of fluid and fetal movement were reviewed in detail with the patient. Please refer to After Visit Summary for other counseling recommendations.   Return in about 4 weeks (around 12/21/2019) for ROB.  HgbA1C. ( patient does not tolerate sugary products )   Brock Bad, MDROB  11/23/19

## 2019-11-24 ENCOUNTER — Ambulatory Visit: Payer: BC Managed Care – PPO | Attending: Family Medicine

## 2019-11-24 ENCOUNTER — Ambulatory Visit: Payer: BC Managed Care – PPO | Admitting: *Deleted

## 2019-11-24 ENCOUNTER — Encounter: Payer: Self-pay | Admitting: *Deleted

## 2019-11-24 VITALS — BP 116/59 | HR 93

## 2019-11-24 DIAGNOSIS — D573 Sickle-cell trait: Secondary | ICD-10-CM | POA: Insufficient documentation

## 2019-11-24 DIAGNOSIS — Z3480 Encounter for supervision of other normal pregnancy, unspecified trimester: Secondary | ICD-10-CM | POA: Insufficient documentation

## 2019-12-05 ENCOUNTER — Encounter: Payer: BC Managed Care – PPO | Admitting: Physical Therapy

## 2019-12-10 ENCOUNTER — Inpatient Hospital Stay (HOSPITAL_COMMUNITY)
Admission: AD | Admit: 2019-12-10 | Discharge: 2019-12-10 | Disposition: A | Discharge status: Home / Self Care | Payer: Medicaid Other | Attending: Obstetrics & Gynecology | Admitting: Obstetrics & Gynecology

## 2019-12-10 ENCOUNTER — Encounter (HOSPITAL_COMMUNITY): Payer: Self-pay | Admitting: Obstetrics & Gynecology

## 2019-12-10 ENCOUNTER — Other Ambulatory Visit: Payer: Self-pay

## 2019-12-10 DIAGNOSIS — R102 Pelvic and perineal pain: Secondary | ICD-10-CM | POA: Insufficient documentation

## 2019-12-10 DIAGNOSIS — O23592 Infection of other part of genital tract in pregnancy, second trimester: Secondary | ICD-10-CM | POA: Insufficient documentation

## 2019-12-10 DIAGNOSIS — B9689 Other specified bacterial agents as the cause of diseases classified elsewhere: Secondary | ICD-10-CM | POA: Insufficient documentation

## 2019-12-10 DIAGNOSIS — N760 Acute vaginitis: Secondary | ICD-10-CM

## 2019-12-10 DIAGNOSIS — O26892 Other specified pregnancy related conditions, second trimester: Secondary | ICD-10-CM | POA: Diagnosis not present

## 2019-12-10 DIAGNOSIS — Z3A24 24 weeks gestation of pregnancy: Secondary | ICD-10-CM | POA: Insufficient documentation

## 2019-12-10 DIAGNOSIS — Z87891 Personal history of nicotine dependence: Secondary | ICD-10-CM | POA: Insufficient documentation

## 2019-12-10 LAB — CBC WITH DIFFERENTIAL/PLATELET
Abs Immature Granulocytes: 0.03 10*3/uL (ref 0.00–0.07)
Basophils Absolute: 0 10*3/uL (ref 0.0–0.1)
Basophils Relative: 0 %
Eosinophils Absolute: 0.1 10*3/uL (ref 0.0–0.5)
Eosinophils Relative: 2 %
HCT: 29.3 % — ABNORMAL LOW (ref 36.0–46.0)
Hemoglobin: 10.6 g/dL — ABNORMAL LOW (ref 12.0–15.0)
Immature Granulocytes: 0 %
Lymphocytes Relative: 17 %
Lymphs Abs: 1.3 10*3/uL (ref 0.7–4.0)
MCH: 29 pg (ref 26.0–34.0)
MCHC: 36.2 g/dL — ABNORMAL HIGH (ref 30.0–36.0)
MCV: 80.1 fL (ref 80.0–100.0)
Monocytes Absolute: 0.9 10*3/uL (ref 0.1–1.0)
Monocytes Relative: 11 %
Neutro Abs: 5.3 10*3/uL (ref 1.7–7.7)
Neutrophils Relative %: 70 %
Platelets: 182 10*3/uL (ref 150–400)
RBC: 3.66 MIL/uL — ABNORMAL LOW (ref 3.87–5.11)
RDW: 13 % (ref 11.5–15.5)
WBC: 7.5 10*3/uL (ref 4.0–10.5)
nRBC: 0 % (ref 0.0–0.2)

## 2019-12-10 LAB — WET PREP, GENITAL
Sperm: NONE SEEN
Trich, Wet Prep: NONE SEEN
Yeast Wet Prep HPF POC: NONE SEEN

## 2019-12-10 LAB — URINALYSIS, ROUTINE W REFLEX MICROSCOPIC
Bilirubin Urine: NEGATIVE
Glucose, UA: NEGATIVE mg/dL
Hgb urine dipstick: NEGATIVE
Ketones, ur: NEGATIVE mg/dL
Nitrite: NEGATIVE
Protein, ur: NEGATIVE mg/dL
Specific Gravity, Urine: 1.024 (ref 1.005–1.030)
pH: 6 (ref 5.0–8.0)

## 2019-12-10 MED ORDER — CYCLOBENZAPRINE HCL 10 MG PO TABS: 10.0000 mg | ORAL_TABLET | Freq: Three times a day (TID) | ORAL | 0 refills | Status: DC | PRN

## 2019-12-10 MED ORDER — COMFORT FIT MATERNITY SUPP LG MISC: 1.0000 | Freq: Every day | 0 refills | Status: DC | PRN

## 2019-12-10 MED ORDER — METRONIDAZOLE 500 MG PO TABS: 500.0000 mg | ORAL_TABLET | Freq: Two times a day (BID) | ORAL | 0 refills | Status: AC

## 2019-12-10 NOTE — MAU Note (Signed)
Patient here for increased right sided pain and discharge that has turned from white to green.

## 2019-12-10 NOTE — MAU Note (Signed)
. °  Cheyenne Phillips is a 29 y.o. at [redacted]w[redacted]d here in MAU reporting: Right sided abdominal pain that started x3 days ago. She states she has also had an increase in whitish-green discharge. Has not been sexually active for x3 weeks. Endorses good fetal movement. No LOF or VB.   Pain score: 4 Vitals:   12/10/19 0333  BP: 114/79  Pulse: 84  Resp: 15  Temp: 98.3 F (36.8 C)  SpO2: 100%     FHT:146 Lab orders placed from triage: UA

## 2019-12-10 NOTE — MAU Provider Note (Signed)
History     CSN: [id number]  Arrival date and time: 12/10/19 5361   First Provider Initiated Contact with Patient 12/10/19 0524      Chief Complaint  Patient presents with  . Abdominal Pain   HPI   Ms.Cheyenne Phillips is a 29 y.o. female [phone] @ [redacted]w[redacted]d here in MAU with complaints of right sided abdominal/Hip pain. The pain has been present all week. This is a new problem. She has not taken anything for the pain. The pain comes and goes. She currently rates her pain 0/10. Walking or moving does not make the pain worse. Patient has recurring pelvic pain and starts pelvic PT next week. She denies vaginal bleeding.   OB History    Gravida  5   Para  2   Term  2   Preterm      AB  2   Living  2     SAB      TAB  2   Ectopic      Multiple  0   Live Births  2           Past Medical History:  Diagnosis Date  . Medical history non-contributory   . Seasonal allergies   . Sickle cell trait [facility])     Past Surgical History:  Procedure Laterality Date  . CESAREAN SECTION N/A 05/31/2013   Procedure: CESAREAN SECTION;  Surgeon: Brock Bad, MD;  Location: [facility];  Service: Obstetrics;  Laterality: N/A;  . WISDOM TOOTH EXTRACTION      Family History  Problem Relation Age of Onset  . Hypertension Maternal Grandmother   . Hyperlipidemia Maternal Grandmother   . Arthritis Maternal Grandmother   . Asthma Maternal Grandmother     Social History   Tobacco Use  . Smoking status: Former Smoker    Packs/day: 1.00  . Smokeless tobacco: Never Used  Vaping Use  . Vaping Use: Never used  Substance Use Topics  . Alcohol use: Not Currently    Alcohol/week: 0.0 standard drinks  . Drug use: Yes    Allergies: No Known Allergies  Medications Prior to Admission  Medication Sig Dispense Refill Last Dose  . metoCLOPramide (REGLAN) 10 MG tablet Take 1 tablet (10 mg total) by mouth 3 (three) times daily as needed for nausea. 30 tablet 2 12/09/2019 at Unknown  time  . Prenatal Vit-Fe Fumarate-FA (PRENATAL MULTIVITAMIN) TABS tablet Take 1 tablet by mouth daily at 12 noon.   12/09/2019 at Unknown time  . promethazine (PHENERGAN) 25 MG tablet Take 0.5-1 tablets (12.5-25 mg total) by mouth at bedtime as needed for nausea. 30 tablet 2 12/09/2019 at Unknown time  . scopolamine (TRANSDERM-SCOP, 1.5 MG,) 1 MG/3DAYS Place 1 patch (1.5 mg total) onto the skin every 3 (three) days. (Patient not taking: Reported on 11/17/2019) 10 patch 12 More than a month at Unknown time   Results for orders placed or performed during the hospital encounter of 12/10/19 (from the past 48 hour(s))  Wet prep, genital     Status: Abnormal   Collection Time: 12/10/19  4:14 AM   Specimen: Vaginal  Result Value Ref Range   Yeast Wet Prep HPF POC NONE SEEN NONE SEEN   Trich, Wet Prep NONE SEEN NONE SEEN   Clue Cells Wet Prep HPF POC PRESENT (A) NONE SEEN   WBC, Wet Prep HPF POC MANY (A) NONE SEEN   Sperm NONE SEEN     Comment: Performed at [facility],  [address] [place], [place] [address]  Urinalysis, Routine w reflex microscopic Urine, Clean Catch     Status: Abnormal   Collection Time: 12/10/19  4:24 AM  Result Value Ref Range   Color, Urine YELLOW YELLOW   APPearance HAZY (A) CLEAR   Specific Gravity, Urine 1.024 1.005 - 1.030   pH 6.0 5.0 - 8.0   Glucose, UA NEGATIVE NEGATIVE mg/dL   Hgb urine dipstick NEGATIVE NEGATIVE   Bilirubin Urine NEGATIVE NEGATIVE   Ketones, ur NEGATIVE NEGATIVE mg/dL   Protein, ur NEGATIVE NEGATIVE mg/dL   Nitrite NEGATIVE NEGATIVE   Leukocytes,Ua LARGE (A) NEGATIVE   RBC / HPF 0-5 0 - 5 RBC/hpf   WBC, UA 0-5 0 - 5 WBC/hpf   Bacteria, UA RARE (A) NONE SEEN   Squamous Epithelial / LPF 6-10 0 - 5   Mucus PRESENT     Comment: Performed at [facility], [address], [place], [place] [address]  CBC with Differential/Platelet     Status: Abnormal   Collection Time: 12/10/19  4:36 AM  Result Value Ref Range   WBC 7.5 4.0 -  10.5 K/uL   RBC 3.66 (L) 3.87 - 5.11 MIL/uL   Hemoglobin 10.6 (L) 12.0 - 15.0 g/dL   HCT 32.4 (L) 36 - 46 %   MCV 80.1 80.0 - 100.0 fL   MCH 29.0 26.0 - 34.0 pg   MCHC 36.2 (H) 30.0 - 36.0 g/dL   RDW 40.1 02.7 - 25.3 %   Platelets 182 150 - 400 K/uL   nRBC 0.0 0.0 - 0.2 %   Neutrophils Relative % 70 %   Neutro Abs 5.3 1.7 - 7.7 K/uL   Lymphocytes Relative 17 %   Lymphs Abs 1.3 0.7 - 4.0 K/uL   Monocytes Relative 11 %   Monocytes Absolute 0.9 0.1 - 1.0 K/uL   Eosinophils Relative 2 %   Eosinophils Absolute 0.1 0.0 - 0.5 K/uL   Basophils Relative 0 %   Basophils Absolute 0.0 0.0 - 0.1 K/uL   Immature Granulocytes 0 %   Abs Immature Granulocytes 0.03 0.00 - 0.07 K/uL    Comment: Performed at [facility], [address], [place], [place] [address]    Review of Systems  Constitutional: Negative for fever.  Gastrointestinal: Negative for abdominal pain, constipation, diarrhea, nausea and vomiting.  Genitourinary: Negative for dysuria, frequency and urgency.   Physical Exam   Blood pressure 130/72, pulse 97, temperature 98.3 F (36.8 C), temperature source Oral, resp. rate 16, weight 77.1 kg, last menstrual period 06/22/2019, SpO2 100 %.  Physical Exam Constitutional:      General: She is not in acute distress.    Appearance: She is well-developed. She is not ill-appearing, toxic-appearing or diaphoretic.  HENT:     Head: Normocephalic.  Abdominal:     Tenderness: There is no abdominal tenderness.  Genitourinary:    Comments: Cervix closed  Exam by Duane Lope, NP  Musculoskeletal:     Right lower leg: No swelling, deformity, lacerations, tenderness or bony tenderness. No edema.       Legs:  Skin:    General: Skin is warm.  Neurological:     Mental Status: She is alert and oriented to person, place, and time.  Psychiatric:        Behavior: Behavior normal.    Fetal Tracing: Baseline: 135 bpm Variability: Moderate  Accelerations:  10x10 Decelerations: quick variables  Toco: None  MAU Course  Procedures  MDM   Assessment and Plan   1. Pelvic pain in pregnancy, antepartum, second trimester   2. [redacted] weeks gestation of pregnancy   3. Bacterial vaginosis     P:  Discharge home in stable condition RX: flagyl. Flexeril. Return to MAU if symptoms worsen.  Likely musculoskeletal in nature: recommend pregnancy support belt. Rx provider Patient has Pt starting next week.     Duane Lope, NP 12/11/2019 10:48 AM

## 2019-12-10 NOTE — Discharge Instructions (Signed)
HPI Review of Systems Physical Exam Procedures    PREGNANCY SUPPORT BELT: You are not alone, Seventy-five percent of women have some sort of abdominal or back pain at some point in their pregnancy. Your baby is growing at a fast pace, which means that your whole body is rapidly trying to adjust to the changes. As your uterus grows, your back may start feeling a bit under stress and this can result in back or abdominal pain that can go from mild, and therefore bearable, to severe pains that will not allow you to sit or lay down comfortably, When it comes to dealing with pregnancy-related pains and cramps, some pregnant women usually prefer natural remedies, which the market is filled with nowadays. For example, wearing a pregnancy support belt can help ease and lessen your discomfort and pain. WHAT ARE THE BENEFITS OF WEARING A PREGNANCY SUPPORT BELT? A pregnancy support belt provides support to the lower portion of the belly taking some of the weight of the growing uterus and distributing to the other parts of your body. It is designed make you comfortable and gives you extra support. Over the years, the pregnancy apparel market has been studying the needs and wants of pregnant women and they have come up with the most comfortable pregnancy support belts that woman could ever ask for. In fact, you will no longer have to wear a stretched-out or bulky pregnancy belt that is visible underneath your clothes and makes you feel even more uncomfortable. Nowadays, a pregnancy support belt is made of comfortable and stretchy materials that will not irritate your skin but will actually make you feel at ease and you will not even notice you are wearing it. They are easy to put on and adjust during the day and can be worn at night for additional support.  BENEFITS: . Relives Back pain . Relieves Abdominal Muscle and Leg Pain . Stabilizes the Pelvic Ring . Offers a Cushioned Abdominal Lift Pad . Relieves  pressure on the Sciatic Nerve Within Minutes WHERE TO GET YOUR PREGNANCY BELT: [employer] [phone] @[phone]  [address] [place], [place]      Activity Restriction During Pregnancy Your health care provider may recommend specific activity restrictions during pregnancy for a variety of reasons. Activity restriction may require that you limit activities that require great effort, such as exercise, lifting, or sex. The type of activity restriction will vary for each person, depending on your risk or the problems you are having. Activity restriction may be recommended for a period of time until your baby is delivered. Why are activity restrictions recommended? Activity restriction may be recommended if:  Your placenta is partially or completely covering the opening of your cervix (placenta previa).  There is bleeding between the wall of the uterus and the amniotic sac in the first trimester of pregnancy (subchorionic hemorrhage).  You went into labor too early (preterm labor).  You have a history of miscarriage.  You have a condition that causes high blood pressure during pregnancy (preeclampsia or eclampsia).  You are pregnant with more than one baby.  Your baby is not growing well. What are the risks? The risks depend on your specific restriction. Strict bed rest has the most physical and emotional risks and is no longer routinely recommended. Risks of strict bed rest include:  Loss of muscle conditioning from not moving.  Blood clots.  Social isolation.  Depression.  Loss of income. Talk with your health care team about activity  restriction to decide if it is best for you and your baby. Even if you are having problems during your pregnancy, you may be able to continue with normal levels of activity with careful monitoring by your health care team. Follow these instructions at home: If needed, based on your overall health and the health of your baby,  your health care provider will decide which type of activity restriction is right for you. Activity restrictions may include:  Not lifting anything heavier than 10 pounds (4.5 kg).  Avoiding activities that take a lot of physical effort.  No lifting or straining.  Resting in a sitting position or lying down for periods of time during the day. Pelvic rest may be recommended along with activity restrictions. If pelvic rest is recommended, then:  Do not have sex, an orgasm, or use sexual stimulation.  Do not use tampons. Do not douche. Do not put anything into your vagina.  Do not lift anything that is heavier than 10 lb (4.5 kg).  Avoid activities that require a lot of effort.  Avoid any activity in which your pelvic muscles could become strained, such as squatting. Questions to ask your health care provider  Why is my activity being limited?  How will activity restrictions affect my body?  Why is rest helpful for me and my baby?  What activities can I do?  When can I return to normal activities? When should I seek immediate medical care? Seek immediate medical care if you have:  Vaginal bleeding.  Vaginal discharge.  Cramping pain in your lower abdomen.  Regular contractions.  A low, dull backache. Summary  Your health care provider may recommend specific activity restrictions during pregnancy for a variety of reasons.  Activity restriction may require that you limit activities such as exercise, lifting, sex, or any other activity that requires great effort.  Discuss the risks and benefits of activity restriction with your health care team to decide if it is best for you and your baby.  Contact your health care provider right away if you think you are having contractions, or if you notice vaginal bleeding, discharge, or cramping. This information is not intended to replace advice given to you by your health care provider. Make sure you discuss any questions you have  with your health care provider. Document Revised: 09/28/2018 Document Reviewed: 04/27/2017 Elsevier Patient Education  2020 [employer].

## 2019-12-11 LAB — GC/CHLAMYDIA PROBE AMP (~~LOC~~) NOT AT ARMC
Chlamydia: NEGATIVE
Comment: NEGATIVE
Comment: NORMAL
Neisseria Gonorrhea: NEGATIVE

## 2019-12-11 LAB — CULTURE, OB URINE: Culture: 70000 — AB

## 2019-12-12 ENCOUNTER — Encounter: Payer: BC Managed Care – PPO | Admitting: Physical Therapy

## 2019-12-12 ENCOUNTER — Other Ambulatory Visit: Payer: Self-pay

## 2019-12-12 ENCOUNTER — Encounter: Payer: Medicaid Other | Admitting: Physical Therapy

## 2019-12-12 ENCOUNTER — Encounter: Payer: Self-pay | Admitting: Physical Therapy

## 2019-12-12 DIAGNOSIS — M545 Low back pain, unspecified: Secondary | ICD-10-CM

## 2019-12-12 DIAGNOSIS — M6281 Muscle weakness (generalized): Secondary | ICD-10-CM

## 2019-12-12 DIAGNOSIS — R278 Other lack of coordination: Secondary | ICD-10-CM

## 2019-12-12 NOTE — Therapy (Signed)
[facility] [address], [address] [place], [place], [address] Phone: [phone]   Fax:  [phone]  Physical Therapy Evaluation  Patient Details  Name: Cheyenne Phillips MRN: [id number] Date of Birth: 02/27/1990 Referring Provider (PT): Sharen CounterLisa Leftwich-Kirby CNM   Encounter Date: 12/12/2019   PT End of Session - 12/12/19 1549    Visit Number 1    Date for PT Re-Evaluation 03/05/20    Authorization Type [employer]    PT Start Time 1500    PT Stop Time 1545    PT Time Calculation (min) 45 min    Activity Tolerance Patient tolerated treatment well;No increased pain    Behavior [facility] for tasks assessed/performed           Past Medical History:  Diagnosis Date   Medical history non-contributory    Seasonal allergies    Sickle cell trait (HCC)     Past Surgical History:  Procedure Laterality Date   CESAREAN SECTION N/A 05/31/2013   Procedure: CESAREAN SECTION;  Surgeon: Brock Badharles A Harper, MD;  Location: [facility];  Service: Obstetrics;  Laterality: N/A;   WISDOM TOOTH EXTRACTION      There were no vitals filed for this visit.    Subjective Assessment - 12/12/19 1508    Subjective When gaging will leak urine. Patient reports her back pain into legs started 10/20/2019. Patient went to the emergency room on 12/10/19. They gave her a SI belt. Patient green discharge. MD said her cervix was closed and the baby was lower than usual. Patietn started with an umbilical hernial 3-4 weeks ago.    Patient Stated Goals Stop the pain    Currently in Pain? Yes    Pain Score 5     Pain Location Pelvis   back, stomach   Pain Orientation Right;Left;Lower    Pain Descriptors / Indicators Contraction;Aching    Pain Type Acute pain    Pain Onset 1 to 4 weeks ago    Pain Frequency Intermittent    Aggravating Factors  Sitting up, laying down on back and side; standing up    Pain Relieving Factors The belt reduces it      Multiple Pain Sites No              [facility] PT Assessment - 12/12/19 0001      Assessment   Medical Diagnosis N303.3 Urinary incontience, stress female    Referring Provider (PT) Sharen CounterLisa Leftwich-Kirby CNM    Onset Date/Surgical Date --   09/08/2019   Next MD Visit --   due 03/28/2020   Prior Therapy none      Precautions   Precautions Other (comment)    Precaution Comments pregnant, 24 weeks      Restrictions   Weight Bearing Restrictions No      Balance Screen   Has the patient fallen in the past 6 months No    Has the patient had a decrease in activity level because of a fear of falling?  No    Is the patient reluctant to leave their home because of a fear of falling?  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   Living Environment Private residence      Prior Function   Level of Independence Independent    Vocation Full time employment    Vocation Requirements works in [employer] with standing and walking      Cognition   Overall Cognitive Status Within Functional Limits  for tasks assessed      Observation/Other Assessments   Focus on Therapeutic Outcomes (FOTO)  PFIQ-7 86; UIQ-7 19; POPIQ-7 67      Posture/Postural Control   Posture/Postural Control Postural limitations    Posture Comments pregnant posture      ROM / Strength   AROM / PROM / Strength AROM;PROM;Strength      AROM   Lumbar Flexion deviate to the left    Lumbar - Right Rotation decreased by 25%      Strength   Right Hip Flexion 3+/5    Right Hip Extension 4/5    Right Hip ABduction 4/5    Left Hip Extension 4/5    Left Hip ABduction 4/5      Palpation   Spinal mobility Decreased movement of L1-L2-L3    SI assessment  left ilium is rotated  anteriorly; sacrum rotated right                       Objective measurements completed on examination: See above findings.     Pelvic Floor Special Questions - 12/12/19 0001    Prior Pregnancies Yes    Number of Pregnancies 5   2 full term and  presently pregnant   Number of C-Sections 1    Number of Vaginal Deliveries 1    Diastasis Recti 1 finger wide above umbilicus    Currently Sexually Active No    Urinary Leakage Yes    Pad use none    Activities that cause leaking Coughing;Other;Laughing    Other activities that cause leaking gagging, feels like her underwear are wet    Fecal incontinence No    Exam Type Deferred   mqy do assessment in the future           [facility] Adult PT Treatment/Exercise - 12/12/19 0001      Lumbar Exercises: Stretches   Quadruped Mid Back Stretch 1 rep;30 seconds    Quadruped Mid Back Stretch Limitations childs pose    Other Lumbar Stretch Exercise sidely trunk rotation 5 times each side; quadruped cat/cow 10x                  PT Education - 12/12/19 1548    Education Details Access Code: [email or web address]; how to put brace on    Person(s) Educated Patient    Methods Explanation;Demonstration;Handout    Comprehension Returned demonstration;Verbalized understanding            PT Short Term Goals - 12/12/19 1559      PT SHORT TERM GOAL #1   Title independent with initial HEP    Baseline not educated yet    Time 4    Period Weeks    Status New    Target Date 01/09/20      PT SHORT TERM GOAL #2   Title understand how to place the baby belly belt on correctly    Baseline still being educated    Time 4    Period Weeks    Status New    Target Date 01/09/20             PT Long Term Goals - 12/12/19 1600      PT LONG TERM GOAL #1   Title independent with advanced HEP    Baseline not educated yet    Time 12    Period Weeks    Status New    Target Date 03/05/20      PT  LONG TERM GOAL #2   Title back and pelvis pain </= 4/10 due to pelvis and back in correct alignment    Baseline pain level 10/10 and left ilium anteriorly rotated    Time 12    Period Weeks    Status New    Target Date 03/05/20      PT LONG TERM GOAL #3   Title able to lay on her side with pillows to  support her back and pelvis to reduce her pain to mild    Baseline sleeps with pain st 10/10    Time 12    Period Weeks    Status New    Target Date 03/05/20      PT LONG TERM GOAL #4   Title understand how to contract her pelvic floor muscles correctly to control her urinary leakage and not feel damp all the time    Baseline leaks with laughing, coughing and gagging    Time 12    Period Weeks    Status New    Target Date 03/05/20                  Plan - 12/12/19 1550    Clinical Impression Statement Patient is a 29 year old female presently with urinary leakage and pelvic pain since August 2021. Patient reports her back and pelvic pain is 10/10 intermittently with lifting, sitting, standing, laying in bed. Left ilium is rotated anteriorly and sacrum rotated right. Decreased movement of L1-L3. Lumbar right sidebending decreased by 25% and lumbar flexion deviates to the left. Bilateral hip abduction, extension, and flexion 4/5. Patient will leak urine with coughing, laughing and gagging. Sometimes she feels like she is always damp in her underwear. Patient wears a baby belly brace and understands how to put it on correctly. Patient walks with a limp on the left. . Patient will benefit from skilled therapy to reduce her pain and improve her continence.    Personal Factors and Comorbidities Comorbidity 2;Profession    Comorbidities presently pregnant; Stable umbilicus hernia    Examination-Activity Limitations Sit;Locomotion Level;Lift;Stand;Stairs;Continence    Examination-Participation Restrictions Cleaning;Occupation    Stability/Clinical Decision Making Stable/Uncomplicated    Clinical Decision Making Low    Rehab Potential Excellent    PT Frequency 1x / week    PT Duration 12 weeks    PT Treatment/Interventions ADLs/Self Care Home Management;Biofeedback;Moist Heat;Cryotherapy;Therapeutic activities;Therapeutic exercise;Manual techniques;Patient/family education;Neuromuscular  re-education;Taping;Spinal Manipulations    PT Next Visit Plan work on diastasis, rib mobility; see if pelvis stays corrected, self massage of the pelvic floor, pelvic floor contraction on the ball, bodty mechanics, pelvic tilt in standing    PT Home Exercise Plan Access Code: [email or web address]    Consulted and Agree with Plan of Care Patient           Patient will benefit from skilled therapeutic intervention in order to improve the following deficits and impairments:  Decreased endurance, Pain, Decreased activity tolerance, Decreased range of motion, Impaired flexibility, Increased muscle spasms, Decreased strength  Visit Diagnosis: Muscle weakness (generalized) - Plan: PT plan of care cert/re-cert  Other lack of coordination - Plan: PT plan of care cert/re-cert  Low back pain without sciatica, unspecified back pain laterality, unspecified chronicity - Plan: PT plan of care cert/re-cert     Problem List Patient Active Problem List   Diagnosis Date Noted   [redacted] weeks gestation of pregnancy 11/17/2019   Nausea and vomiting during pregnancy prior to [redacted] weeks gestation 10/20/2019   Sickle  cell trait (HCC) 10/09/2019   Vaginal yeast infection 10/09/2019   ASCUS with positive high risk HPV cervical 09/18/2019   Supervision of other normal pregnancy, antepartum 08/11/2019   History of cesarean delivery 11/09/2018    Eulis Foster, PT 12/12/19 4:06 PM   [facility] [address] [place], [place], [address] Phone: [phone]   Fax:  [phone]  Name: Cheyenne Phillips MRN: [id number] Date of Birth: 15-Sep-1990

## 2019-12-12 NOTE — Patient Instructions (Signed)
Access Code: [email or web address] URL: [email or web address] Date: 12/12/2019 Prepared by: Eulis Foster  Exercises Child's Pose Stretch - 1 x daily - 7 x weekly - 3 sets - 10 reps Quadruped Cat Camel - 1 x daily - 7 x weekly - 3 sets - 10 reps Sidelying Thoracic and Shoulder Rotation - 1 x daily - 7 x weekly - 3 sets - 10 reps Eulis Foster, PT [facility] [address], [address] [place], [place] [address] W: [phone] [email or web address]

## 2019-12-13 ENCOUNTER — Encounter: Payer: Self-pay | Admitting: Obstetrics and Gynecology

## 2019-12-13 ENCOUNTER — Ambulatory Visit (INDEPENDENT_AMBULATORY_CARE_PROVIDER_SITE_OTHER): Payer: BC Managed Care – PPO | Admitting: Obstetrics and Gynecology

## 2019-12-13 VITALS — BP 120/72 | HR 82 | Wt 173.0 lb

## 2019-12-13 DIAGNOSIS — Z98891 History of uterine scar from previous surgery: Secondary | ICD-10-CM

## 2019-12-13 DIAGNOSIS — Z3480 Encounter for supervision of other normal pregnancy, unspecified trimester: Secondary | ICD-10-CM

## 2019-12-13 DIAGNOSIS — D573 Sickle-cell trait: Secondary | ICD-10-CM

## 2019-12-13 NOTE — Patient Instructions (Signed)

## 2019-12-13 NOTE — Progress Notes (Signed)
Subjective:  Cheyenne Phillips is a 29 y.o. [id number] at [redacted]w[redacted]d being seen today for ongoing prenatal care.  She is currently monitored for the following issues for this high-risk pregnancy and has History of cesarean delivery; Supervision of other normal pregnancy, antepartum; ASCUS with positive high risk HPV cervical; Sickle cell trait (HCC); and Nausea and vomiting during pregnancy prior to [redacted] weeks gestation on their problem list.  Patient reports no complaints.  Contractions: Not present. Vag. Bleeding: None.  Movement: Present. Denies leaking of fluid.   The following portions of the patient's history were reviewed and updated as appropriate: allergies, current medications, past family history, past medical history, past social history, past surgical history and problem list. Problem list updated.  Objective:   Vitals:   12/13/19 1325  BP: 120/72  Pulse: 82  Weight: 173 lb (78.5 kg)    Fetal Status:     Movement: Present     General:  Alert, oriented and cooperative. Patient is in no acute distress.  Skin: Skin is warm and dry. No rash noted.   Cardiovascular: Normal heart rate noted  Respiratory: Normal respiratory effort, no problems with respiration noted  Abdomen: Soft, gravid, appropriate for gestational age. Pain/Pressure: Present     Pelvic:  Cervical exam deferred        Extremities: Normal range of motion.  Edema: None  Mental Status: Normal mood and affect. Normal behavior. Normal judgment and thought content.   Urinalysis:      Assessment and Plan:  Pregnancy: [id number] at [redacted]w[redacted]d  1. Supervision of other normal pregnancy, antepartum Stable Glucola next visit  2. History of cesarean delivery Desires repeat  3. Sickle cell trait (HCC) UC with next OB appt  Preterm labor symptoms and general obstetric precautions including but not limited to vaginal bleeding, contractions, leaking of fluid and fetal movement were reviewed in detail with the patient. Please refer  to After Visit Summary for other counseling recommendations.  Return in about 3 weeks (around 01/03/2020) for OB visit, face to face, any provider, fasting for Glucola.   Hermina Staggers, MD

## 2019-12-13 NOTE — Progress Notes (Signed)
Patient presents for ROB. Patient has no concerns today. 

## 2019-12-18 ENCOUNTER — Ambulatory Visit (INDEPENDENT_AMBULATORY_CARE_PROVIDER_SITE_OTHER): Payer: BC Managed Care – PPO | Admitting: Obstetrics and Gynecology

## 2019-12-18 ENCOUNTER — Other Ambulatory Visit: Payer: Self-pay

## 2019-12-18 ENCOUNTER — Other Ambulatory Visit (HOSPITAL_COMMUNITY)
Admission: RE | Admit: 2019-12-18 | Discharge: 2019-12-18 | Disposition: A | Discharge status: Home / Self Care | Payer: Medicaid Other | Source: Ambulatory Visit | Attending: Obstetrics and Gynecology | Admitting: Obstetrics and Gynecology

## 2019-12-18 ENCOUNTER — Encounter: Payer: Self-pay | Admitting: Obstetrics and Gynecology

## 2019-12-18 VITALS — BP 113/70 | HR 85 | Wt 173.2 lb

## 2019-12-18 DIAGNOSIS — N898 Other specified noninflammatory disorders of vagina: Secondary | ICD-10-CM | POA: Diagnosis not present

## 2019-12-18 DIAGNOSIS — Z3A25 25 weeks gestation of pregnancy: Secondary | ICD-10-CM

## 2019-12-18 DIAGNOSIS — Z3480 Encounter for supervision of other normal pregnancy, unspecified trimester: Secondary | ICD-10-CM

## 2019-12-18 DIAGNOSIS — Z98891 History of uterine scar from previous surgery: Secondary | ICD-10-CM

## 2019-12-18 DIAGNOSIS — M25559 Pain in unspecified hip: Secondary | ICD-10-CM

## 2019-12-18 DIAGNOSIS — O368120 Decreased fetal movements, second trimester, not applicable or unspecified: Secondary | ICD-10-CM

## 2019-12-18 MED ORDER — TERCONAZOLE 0.4 % VA CREA: 1.0000 | TOPICAL_CREAM | Freq: Every day | VAGINAL | 0 refills | Status: DC

## 2019-12-18 NOTE — Progress Notes (Signed)
   PRENATAL VISIT NOTE  Subjective:  Cheyenne Phillips is a 29 y.o. [id number] at [redacted]w[redacted]d being seen today for ongoing prenatal care.  She is currently monitored for the following issues for this high-risk pregnancy and has History of cesarean delivery; Supervision of other normal pregnancy, antepartum; ASCUS with positive high risk HPV cervical; Sickle cell trait (HCC); and Nausea and vomiting during pregnancy prior to [redacted] weeks gestation on their problem list.  Patient reports vaginal discharge and hip pain. Was treated for BV, had green discharge and took meds. Then had thick white discharge that was sometimes white and sometimes green. Very irritated, no odor. Also reports decreased movement, movements are less and not as pronounced as they were previously. Contractions: Not present. Vag. Bleeding: None.  Movement: Present. Denies leaking of fluid.   The following portions of the patient's history were reviewed and updated as appropriate: allergies, current medications, past family history, past medical history, past social history, past surgical history and problem list.   Objective:   Vitals:   12/18/19 1551  BP: 113/70  Pulse: 85  Weight: 173 lb 3.2 oz (78.6 kg)    Fetal Status: Fetal Heart Rate (bpm): 145   Movement: Present     General:  Alert, oriented and cooperative. Patient is in no acute distress.  Skin: Skin is warm and dry. No rash noted.   Cardiovascular: Normal heart rate noted  Respiratory: Normal respiratory effort, no problems with respiration noted  Abdomen: Soft, gravid, appropriate for gestational age.  Pain/Pressure: Present     Pelvic: Cervical exam deferred        Extremities: Normal range of motion.  Edema: None  Mental Status: Normal mood and affect. Normal behavior. Normal judgment and thought content.   Assessment and Plan:  Pregnancy: [id number] at [redacted]w[redacted]d  1. History of cesarean delivery For TOLAC  2. Supervision of other normal pregnancy, antepartum  3.  [redacted] weeks gestation of pregnancy  4. Vaginal discharge Likely yeast 2/2 antibiotics terazol sent to pharmacy Self swab - Cervicovaginal ancillary only  5. Hip pain In PT  6. Decreased fetal movements in second trimester, single or unspecified fetus NST today, appropriate for GA  Preterm labor symptoms and general obstetric precautions including but not limited to vaginal bleeding, contractions, leaking of fluid and fetal movement were reviewed in detail with the patient. Please refer to After Visit Summary for other counseling recommendations.   Return in about 2 weeks (around 01/01/2020) for 2 hr GTT, low OB, in person, 3rd trim labs.  Future Appointments  Date Time Provider Department Center  12/19/2019  3:00 PM Theressa Millard, PT [facility]  01/03/2020  8:15 AM [facility] None  01/03/2020 11:15 AM Adam Phenix, MD [facility] None  01/16/2020  4:00 PM Theressa Millard, PT [facility]  01/23/2020  3:00 PM Theressa Millard, PT [facility]    Conan Bowens, MD

## 2019-12-18 NOTE — Progress Notes (Signed)
Patient presents for vaginal discharge and hip pain. Patient states that she has some thick, white vaginal discharge with some vaginal itching. She just finished her antibiotic this past Sunday.

## 2019-12-19 ENCOUNTER — Encounter: Payer: BC Managed Care – PPO | Admitting: Physical Therapy

## 2019-12-19 ENCOUNTER — Encounter: Payer: Medicaid Other | Admitting: Physical Therapy

## 2019-12-20 LAB — CERVICOVAGINAL ANCILLARY ONLY
Bacterial Vaginitis (gardnerella): POSITIVE — AB
Candida Glabrata: NEGATIVE
Candida Vaginitis: POSITIVE — AB
Comment: NEGATIVE
Comment: NEGATIVE
Comment: NEGATIVE

## 2019-12-26 ENCOUNTER — Encounter: Payer: Medicaid Other | Attending: Advanced Practice Midwife | Admitting: Physical Therapy

## 2019-12-26 ENCOUNTER — Other Ambulatory Visit: Payer: Self-pay

## 2019-12-26 ENCOUNTER — Encounter: Payer: Self-pay | Admitting: Physical Therapy

## 2019-12-26 DIAGNOSIS — R278 Other lack of coordination: Secondary | ICD-10-CM | POA: Diagnosis present

## 2019-12-26 DIAGNOSIS — M6281 Muscle weakness (generalized): Secondary | ICD-10-CM

## 2019-12-26 DIAGNOSIS — M545 Low back pain, unspecified: Secondary | ICD-10-CM

## 2019-12-26 NOTE — Patient Instructions (Signed)
Access Code: [email or web address] URL: [email or web address] Date: 12/26/2019 Prepared by: Eulis Foster  Exercises Child's Pose Stretch - 1 x daily - 7 x weekly - 1 sets - 1 reps Quadruped Cat Camel - 1 x daily - 7 x weekly - 1 sets - 10 reps Sidelying Thoracic and Shoulder Rotation - 1 x daily - 7 x weekly - 1 sets - 10 reps Seated Hip Adductor Stretch - 1 x daily - 7 x weekly - 1 sets - 2 reps - 30 sec hold Seated Piriformis Stretch - 1 x daily - 7 x weekly - 1 sets - 2 reps - 30 sec hold Seated Pelvic Floor Contraction - 3 x daily - 7 x weekly - 1 sets - 5 reps - 5 sec hold Eulis Foster, PT [facility] [address], [address] [place], [place] [address] W: [phone] [email or web address]

## 2019-12-26 NOTE — Therapy (Signed)
[facility] [address] [place], [place], [address] Phone: [phone]   Fax:  [phone]  Physical Therapy Treatment  Patient Details  Name: Cheyenne Phillips MRN: [id number] Date of Birth: 1990-08-31 Referring Provider (PT): Sharen Counter CNM   Encounter Date: 12/26/2019   PT End of Session - 12/26/19 1508    Visit Number 2    Date for PT Re-Evaluation 03/05/20    Authorization Type [employer]    PT Start Time 1505    PT Stop Time 1550    PT Time Calculation (min) 45 min    Activity Tolerance Patient tolerated treatment well;No increased pain    Behavior During Therapy WFL for tasks assessed/performed           Past Medical History:  Diagnosis Date  . Medical history non-contributory   . Seasonal allergies   . Sickle cell trait [facility])     Past Surgical History:  Procedure Laterality Date  . CESAREAN SECTION N/A 05/31/2013   Procedure: CESAREAN SECTION;  Surgeon: Brock Bad, MD;  Location: [facility];  Service: Obstetrics;  Laterality: N/A;  . WISDOM TOOTH EXTRACTION      There were no vitals filed for this visit.   Subjective Assessment - 12/26/19 1505    Subjective I felt good after the last treatment but when get back to work the pain increases. When I wear the brace I feel like I am suffocating    Patient Stated Goals Stop the pain    Currently in Pain? Yes    Pain Score 5     Pain Location Pelvis   back and stomach   Pain Orientation Right;Left    Pain Descriptors / Indicators Sharp    Pain Type Acute pain    Pain Onset 1 to 4 weeks ago    Pain Frequency Intermittent    Aggravating Factors  sitting up, laying down on back and side, standing up    Pain Relieving Factors nothing              [facility] PT Assessment - 12/26/19 0001      Palpation   Palpation comment no tenderness in the pelvic floor muscles                         [facility] Adult PT  Treatment/Exercise - 12/26/19 0001      Exercises   Exercises Other Exercises    Other Exercises  sitting on ball with pelvic floor contraction and ball giving tactile cues, sitting on ball and roll back and forth,  side to side      Lumbar Exercises: Stretches   Quadruped Mid Back Stretch 1 rep;30 seconds    Quadruped Mid Back Stretch Limitations childs pose, had trouble breathing    Piriformis Stretch Right;Left;1 rep;30 seconds    Piriformis Stretch Limitations sitting    Other Lumbar Stretch Exercise sidely trunk rotation 5 times each side; quadruped cat/cow 10x    Other Lumbar Stretch Exercise sitting hip adductor stretch 30 minutes each side      Lumbar Exercises: Quadruped   Madcat/Old Horse 10 reps    Madcat/Old Horse Limitations reduction ov movemetn of the L1-L5    Other Quadruped Lumbar Exercises quadruped with hip sway      Manual Therapy   Manual Therapy Joint mobilization;Soft tissue mobilization    Joint Mobilization gapping of the vertebrae of L1-L5 ind sidely  Soft tissue mobilization bilateral lumbar paraspinals in sidley                  PT Education - 12/26/19 1550    Education Details Access Code: [email or web address]    Person(s) Educated Patient    Methods Explanation;Demonstration;Verbal cues    Comprehension Returned demonstration;Verbalized understanding            PT Short Term Goals - 12/26/19 1557      PT SHORT TERM GOAL #1   Title independent with initial HEP    Time 4    Period Weeks    Status Achieved    Target Date 01/09/20      PT SHORT TERM GOAL #2   Title understand how to place the baby belly belt on correctly    Time 4    Period Weeks    Status Achieved             PT Long Term Goals - 12/12/19 1600      PT LONG TERM GOAL #1   Title independent with advanced HEP    Baseline not educated yet    Time 12    Period Weeks    Status New    Target Date 03/05/20      PT LONG TERM GOAL #2   Title back and pelvis pain </=  4/10 due to pelvis and back in correct alignment    Baseline pain level 10/10 and left ilium anteriorly rotated    Time 12    Period Weeks    Status New    Target Date 03/05/20      PT LONG TERM GOAL #3   Title able to lay on her side with pillows to support her back and pelvis to reduce her pain to mild    Baseline sleeps with pain st 10/10    Time 12    Period Weeks    Status New    Target Date 03/05/20      PT LONG TERM GOAL #4   Title understand how to contract her pelvic floor muscles correctly to control her urinary leakage and not feel damp all the time    Baseline leaks with laughing, coughing and gagging    Time 12    Period Weeks    Status New    Target Date 03/05/20                 Plan - 12/26/19 1510    Clinical Impression Statement Patient reports she did well after the last visit until she went to work. Patient has to stand alot at work. Patient has decreased mobility of the lumbar. Patient has weakness in the hip abductors and tightness in the hip adductors. Patient did not have a limp today but will walk with a waddle. Patient will benefit from skilled therapy to reduce her pain and improve continence.    Personal Factors and Comorbidities Comorbidity 2;Profession    Comorbidities presently pregnant; Stable umbilicus hernia    Examination-Activity Limitations Sit;Locomotion Level;Lift;Stand;Stairs;Continence    Examination-Participation Restrictions Cleaning;Occupation    Stability/Clinical Decision Making Stable/Uncomplicated    Rehab Potential Excellent    PT Frequency 1x / week    PT Duration 12 weeks    PT Treatment/Interventions ADLs/Self Care Home Management;Biofeedback;Moist Heat;Cryotherapy;Therapeutic activities;Therapeutic exercise;Manual techniques;Patient/family education;Neuromuscular re-education;Taping;Spinal Manipulations    PT Next Visit Plan hip hinging in quadruped; Pallof in standing, pelvic tightening in sidely; soft tissue work,  mobilize the lumbar; go over work  taks body mechanics    PT Home Exercise Plan Access Code: [email or web address]    Recommended Other Services MD signed note    Consulted and Agree with Plan of Care Patient           Patient will benefit from skilled therapeutic intervention in order to improve the following deficits and impairments:  Decreased endurance, Pain, Decreased activity tolerance, Decreased range of motion, Impaired flexibility, Increased muscle spasms, Decreased strength  Visit Diagnosis: Muscle weakness (generalized)  Other lack of coordination  Low back pain without sciatica, unspecified back pain laterality, unspecified chronicity     Problem List Patient Active Problem List   Diagnosis Date Noted  . Nausea and vomiting during pregnancy prior to [redacted] weeks gestation 10/20/2019  . Sickle cell trait (HCC) 10/09/2019  . 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 Supervision of other normal pregnancy, antepartum 08/11/2019  . History of cesarean delivery 11/09/2018    Eulis Foster, PT 12/26/19 3:58 PM   [facility] at [facility] [address], [address] [place], [place], [address] Phone: [phone]   Fax:  [phone]  Name: ANAYSHA ANDRE MRN: [id number] Date of Birth: 09-06-1990

## 2020-01-03 ENCOUNTER — Other Ambulatory Visit: Payer: Self-pay

## 2020-01-03 ENCOUNTER — Ambulatory Visit (INDEPENDENT_AMBULATORY_CARE_PROVIDER_SITE_OTHER): Payer: Medicaid Other | Admitting: Obstetrics & Gynecology

## 2020-01-03 ENCOUNTER — Other Ambulatory Visit: Payer: BC Managed Care – PPO

## 2020-01-03 VITALS — BP 119/69 | HR 90 | Wt 172.0 lb

## 2020-01-03 DIAGNOSIS — Z23 Encounter for immunization: Secondary | ICD-10-CM

## 2020-01-03 DIAGNOSIS — Z3480 Encounter for supervision of other normal pregnancy, unspecified trimester: Secondary | ICD-10-CM | POA: Diagnosis not present

## 2020-01-03 DIAGNOSIS — Z98891 History of uterine scar from previous surgery: Secondary | ICD-10-CM

## 2020-01-03 NOTE — Progress Notes (Signed)
   PRENATAL VISIT NOTE  Subjective:  Cheyenne Phillips is a 29 y.o. [id number] at [redacted]w[redacted]d being seen today for ongoing prenatal care.  She is currently monitored for the following issues for this high-risk pregnancy and has History of cesarean delivery; Supervision of other normal pregnancy, antepartum; ASCUS with positive high risk HPV cervical; Sickle cell trait (HCC); and Nausea and vomiting during pregnancy prior to [redacted] weeks gestation on their problem list.  Patient reports no complaints.  Contractions: Not present. Vag. Bleeding: None.  Movement: Present. Denies leaking of fluid.   The following portions of the patient's history were reviewed and updated as appropriate: allergies, current medications, past family history, past medical history, past social history, past surgical history and problem list.   Objective:   Vitals:   01/03/20 0843  BP: 119/69  Pulse: 90  Weight: 172 lb (78 kg)    Fetal Status: Fetal Heart Rate (bpm): 130   Movement: Present     General:  Alert, oriented and cooperative. Patient is in no acute distress.  Skin: Skin is warm and dry. No rash noted.   Cardiovascular: Normal heart rate noted  Respiratory: Normal respiratory effort, no problems with respiration noted  Abdomen: Soft, gravid, appropriate for gestational age.  Pain/Pressure: Present     Pelvic: Cervical exam deferred        Extremities: Normal range of motion.     Mental Status: Normal mood and affect. Normal behavior. Normal judgment and thought content.   Assessment and Plan:  Pregnancy: [id number] at [redacted]w[redacted]d 1. Supervision of other normal pregnancy, antepartum 2 hr GTT  2. History of cesarean delivery Consider second TOLAC  Preterm labor symptoms and general obstetric precautions including but not limited to vaginal bleeding, contractions, leaking of fluid and fetal movement were reviewed in detail with the patient. Please refer to After Visit Summary for other counseling recommendations.    Return in about 2 weeks (around 01/17/2020) for MD, sign TOLAC form.  Future Appointments  Date Time Provider Department Center  01/03/2020 11:15 AM Adam Phenix, MD [facility] None  01/09/2020  4:00 PM Theressa Millard, PT [facility]  01/16/2020  4:00 PM Theressa Millard, PT [facility]  01/23/2020  3:00 PM Theressa Millard, PT [facility]  02/06/2020  4:00 PM Theressa Millard, PT [facility]    Scheryl Darter, MD

## 2020-01-03 NOTE — Patient Instructions (Signed)
Vaginal Birth After Cesarean Delivery  Vaginal birth after cesarean delivery (VBAC) is giving birth vaginally after previously delivering a baby through a cesarean section (C-section). A VBAC may be a safe option for you, depending on your health and other factors. It is important to discuss VBAC with your health care provider early in your pregnancy so you can understand the risks, benefits, and options. Having these discussions early will give you time to make your birth plan. Who are the best candidates for VBAC? The best candidates for VBAC are women who:  Have had one or two prior cesarean deliveries, and the incision made during the delivery was horizontal (low transverse).  Do not have a vertical (classical) scar on their uterus.  Have not had a tear in the wall of their uterus (uterine rupture).  Plan to have more pregnancies. A VBAC is also more likely to be successful:  In women who have previously given birth vaginally.  When labor starts by itself (spontaneously) before the due date. What are the benefits of VBAC? The benefits of delivering your baby vaginally instead of by a cesarean delivery include:  A shorter hospital stay.  A faster recovery time.  Less pain.  Avoiding risks associated with major surgery, such as infection and blood clots.  Less blood loss and less need for donated blood (transfusions). What are the risks of VBAC? The main risk of attempting a VBAC is that it may fail, forcing your health care provider to deliver your baby by a C-section. Other risks are rare and include:  Tearing (rupture) of the scar from a past cesarean delivery.  Other risks associated with vaginal deliveries. If a repeat cesarean delivery is needed, the risks include:  Blood loss.  Infection.  Blood clot.  Damage to surrounding organs.  Removal of the uterus (hysterectomy), if it is damaged.  Placenta problems in future pregnancies. What else should I know  about my options? Delivering a baby through a VBAC is similar to having a normal spontaneous vaginal delivery. Therefore, it is safe:  To try with twins.  For your health care provider to try to turn the baby from a breech position (external cephalic version) during labor.  With epidural analgesia for pain relief. Consider where you would like to deliver your baby. VBAC should be attempted in facilities where an emergency cesarean delivery can be performed. VBAC is not recommended for home births. Any changes in your health or your baby's health during your pregnancy may make it necessary to change your initial decision about VBAC. Your health care provider may recommend that you do not attempt a VBAC if:  Your baby's suspected weight is 8.8 lb (4 kg) or more.  You have preeclampsia. This is a condition that causes high blood pressure along with other symptoms, such as swelling and headaches.  You will have VBAC less than 19 months after your cesarean delivery.  You are past your due date.  You need to have labor started (induced) because your cervix is not ready for labor (unfavorable). Where to find more information  American Pregnancy Association: americanpregnancy.org  American Congress of Obstetricians and Gynecologists: acog.org Summary  Vaginal birth after cesarean delivery (VBAC) is giving birth vaginally after previously delivering a baby through a cesarean section (C-section). A VBAC may be a safe option for you, depending on your health and other factors.  Discuss VBAC with your health care provider early in your pregnancy so you can understand the risks, benefits, options, and   have plenty of time to make your birth plan.  The main risk of attempting a VBAC is that it may fail, forcing your health care provider to deliver your baby by a C-section. Other risks are rare. This information is not intended to replace advice given to you by your health care provider. Make sure  you discuss any questions you have with your health care provider. Document Revised: 05/03/2018 Document Reviewed: 04/14/2016 Elsevier Patient Education  2020 [employer]  

## 2020-01-04 LAB — GLUCOSE TOLERANCE, 2 HOURS W/ 1HR
Glucose, 1 hour: 115 mg/dL (ref 65–179)
Glucose, 2 hour: 109 mg/dL (ref 65–152)
Glucose, Fasting: 72 mg/dL (ref 65–91)

## 2020-01-04 LAB — CBC
Hematocrit: 30.2 % — ABNORMAL LOW (ref 34.0–46.6)
Hemoglobin: 10.6 g/dL — ABNORMAL LOW (ref 11.1–15.9)
MCH: 29.2 pg (ref 26.6–33.0)
MCHC: 35.1 g/dL (ref 31.5–35.7)
MCV: 83 fL (ref 79–97)
Platelets: 169 10*3/uL (ref 150–450)
RBC: 3.63 x10E6/uL — ABNORMAL LOW (ref 3.77–5.28)
RDW: 13.6 % (ref 11.7–15.4)
WBC: 7.8 10*3/uL (ref 3.4–10.8)

## 2020-01-04 LAB — HIV ANTIBODY (ROUTINE TESTING W REFLEX): HIV Screen 4th Generation wRfx: NONREACTIVE

## 2020-01-04 LAB — RPR: RPR Ser Ql: NONREACTIVE

## 2020-01-09 ENCOUNTER — Encounter: Payer: Medicaid Other | Admitting: Physical Therapy

## 2020-01-09 ENCOUNTER — Encounter: Payer: Self-pay | Admitting: Physical Therapy

## 2020-01-09 ENCOUNTER — Other Ambulatory Visit: Payer: Self-pay

## 2020-01-09 DIAGNOSIS — R278 Other lack of coordination: Secondary | ICD-10-CM

## 2020-01-09 DIAGNOSIS — M6281 Muscle weakness (generalized): Secondary | ICD-10-CM

## 2020-01-09 DIAGNOSIS — M545 Low back pain, unspecified: Secondary | ICD-10-CM

## 2020-01-09 NOTE — Therapy (Addendum)
[place] [facility] [address], [place], [place], [address] Phone: [phone]   Fax:  [phone]  Physical Therapy Treatment  Patient Details  Name: Cheyenne Phillips MRN: [id number] Date of Birth: Jan 09, 1991 Referring Provider (PT): Fatima Blank CNM   Encounter Date: 01/09/2020   PT End of Session - 01/09/20 1653     Visit Number 3    Date for PT Re-Evaluation 03/05/20    Authorization Type [employer]/ [employer]    PT Start Time 1600    PT Stop Time 1645    PT Time Calculation (min) 45 min    Activity Tolerance Patient tolerated treatment well;No increased pain    Behavior During Therapy [facility] for tasks assessed/performed             Past Medical History:  Diagnosis Date   Medical history non-contributory    Seasonal allergies    Sickle cell trait ([place])     Past Surgical History:  Procedure Laterality Date   CESAREAN SECTION N/A 05/31/2013   Procedure: CESAREAN SECTION;  Surgeon: Shelly Bombard, MD;  Location: [facility];  Service: Obstetrics;  Laterality: N/A;   WISDOM TOOTH EXTRACTION      There were no vitals filed for this visit.   Subjective Assessment - 01/09/20 1604     Subjective I am having pressure and stiffness. Pain in the pelvis and hips.    Patient Stated Goals Stop the pain    Currently in Pain? Yes    Pain Score 5     Pain Location Pelvis    Pain Orientation Right;Left    Pain Descriptors / Indicators Sharp    Pain Type Acute pain    Pain Onset 1 to 4 weeks ago    Pain Frequency Intermittent    Aggravating Factors  sitting, laying down on back and side, standing up    Pain Relieving Factors using a pregnancy pillow                [facility] PT Assessment - 01/09/20 0001       AROM   Lumbar Flexion full and no deviation    Lumbar - Right Rotation decreased by 25%      Strength   Right Hip Flexion 4/5      Palpation   SI assessment  ASIS                            [facility] Adult PT Treatment/Exercise - 01/09/20 0001       Therapeutic Activites    Therapeutic Activities Other Therapeutic Activities    Other Therapeutic Activities posture in standing and walking with slight post. tilt, TA contraction and guluteal squeeze wiht heel strike      Lumbar Exercises: Stretches   Other Lumbar Stretch Exercise thoracic rotation in sitting but does better in standing, not able to to thoracic rotation in sidely due to difficulty breathing      Lumbar Exercises: Standing   Other Standing Lumbar Exercises standing [place] with red band 10x each way      Lumbar Exercises: Quadruped   Other Quadruped Lumbar Exercises quadruped with red band around the knees with iliacus pullback 10x each side      Knee/Hip Exercises: Seated   Hamstring Curl Strengthening;Left;10 reps    Hamstring Limitations tried on right but caused pain in right groid    Abduction/Adduction  Strengthening;Both;1 set;5 reps  Abd/Adduction Limitations adduction but caused pelvic pain      Manual Therapy   Manual Therapy Soft tissue mobilization    Soft tissue mobilization using the addaday to the lumbar paraspianls and gluteal                    PT Education - 01/09/20 1651     Education Details Access Code: [email or web address]    Person(s) Educated Patient    Methods Explanation;Demonstration;Verbal cues;Handout    Comprehension Returned demonstration;Verbalized understanding              PT Short Term Goals - 12/26/19 1557       PT SHORT TERM GOAL #1   Title independent with initial HEP    Time 4    Period Weeks    Status Achieved    Target Date 01/09/20      PT SHORT TERM GOAL #2   Title understand how to place the baby belly belt on correctly    Time 4    Period Weeks    Status Achieved               PT Long Term Goals - 01/09/20 1606       PT LONG TERM GOAL #1   Title independent with advanced HEP    Time 12    Period Weeks     Status On-going      PT LONG TERM GOAL #2   Title back and pelvis pain </= 4/10 due to pelvis and back in correct alignment    Baseline pain 5/10    Time 12    Period Weeks    Status On-going      PT LONG TERM GOAL #3   Title able to lay on her side with pillows to support her back and pelvis to reduce her pain to mild    Baseline pain 5/10    Time 12    Period Weeks    Status On-going      PT LONG TERM GOAL #4   Title understand how to contract her pelvic floor muscles correctly to control her urinary leakage and not feel damp all the time    Baseline leaks with laughing, coughing and gagging    Time 12    Period Weeks    Status On-going                   Plan - 01/09/20 1655     Clinical Impression Statement Patient pain level is now 5/10 instead of 10/10. Patient right hip flexion is now 4/5 compared to 3+/5. Patient lumbar flexion is full with no deviation. Patient is not able to do hip adduction exercises due to pain in the pelvic area. Patient has tried different postural corrections and still has some discomfort. Patient has tightness in the lumbar paraspinals. She is not able to do the triunk rotation in sidely due to difficulty breathing but able to do in standing. Patient will benefi tfrom skilled therapy to reduce her pain and improve continence.    Personal Factors and Comorbidities Comorbidity 2;Profession    Comorbidities presently pregnant; Stable umbilicus hernia    Examination-Activity Limitations Sit;Locomotion Level;Lift;Stand;Stairs;Continence    Examination-Participation Restrictions Cleaning;Occupation    Stability/Clinical Decision Making Stable/Uncomplicated    Rehab Potential Excellent    PT Frequency 1x / week    PT Duration 12 weeks    PT Treatment/Interventions ADLs/Self Care Home Management;Biofeedback;Moist Heat;Cryotherapy;Therapeutic activities;Therapeutic exercise;Manual techniques;Patient/family education;Neuromuscular  re-education;Taping;Spinal  Manipulations    PT Next Visit Plan band around thighs with slight squat; gluteus medius exercise,assess continence    PT Home Exercise Plan Access Code: [email or web address]    Consulted and Agree with Plan of Care Patient             Patient will benefit from skilled therapeutic intervention in order to improve the following deficits and impairments:  Decreased endurance,Pain,Decreased activity tolerance,Decreased range of motion,Impaired flexibility,Increased muscle spasms,Decreased strength  Visit Diagnosis: Muscle weakness (generalized)  Other lack of coordination  Low back pain without sciatica, unspecified back pain laterality, unspecified chronicity     Problem List Patient Active Problem List   Diagnosis Date Noted   Nausea and vomiting during pregnancy prior to [redacted] weeks gestation 10/20/2019   Sickle cell trait ([place]) 10/09/2019   ASCUS with positive high risk HPV cervical 09/18/2019   Supervision of other normal pregnancy, antepartum 08/11/2019   History of cesarean delivery 11/09/2018    Earlie Counts, PT 01/09/20 5:01 PM   [place] at [facility] [address] [place], [place], [address] Phone: [phone]   Fax:  [phone]  Name: KENLEY RETTINGER MRN: [id number] Date of Birth: 1990/04/28  PHYSICAL THERAPY DISCHARGE SUMMARY  Visits from Start of Care: 3  Current functional level related to goals / functional outcomes: See above. Patient discharged due to not returning after last visit on 01/09/2020.    Remaining deficits: See above.    Education / Equipment: HEP   Patient agrees to discharge. Patient goals were not met. Patient is being discharged due to not returning since the last visit. Thank you for the referral. Earlie Counts, PT 06/17/21 8:17 AM

## 2020-01-09 NOTE — Patient Instructions (Addendum)
Access Code: [email or web address] URL: [email or web address] Date: 01/09/2020 Prepared by: Eulis Foster  Exercises Quadruped Cat Camel - 1 x daily - 7 x weekly - 1 sets - 10 reps Seated Hip Adductor Stretch - 1 x daily - 7 x weekly - 1 sets - 2 reps - 30 sec hold Seated Piriformis Stretch - 1 x daily - 7 x weekly - 1 sets - 2 reps - 30 sec hold Seated Pelvic Floor Contraction - 3 x daily - 7 x weekly - 1 sets - 5 reps - 5 sec hold Standing Thoracic Spine Stretch - 1 x daily - 7 x weekly - 1 sets - 1 reps - 15 sec hold Seated Hamstring Curl with Anchored Resistance - 1 x daily - 7 x weekly - 2 sets - 10 reps Standing Anti-Rotation Press with Anchored Resistance - 1 x daily - 7 x weekly - 1 sets - 10 reps Eulis Foster, PT [facility] [address], [address] [place], [place] [address] W: [phone] [email or web address]

## 2020-01-16 ENCOUNTER — Encounter: Payer: Medicaid Other | Admitting: Physical Therapy

## 2020-01-18 ENCOUNTER — Ambulatory Visit (INDEPENDENT_AMBULATORY_CARE_PROVIDER_SITE_OTHER): Payer: Medicaid Other | Admitting: Obstetrics & Gynecology

## 2020-01-18 ENCOUNTER — Encounter: Payer: Self-pay | Admitting: Obstetrics & Gynecology

## 2020-01-18 ENCOUNTER — Other Ambulatory Visit: Payer: Self-pay

## 2020-01-18 VITALS — BP 125/71 | HR 94 | Wt 171.0 lb

## 2020-01-18 DIAGNOSIS — Z3A30 30 weeks gestation of pregnancy: Secondary | ICD-10-CM

## 2020-01-18 DIAGNOSIS — Z3480 Encounter for supervision of other normal pregnancy, unspecified trimester: Secondary | ICD-10-CM

## 2020-01-18 NOTE — Progress Notes (Signed)
Pt presents for ROB requesting another u/s  TOLAC signed today

## 2020-01-18 NOTE — Patient Instructions (Signed)
Vaginal Birth After Cesarean Delivery  Vaginal birth after cesarean delivery (VBAC) is giving birth vaginally after previously delivering a baby through a cesarean section (C-section). A VBAC may be a safe option for you, depending on your health and other factors. It is important to discuss VBAC with your health care provider early in your pregnancy so you can understand the risks, benefits, and options. Having these discussions early will give you time to make your birth plan. Who are the best candidates for VBAC? The best candidates for VBAC are women who:  Have had one or two prior cesarean deliveries, and the incision made during the delivery was horizontal (low transverse).  Do not have a vertical (classical) scar on their uterus.  Have not had a tear in the wall of their uterus (uterine rupture).  Plan to have more pregnancies. A VBAC is also more likely to be successful:  In women who have previously given birth vaginally.  When labor starts by itself (spontaneously) before the due date. What are the benefits of VBAC? The benefits of delivering your baby vaginally instead of by a cesarean delivery include:  A shorter hospital stay.  A faster recovery time.  Less pain.  Avoiding risks associated with major surgery, such as infection and blood clots.  Less blood loss and less need for donated blood (transfusions). What are the risks of VBAC? The main risk of attempting a VBAC is that it may fail, forcing your health care provider to deliver your baby by a C-section. Other risks are rare and include:  Tearing (rupture) of the scar from a past cesarean delivery.  Other risks associated with vaginal deliveries. If a repeat cesarean delivery is needed, the risks include:  Blood loss.  Infection.  Blood clot.  Damage to surrounding organs.  Removal of the uterus (hysterectomy), if it is damaged.  Placenta problems in future pregnancies. What else should I know  about my options? Delivering a baby through a VBAC is similar to having a normal spontaneous vaginal delivery. Therefore, it is safe:  To try with twins.  For your health care provider to try to turn the baby from a breech position (external cephalic version) during labor.  With epidural analgesia for pain relief. Consider where you would like to deliver your baby. VBAC should be attempted in facilities where an emergency cesarean delivery can be performed. VBAC is not recommended for home births. Any changes in your health or your baby's health during your pregnancy may make it necessary to change your initial decision about VBAC. Your health care provider may recommend that you do not attempt a VBAC if:  Your baby's suspected weight is 8.8 lb (4 kg) or more.  You have preeclampsia. This is a condition that causes high blood pressure along with other symptoms, such as swelling and headaches.  You will have VBAC less than 19 months after your cesarean delivery.  You are past your due date.  You need to have labor started (induced) because your cervix is not ready for labor (unfavorable). Where to find more information  American Pregnancy Association: americanpregnancy.org  American Congress of Obstetricians and Gynecologists: acog.org Summary  Vaginal birth after cesarean delivery (VBAC) is giving birth vaginally after previously delivering a baby through a cesarean section (C-section). A VBAC may be a safe option for you, depending on your health and other factors.  Discuss VBAC with your health care provider early in your pregnancy so you can understand the risks, benefits, options, and   have plenty of time to make your birth plan.  The main risk of attempting a VBAC is that it may fail, forcing your health care provider to deliver your baby by a C-section. Other risks are rare. This information is not intended to replace advice given to you by your health care provider. Make sure  you discuss any questions you have with your health care provider. Document Revised: 05/03/2018 Document Reviewed: 04/14/2016 Elsevier Patient Education  2020 [employer]  

## 2020-01-18 NOTE — Progress Notes (Signed)
   PRENATAL VISIT NOTE  Subjective:  Cheyenne Phillips is a 29 y.o. [id number] at [redacted]w[redacted]d being seen today for ongoing prenatal care.  She is currently monitored for the following issues for this high-risk pregnancy and has History of cesarean delivery; Supervision of other normal pregnancy, antepartum; ASCUS with positive high risk HPV cervical; Sickle cell trait (HCC); and Nausea and vomiting during pregnancy prior to [redacted] weeks gestation on their problem list.  Patient reports pressure.  Contractions: Not present. Vag. Bleeding: None.  Movement: Present. Denies leaking of fluid.   The following portions of the patient's history were reviewed and updated as appropriate: allergies, current medications, past family history, past medical history, past social history, past surgical history and problem list.   Objective:   Vitals:   01/18/20 1449  BP: 125/71  Pulse: 94  Weight: 171 lb (77.6 kg)    Fetal Status: Fetal Heart Rate (bpm): 145    Movement: Present     General:  Alert, oriented and cooperative. Patient is in no acute distress.  Skin: Skin is warm and dry. No rash noted.   Cardiovascular: Normal heart rate noted  Respiratory: Normal respiratory effort, no problems with respiration noted  Abdomen: Soft, gravid, appropriate for gestational age.  Pain/Pressure: Present     Pelvic: Cervical exam deferred        Extremities: Normal range of motion.  Edema: None  Mental Status: Normal mood and affect. Normal behavior. Normal judgment and thought content.   Assessment and Plan:  Pregnancy: [id number] at [redacted]w[redacted]d 1. Supervision of other normal pregnancy, antepartum Routine prenatal care  2. [redacted] weeks gestation of pregnancy Poor weight gain, misses meals due to work schedule working nights  Preterm labor symptoms and general obstetric precautions including but not limited to vaginal bleeding, contractions, leaking of fluid and fetal movement were reviewed in detail with the patient. Please  refer to After Visit Summary for other counseling recommendations.   Return in about 2 weeks (around 02/01/2020).  Future Appointments  Date Time Provider Department Center  01/23/2020  3:00 PM [facility]  02/01/2020  9:45 AM Constant, Gigi Gin, MD [facility] None  02/06/2020  4:00 PM Theressa Millard, PT [facility]    Scheryl Darter, MD

## 2020-01-20 LAB — URINE CULTURE, OB REFLEX

## 2020-01-20 LAB — CULTURE, OB URINE

## 2020-01-20 NOTE — L&D Delivery Note (Addendum)
OB/GYN Faculty Practice Delivery Note  Cheyenne Phillips is a 30 y.o. [id number] s/p VBAC at [redacted]w[redacted]d. She was admitted for SOL.   ROM: 0h 25m with yellow meconium fluid GBS Status: Negative/-- (02/10 0950) Maximum Maternal Temperature:  98  Labor Progress: . Patient arrived at 1.5 cm dilation and progressed spontaneously without any augmentation.   Delivery Date/Time: 03/23/2020 at 0411 Delivery: Called to room and patient was complete and head was crowning without patient pushing. Head delivered in ROA position. Nuchal cord present, delivered through and reduced. Shoulder and body delivered in usual fashion. Infant with spontaneous cry, placed on mother's abdomen, dried and stimulated. Cord clamped x 2 after 1-minute delay, and cut by Dr Myriam Jacobson. Cord blood drawn. Placenta delivered spontaneously with gentle cord traction. Fundus firm with massage and Pitocin. Labia, perineum, vagina, and cervix inspected with several irregular hemostatic abrasions.   Placenta: complete, spontaneous, 3 vessel cord 3 Complications: None  Lacerations: small hemostatic abrasions which did not require repair  EBL: 200 Analgesia: Epidural    Infant: APGAR (1 MIN): 8   APGAR (5 MINS): 9   APGAR (10 MINS):    Weight: Pending   Derrel Nip, MD  PGY-2, [facility]  03/23/2020 4:27 AM   I was present and gloved for entirtey of delivery. I agree with the findings and the plan of care as documented in the resident's note.  Casper Harrison, MD [facility] Fellow, [facility] for [facility], [facility]

## 2020-01-23 ENCOUNTER — Telehealth: Payer: Self-pay | Admitting: Physical Therapy

## 2020-01-23 ENCOUNTER — Encounter: Payer: Medicaid Other | Attending: Advanced Practice Midwife | Admitting: Physical Therapy

## 2020-01-23 DIAGNOSIS — R278 Other lack of coordination: Secondary | ICD-10-CM | POA: Insufficient documentation

## 2020-01-23 DIAGNOSIS — M545 Low back pain, unspecified: Secondary | ICD-10-CM | POA: Insufficient documentation

## 2020-01-23 DIAGNOSIS — M6281 Muscle weakness (generalized): Secondary | ICD-10-CM | POA: Insufficient documentation

## 2020-01-23 NOTE — Telephone Encounter (Signed)
Called patient about her missed appointment today at 15:00. Left a message.  Eulis Foster, PT @1 /04/2020@ 3:33 PM

## 2020-01-24 ENCOUNTER — Telehealth: Payer: Self-pay

## 2020-01-24 NOTE — Telephone Encounter (Signed)
Pt called concerned about precautions and things to do since recently testing + for COVID -19 Pt made aware of safe OTC Rx's If any SOB, chest pain ,fever etc pt advised to go to ER. Pt made aware virus has to run it's course but to pay attention to symptoms. Pt agreeable and voiced understanding.

## 2020-02-01 ENCOUNTER — Telehealth (INDEPENDENT_AMBULATORY_CARE_PROVIDER_SITE_OTHER): Payer: Medicaid Other | Admitting: Obstetrics and Gynecology

## 2020-02-01 ENCOUNTER — Encounter: Payer: Self-pay | Admitting: Obstetrics and Gynecology

## 2020-02-01 ENCOUNTER — Other Ambulatory Visit: Payer: Self-pay

## 2020-02-01 ENCOUNTER — Telehealth: Payer: Self-pay

## 2020-02-01 DIAGNOSIS — Z3480 Encounter for supervision of other normal pregnancy, unspecified trimester: Secondary | ICD-10-CM

## 2020-02-01 DIAGNOSIS — R87810 Cervical high risk human papillomavirus (HPV) DNA test positive: Secondary | ICD-10-CM

## 2020-02-01 DIAGNOSIS — R87610 Atypical squamous cells of undetermined significance on cytologic smear of cervix (ASC-US): Secondary | ICD-10-CM

## 2020-02-01 DIAGNOSIS — Z98891 History of uterine scar from previous surgery: Secondary | ICD-10-CM

## 2020-02-01 NOTE — Telephone Encounter (Signed)
TC to pt to make aware appt can be virtual today No answer LVM and office call back number.

## 2020-02-01 NOTE — Progress Notes (Signed)
   OBSTETRICS PRENATAL VIRTUAL VISIT ENCOUNTER NOTE  Provider location: Center for [facility]   I connected with Cheyenne Phillips on 02/01/20 at  9:45 AM EST by MyChart Video Encounter at home and verified that I am speaking with the correct person using two identifiers.   I discussed the limitations, risks, security and privacy concerns of performing an evaluation and management service virtually and the availability of in person appointments. I also discussed with the patient that there may be a patient responsible charge related to this service. The patient expressed understanding and agreed to proceed. Subjective:  Cheyenne Phillips is a 30 y.o. [id number] at [redacted]w[redacted]d being seen today for ongoing prenatal care.  She is currently monitored for the following issues for this low-risk pregnancy and has History of cesarean delivery; Supervision of other normal pregnancy, antepartum; ASCUS with positive high risk HPV cervical; and Sickle cell trait (HCC) on their problem list.  Patient reports no complaints. Patient with recent covid infection on 01/22/2019 but reports doing well at this time.  Contractions: Irritability. Vag. Bleeding: None.  Movement: Present. Denies any leaking of fluid.   The following portions of the patient's history were reviewed and updated as appropriate: allergies, current medications, past family history, past medical history, past social history, past surgical history and problem list.   Objective:  There were no vitals filed for this visit.  Fetal Status:     Movement: Present     General:  Alert, oriented and cooperative. Patient is in no acute distress.  Respiratory: Normal respiratory effort, no problems with respiration noted  Mental Status: Normal mood and affect. Normal behavior. Normal judgment and thought content.  Rest of physical exam deferred due to type of encounter  Imaging: No results found.  Assessment and Plan:  Pregnancy:  [id number] at [redacted]w[redacted]d 1. Supervision of other normal pregnancy, antepartum Patient is doing well without complaints  2. ASCUS with positive high risk HPV cervical Colposcopy pp  3. History of cesarean delivery Patient plans VBAC  Preterm labor symptoms and general obstetric precautions including but not limited to vaginal bleeding, contractions, leaking of fluid and fetal movement were reviewed in detail with the patient. I discussed the assessment and treatment plan with the patient. The patient was provided an opportunity to ask questions and all were answered. The patient agreed with the plan and demonstrated an understanding of the instructions. The patient was advised to call back or seek an in-person office evaluation/go to MAU at [facility] for any urgent or concerning symptoms. Please refer to After Visit Summary for other counseling recommendations.   I provided 15 minutes of face-to-face time during this encounter.  Return in about 2 weeks (around 02/15/2020) for Virtual, ROB, Low risk.  Future Appointments  Date Time Provider Department Center  02/01/2020  9:45 AM Kathleen Tamm, Gigi Gin, MD [facility] None  02/06/2020  4:00 PM Theressa Millard, PT [facility]    Catalina Antigua, MD Center for [facility], [facility]

## 2020-02-01 NOTE — Progress Notes (Signed)
Pt states recent Covid.  Pt is having increase back pain and ?ctx.

## 2020-02-06 ENCOUNTER — Encounter: Payer: Self-pay | Admitting: Physical Therapy

## 2020-02-13 ENCOUNTER — Telehealth: Payer: Self-pay | Admitting: *Deleted

## 2020-02-13 NOTE — Telephone Encounter (Signed)
Pt called to office asking about when/how she can start her LOA.  Attempt to return call. LM on VM making pt aware she will need to discuss with there provider at her next appt.  Advised that she get paperwork needed turned in to office to be completed.

## 2020-02-15 ENCOUNTER — Encounter: Payer: Self-pay | Admitting: Obstetrics and Gynecology

## 2020-02-15 ENCOUNTER — Telehealth (INDEPENDENT_AMBULATORY_CARE_PROVIDER_SITE_OTHER): Payer: Medicaid Other | Admitting: Obstetrics and Gynecology

## 2020-02-15 DIAGNOSIS — O99891 Other specified diseases and conditions complicating pregnancy: Secondary | ICD-10-CM

## 2020-02-15 DIAGNOSIS — N898 Other specified noninflammatory disorders of vagina: Secondary | ICD-10-CM

## 2020-02-15 DIAGNOSIS — Z3480 Encounter for supervision of other normal pregnancy, unspecified trimester: Secondary | ICD-10-CM

## 2020-02-15 DIAGNOSIS — Z3A34 34 weeks gestation of pregnancy: Secondary | ICD-10-CM

## 2020-02-15 NOTE — Progress Notes (Signed)
    TELEHEALTH OBSTETRICS VISIT ENCOUNTER NOTE  Provider location: Center for [facility] at [place]   I connected with Cheyenne Phillips on 02/15/20 at  9:55 AM EST by telephone at home and verified that I am speaking with the correct person using two identifiers. Of note, unable to do video encounter due to technical difficulties.    I discussed the limitations, risks, security and privacy concerns of performing an evaluation and management service by telephone and the availability of in person appointments. I also discussed with the patient that there may be a patient responsible charge related to this service. The patient expressed understanding and agreed to proceed.  Patient is in the car, provider is at the office.   Subjective:  Cheyenne Phillips is a 30 y.o. [id number] at [redacted]w[redacted]d being followed for ongoing prenatal care.  She is currently monitored for the following issues for this low-risk pregnancy and has History of cesarean delivery; Supervision of other normal pregnancy, antepartum; ASCUS with positive high risk HPV cervical; and Sickle cell trait (HCC) on their problem list.  Patient reports odor to discharge, with an increase in discharge.. Reports fetal movement. Denies any contractions, bleeding or leaking of fluid.   The following portions of the patient's history were reviewed and updated as appropriate: allergies, current medications, past family history, past medical history, past social history, past surgical history and problem list.   Objective:  Last menstrual period 06/22/2019. General:  Alert, oriented and cooperative.   Mental Status: Normal mood and affect perceived. Normal judgment and thought content.  Rest of physical exam deferred due to type of encounter  Assessment and Plan:  Pregnancy: [id number] at [redacted]w[redacted]d 1. Supervision of other normal pregnancy, antepartum  Continues to have issues with vaginal discharge with odor. Hx of recurring yeast and BV. +  odor, non fishy smell.  She plans to try boric acid suppository's after baby is born.  She does not have BP cuff with her. Nurse visit tomorrow for vaginal swabs and BP check.  Doing well, feeling well. Off and on HA's, no vision changes.   Preterm labor symptoms and general obstetric precautions including but not limited to vaginal bleeding, contractions, leaking of fluid and fetal movement were reviewed in detail with the patient.  I discussed the assessment and treatment plan with the patient. The patient was provided an opportunity to ask questions and all were answered. The patient agreed with the plan and demonstrated an understanding of the instructions. The patient was advised to call back or seek an in-person office evaluation/go to MAU at [facility] for any urgent or concerning symptoms. Please refer to After Visit Summary for other counseling recommendations.   I provided 11 minutes of non-face-to-face time during this encounter.  Return in about 1 day (around 02/16/2020) for please schedule nurse visit tomorrow for vaginal swabs and BP check .  Future Appointments  Date Time Provider Department Center  02/16/2020  8:40 AM [facility] NURSE [facility] None  02/29/2020  9:15 AM Rasch, Harolyn Rutherford, NP [facility] None    Venia Carbon, NP Center for [employer], [facility]

## 2020-02-15 NOTE — Progress Notes (Signed)
Virtual ROB  34w  CC: just finished Monistat OTC for vaginal irritation , pt states she has not noticed relief pt also notes vaginal odor.  Pt advised to discuss tx Rx with provider.  Constant HA pt notes drinking water .

## 2020-02-16 ENCOUNTER — Other Ambulatory Visit: Payer: Self-pay

## 2020-02-16 ENCOUNTER — Other Ambulatory Visit (HOSPITAL_COMMUNITY)
Admission: RE | Admit: 2020-02-16 | Discharge: 2020-02-16 | Disposition: A | Discharge status: Home / Self Care | Payer: Medicaid Other | Source: Ambulatory Visit | Attending: Obstetrics | Admitting: Obstetrics

## 2020-02-16 ENCOUNTER — Ambulatory Visit: Payer: Medicaid Other

## 2020-02-16 VITALS — BP 111/68 | HR 80

## 2020-02-16 DIAGNOSIS — N898 Other specified noninflammatory disorders of vagina: Secondary | ICD-10-CM | POA: Insufficient documentation

## 2020-02-16 NOTE — Progress Notes (Signed)
SUBJECTIVE:  30 y.o. female complains of creamy malodorous vaginal discharge for 2 weeks and BP check. Pt has a BP cuff but she does not use it.  Denies abnormal vaginal bleeding or significant pelvic pain or fever. No UTI symptoms. Denies history of known exposure to STD.  Patient's last menstrual period was 06/22/2019.  OBJECTIVE:  BP 111/68 P 80  She appears well, afebrile. Urine dipstick: not done.  ASSESSMENT:  Vaginal Discharge  Vaginal Odor   PLAN:  GC, chlamydia, trichomonas, BVAG, CVAG probe sent to lab. Treatment: To be determined once lab results are received and reviewed by the provider  ROV prn if symptoms persist or worsen.

## 2020-02-16 NOTE — Progress Notes (Signed)
Patient was assessed and managed by nursing staff during this encounter. I have reviewed the chart and agree with the documentation and plan. I have also made any necessary editorial changes.  Leeland Lovelady A Nekesha Font, MD 02/16/2020 10:31 AM   

## 2020-02-19 ENCOUNTER — Other Ambulatory Visit: Payer: Self-pay

## 2020-02-19 DIAGNOSIS — N898 Other specified noninflammatory disorders of vagina: Secondary | ICD-10-CM

## 2020-02-19 LAB — CERVICOVAGINAL ANCILLARY ONLY
Bacterial Vaginitis (gardnerella): NEGATIVE
Candida Glabrata: NEGATIVE
Candida Vaginitis: POSITIVE — AB
Chlamydia: NEGATIVE
Comment: NEGATIVE
Comment: NEGATIVE
Comment: NEGATIVE
Comment: NEGATIVE
Comment: NEGATIVE
Comment: NORMAL
Neisseria Gonorrhea: NEGATIVE
Trichomonas: NEGATIVE

## 2020-02-19 MED ORDER — TERCONAZOLE 0.8 % VA CREA: 1.0000 | TOPICAL_CREAM | Freq: Every day | VAGINAL | 0 refills | Status: DC

## 2020-02-19 NOTE — Progress Notes (Signed)
Rx sent as advised for vaginal yeast infection. Mychart message will be sent to pt.

## 2020-02-29 ENCOUNTER — Encounter: Payer: Self-pay | Admitting: Obstetrics and Gynecology

## 2020-02-29 ENCOUNTER — Other Ambulatory Visit: Payer: Self-pay

## 2020-02-29 ENCOUNTER — Other Ambulatory Visit (HOSPITAL_COMMUNITY)
Admission: RE | Admit: 2020-02-29 | Discharge: 2020-02-29 | Disposition: A | Discharge status: Home / Self Care | Payer: Medicaid Other | Source: Ambulatory Visit | Attending: Obstetrics and Gynecology | Admitting: Obstetrics and Gynecology

## 2020-02-29 ENCOUNTER — Ambulatory Visit (INDEPENDENT_AMBULATORY_CARE_PROVIDER_SITE_OTHER): Payer: Medicaid Other | Admitting: Obstetrics and Gynecology

## 2020-02-29 VITALS — BP 123/76 | HR 93 | Wt 174.0 lb

## 2020-02-29 DIAGNOSIS — Z3480 Encounter for supervision of other normal pregnancy, unspecified trimester: Secondary | ICD-10-CM

## 2020-02-29 NOTE — Progress Notes (Signed)
Pt presents for GBS/GC/CT labs.  Pt wants to know how much the baby weighs.

## 2020-02-29 NOTE — Progress Notes (Signed)
   PRENATAL VISIT NOTE  Subjective:  Cheyenne Phillips is a 30 y.o. [id number] at [redacted]w[redacted]d being seen today for ongoing prenatal care.  She is currently monitored for the following issues for this low-risk pregnancy and has History of cesarean delivery; Supervision of other normal pregnancy, antepartum; ASCUS with positive high risk HPV cervical; and Sickle cell trait (HCC) on their problem list.  Patient reports no complaints.  Contractions: Irritability. Vag. Bleeding: None.  Movement: Present. Denies leaking of fluid.   The following portions of the patient's history were reviewed and updated as appropriate: allergies, current medications, past family history, past medical history, past social history, past surgical history and problem list.   Objective:   Vitals:   02/29/20 0927  BP: 123/76  Pulse: 93  Weight: 174 lb (78.9 kg)    Fetal Status: Fetal Heart Rate (bpm): 138  Fundal Height: 36 cm Movement: Present  Presentation: Undeterminable  General:  Alert, oriented and cooperative. Patient is in no acute distress.  Skin: Skin is warm and dry. No rash noted.   Cardiovascular: Normal heart rate noted  Respiratory: Normal respiratory effort, no problems with respiration noted  Abdomen: Soft, gravid, appropriate for gestational age.  Pain/Pressure: Present     Pelvic: Cervical exam performed in the presence of a chaperone Dilation: Fingertip Effacement (%): 50 Station: Ballotable  Extremities: Normal range of motion.  Edema: Trace  Mental Status: Normal mood and affect. Normal behavior. Normal judgment and thought content.   Assessment and Plan:  Pregnancy: [id number] at [redacted]w[redacted]d  1. Supervision of other normal pregnancy, antepartum  - Cervicovaginal ancillary only - Culture, beta strep (group b only) - weekly appointments.     Preterm labor symptoms and general obstetric precautions including but not limited to vaginal bleeding, contractions, leaking of fluid and fetal movement were  reviewed in detail with the patient. Please refer to After Visit Summary for other counseling recommendations.   Return in about 1 week (around 03/07/2020).  No future appointments.  Venia Carbon, NP

## 2020-03-01 LAB — CERVICOVAGINAL ANCILLARY ONLY
Chlamydia: NEGATIVE
Comment: NEGATIVE
Comment: NORMAL
Neisseria Gonorrhea: NEGATIVE

## 2020-03-04 ENCOUNTER — Other Ambulatory Visit: Payer: Self-pay

## 2020-03-04 ENCOUNTER — Encounter (HOSPITAL_COMMUNITY): Payer: Self-pay | Admitting: Family Medicine

## 2020-03-04 ENCOUNTER — Inpatient Hospital Stay (HOSPITAL_COMMUNITY)
Admission: AD | Admit: 2020-03-04 | Discharge: 2020-03-04 | Disposition: A | Discharge status: Home / Self Care | Payer: Medicaid Other | Attending: Family Medicine | Admitting: Family Medicine

## 2020-03-04 DIAGNOSIS — N898 Other specified noninflammatory disorders of vagina: Secondary | ICD-10-CM

## 2020-03-04 DIAGNOSIS — B3731 Acute candidiasis of vulva and vagina: Secondary | ICD-10-CM

## 2020-03-04 DIAGNOSIS — Z3A36 36 weeks gestation of pregnancy: Secondary | ICD-10-CM

## 2020-03-04 DIAGNOSIS — O4703 False labor before 37 completed weeks of gestation, third trimester: Secondary | ICD-10-CM | POA: Diagnosis not present

## 2020-03-04 DIAGNOSIS — O479 False labor, unspecified: Secondary | ICD-10-CM

## 2020-03-04 DIAGNOSIS — B373 Candidiasis of vulva and vagina: Secondary | ICD-10-CM

## 2020-03-04 LAB — URINALYSIS, ROUTINE W REFLEX MICROSCOPIC
Bilirubin Urine: NEGATIVE
Glucose, UA: NEGATIVE mg/dL
Hgb urine dipstick: NEGATIVE
Ketones, ur: NEGATIVE mg/dL
Nitrite: NEGATIVE
Protein, ur: NEGATIVE mg/dL
Specific Gravity, Urine: 1.028 (ref 1.005–1.030)
pH: 6 (ref 5.0–8.0)

## 2020-03-04 LAB — WET PREP, GENITAL
Clue Cells Wet Prep HPF POC: NONE SEEN
Sperm: NONE SEEN
Trich, Wet Prep: NONE SEEN

## 2020-03-04 LAB — CULTURE, BETA STREP (GROUP B ONLY): Strep Gp B Culture: NEGATIVE

## 2020-03-04 MED ORDER — TERCONAZOLE 0.8 % VA CREA: 1.0000 | TOPICAL_CREAM | Freq: Every day | VAGINAL | 0 refills | Status: AC

## 2020-03-04 NOTE — MAU Note (Signed)
SAYS HER VAGINA HURTS AND BURNS .

## 2020-03-04 NOTE — Discharge Instructions (Signed)
Vaginal Yeast Infection, Adult  Vaginal yeast infection is a condition that causes vaginal discharge as well as soreness, swelling, and redness (inflammation) of the vagina. This is a common condition. Some women get this infection frequently. What are the causes? This condition is caused by a change in the normal balance of the yeast (candida) and bacteria that live in the vagina. This change causes an overgrowth of yeast, which causes the inflammation. What increases the risk? The condition is more likely to develop in women who:  Take antibiotic medicines.  Have diabetes.  Take birth control pills.  Are pregnant.  Douche often.  Have a weak body defense system (immune system).  Have been taking steroid medicines for a long time.  Frequently wear tight clothing. What are the signs or symptoms? Symptoms of this condition include:  White, thick, creamy vaginal discharge.  Swelling, itching, redness, and irritation of the vagina. The lips of the vagina (vulva) may be affected as well.  Pain or a burning feeling while urinating.  Pain during sex. How is this diagnosed? This condition is diagnosed based on:  Your medical history.  A physical exam.  A pelvic exam. Your health care provider will examine a sample of your vaginal discharge under a microscope. Your health care provider may send this sample for testing to confirm the diagnosis. How is this treated? This condition is treated with medicine. Medicines may be over-the-counter or prescription. You may be told to use one or more of the following:  Medicine that is taken by mouth (orally).  Medicine that is applied as a cream (topically).  Medicine that is inserted directly into the vagina (suppository). Follow these instructions at home: Lifestyle  Do not have sex until your health care provider approves. Tell your sex partner that you have a yeast infection. That person should go to his or her health care  provider and ask if they should also be treated.  Do not wear tight clothes, such as pantyhose or tight pants.  Wear breathable cotton underwear. General instructions  Take or apply over-the-counter and prescription medicines only as told by your health care provider.  Eat more yogurt. This may help to keep your yeast infection from returning.  Do not use tampons until your health care provider approves.  Try taking a sitz bath to help with discomfort. This is a warm water bath that is taken while you are sitting down. The water should only come up to your hips and should cover your buttocks. Do this 3-4 times per day or as told by your health care provider.  Do not douche.  If you have diabetes, keep your blood sugar levels under control.  Keep all follow-up visits as told by your health care provider. This is important.   Contact a health care provider if:  You have a fever.  Your symptoms go away and then return.  Your symptoms do not get better with treatment.  Your symptoms get worse.  You have new symptoms.  You develop blisters in or around your vagina.  You have blood coming from your vagina and it is not your menstrual period.  You develop pain in your abdomen. Summary  Vaginal yeast infection is a condition that causes discharge as well as soreness, swelling, and redness (inflammation) of the vagina.  This condition is treated with medicine. Medicines may be over-the-counter or prescription.  Take or apply over-the-counter and prescription medicines only as told by your health care provider.  Do not  douche. Do not have sex or use tampons until your health care provider approves.  Contact a health care provider if your symptoms do not get better with treatment or your symptoms go away and then return. This information is not intended to replace advice given to you by your health care provider. Make sure you discuss any questions you have with your health care  provider. Document Revised: 08/05/2018 Document Reviewed: 05/24/2017 Elsevier Patient Education  2021 Elsevier Inc.   Rosen's Emergency Medicine: Concepts and Clinical Practice (9th ed., pp. 2296- 2312). Elsevier.">  Braxton Hicks Contractions Contractions of the uterus can occur throughout pregnancy, but they are not always a sign that you are in labor. You may have practice contractions called Braxton Hicks contractions. These false labor contractions are sometimes confused with true labor. What are Deberah Pelton contractions? Braxton Hicks contractions are tightening movements that occur in the muscles of the uterus before labor. Unlike true labor contractions, these contractions do not result in opening (dilation) and thinning of the cervix. Toward the end of pregnancy (32-34 weeks), Braxton Hicks contractions can happen more often and may become stronger. These contractions are sometimes difficult to tell apart from true labor because they can be very uncomfortable. You should not feel embarrassed if you go to the hospital with false labor. Sometimes, the only way to tell if you are in true labor is for your health care provider to look for changes in the cervix. The health care provider will do a physical exam and may monitor your contractions. If you are not in true labor, the exam should show that your cervix is not dilating and your water has not broken. If there are no other health problems associated with your pregnancy, it is completely safe for you to be sent home with false labor. You may continue to have Braxton Hicks contractions until you go into true labor. How to tell the difference between true labor and false labor True labor  Contractions last 30-70 seconds.  Contractions become very regular.  Discomfort is usually felt in the top of the uterus, and it spreads to the lower abdomen and low back.  Contractions do not go away with walking.  Contractions usually become more  intense and increase in frequency.  The cervix dilates and gets thinner. False labor  Contractions are usually shorter and not as strong as true labor contractions.  Contractions are usually irregular.  Contractions are often felt in the front of the lower abdomen and in the groin.  Contractions may go away when you walk around or change positions while lying down.  Contractions get weaker and are shorter-lasting as time goes on.  The cervix usually does not dilate or become thin. Follow these instructions at home:  Take over-the-counter and prescription medicines only as told by your health care provider.  Keep up with your usual exercises and follow other instructions from your health care provider.  Eat and drink lightly if you think you are going into labor.  If Braxton Hicks contractions are making you uncomfortable: ? Change your position from lying down or resting to walking, or change from walking to resting. ? Sit and rest in a tub of warm water. ? Drink enough fluid to keep your urine pale yellow. Dehydration may cause these contractions. ? Do slow and deep breathing several times an hour.  Keep all follow-up prenatal visits as told by your health care provider. This is important.   Contact a health care provider if:  You have a fever.  You have continuous pain in your abdomen. Get help right away if:  Your contractions become stronger, more regular, and closer together.  You have fluid leaking or gushing from your vagina.  You pass blood-tinged mucus (bloody show).  You have bleeding from your vagina.  You have low back pain that you never had before.  You feel your babys head pushing down and causing pelvic pressure.  Your baby is not moving inside you as much as it used to. Summary  Contractions that occur before labor are called Braxton Hicks contractions, false labor, or practice contractions.  Braxton Hicks contractions are usually shorter,  weaker, farther apart, and less regular than true labor contractions. True labor contractions usually become progressively stronger and regular, and they become more frequent.  Manage discomfort from [facility] contractions by changing position, resting in a warm bath, drinking plenty of water, or practicing deep breathing. This information is not intended to replace advice given to you by your health care provider. Make sure you discuss any questions you have with your health care provider. Document Revised: 12/18/2016 Document Reviewed: 05/21/2016 Elsevier Patient Education  2021 Elsevier Inc.   Fetal Movement Counts Patient Name: ________________________________________________ Patient Due Date: ____________________  What is a fetal movement count? A fetal movement count is the number of times that you feel your baby move during a certain amount of time. This may also be called a fetal kick count. A fetal movement count is recommended for every pregnant woman. You may be asked to start counting fetal movements as early as week 28 of your pregnancy. Pay attention to when your baby is most active. You may notice your baby's sleep and wake cycles. You may also notice things that make your baby move more. You should do a fetal movement count:  When your baby is normally most active.  At the same time each day. A good time to count movements is while you are resting, after having something to eat and drink. How do I count fetal movements? 1. Find a quiet, comfortable area. Sit, or lie down on your side. 2. Write down the date, the start time and stop time, and the number of movements that you felt between those two times. Take this information with you to your health care visits. 3. Write down your start time when you feel the first movement. 4. Count kicks, flutters, swishes, rolls, and jabs. You should feel at least 10 movements. 5. You may stop counting after you have felt 10 movements,  or if you have been counting for 2 hours. Write down the stop time. 6. If you do not feel 10 movements in 2 hours, contact your health care provider for further instructions. Your health care provider may want to do additional tests to assess your baby's well-being. Contact a health care provider if:  You feel fewer than 10 movements in 2 hours.  Your baby is not moving like he or she usually does. Date: ____________ Start time: ____________ Stop time: ____________ Movements: ____________ Date: ____________ Start time: ____________ Stop time: ____________ Movements: ____________ Date: ____________ Start time: ____________ Stop time: ____________ Movements: ____________ Date: ____________ Start time: ____________ Stop time: ____________ Movements: ____________ Date: ____________ Start time: ____________ Stop time: ____________ Movements: ____________ Date: ____________ Start time: ____________ Stop time: ____________ Movements: ____________ Date: ____________ Start time: ____________ Stop time: ____________ Movements: ____________ Date: ____________ Start time: ____________ Stop time: ____________ Movements: ____________ Date: ____________ Start time: ____________ Stop  time: ____________ Movements: ____________ This information is not intended to replace advice given to you by your health care provider. Make sure you discuss any questions you have with your health care provider. Document Revised: 08/25/2018 Document Reviewed: 08/25/2018 Elsevier Patient Education  2021 [employer].

## 2020-03-04 NOTE — MAU Note (Signed)
PT SAYS SHE FEELS PELVIC PAIN - STARTED  AT 6.  PNC WITH [facility] .  VE ON Thursday - FT.  DENIES HSV AND MRSA.  GBS- NEG

## 2020-03-04 NOTE — MAU Provider Note (Signed)
S: Ms. RAYETTE MOGG is a 30 y.o. [id number] at [redacted]w[redacted]d  who presents to MAU today for labor evaluation.   Cervical exam by RN:  Dilation: 1 (1 external/ closed internal) Effacement (%): 50 Cervical Position: Posterior Station: Ballotable Exam by:: weston,rn  Fetal Monitoring: Baseline: 130 Variability: moderate Accelerations: multiple, reactive strip Decelerations: none Contractions: irregular  MDM Discussed patient with RN. NST reviewed and reactive. Given concern of vaginal pain, UA and wet prep were also collected (GC/C negative on 2/10). UA without evidence of infection and wet prep notable for yeast.  A: SIUP at [redacted]w[redacted]d  False labor  P: Discharge home. Script for topical antifungal given +yeast on wet prep. Labor precautions and kick counts included in AVS. Patient to follow-up with Venia Carbon, NP on 03/07/20 as scheduled  Patient may return to MAU as needed or when in labor   Sheila Oats, MD 03/04/2020 9:26 AM

## 2020-03-07 ENCOUNTER — Other Ambulatory Visit: Payer: Self-pay

## 2020-03-07 ENCOUNTER — Encounter: Payer: Self-pay | Admitting: Obstetrics and Gynecology

## 2020-03-07 ENCOUNTER — Ambulatory Visit (INDEPENDENT_AMBULATORY_CARE_PROVIDER_SITE_OTHER): Payer: Medicaid Other | Admitting: Obstetrics and Gynecology

## 2020-03-07 DIAGNOSIS — Z3480 Encounter for supervision of other normal pregnancy, unspecified trimester: Secondary | ICD-10-CM

## 2020-03-07 MED ORDER — FLUCONAZOLE 150 MG PO TABS: 150.0000 mg | ORAL_TABLET | Freq: Every day | ORAL | 0 refills | Status: DC

## 2020-03-07 MED ORDER — CYCLOBENZAPRINE HCL 10 MG PO TABS: 10.0000 mg | ORAL_TABLET | Freq: Three times a day (TID) | ORAL | 0 refills | Status: DC | PRN

## 2020-03-07 NOTE — Progress Notes (Signed)
ROB c/o pain 3/10 x 4 days.

## 2020-03-07 NOTE — Progress Notes (Signed)
   PRENATAL VISIT NOTE  Subjective:  Cheyenne Phillips is a 30 y.o. [id number] at [redacted]w[redacted]d being seen today for ongoing prenatal care.  She is currently monitored for the following issues for this low-risk pregnancy and has History of cesarean delivery; Supervision of other normal pregnancy, antepartum; ASCUS with positive high risk HPV cervical; and Sickle cell trait (HCC) on their problem list.  Patient reports pelvic pain.  Contractions: Irregular. Vag. Bleeding: None.  Movement: Present. Denies leaking of fluid.  Pelvic pain is constant. Unable to work or care for her child d/t pain. Feels like baby is moving down into the pelvis. Was seen in MAU this week.   The following portions of the patient's history were reviewed and updated as appropriate: allergies, current medications, past family history, past medical history, past social history, past surgical history and problem list.   Objective:   Vitals:   03/07/20 0920  BP: 120/66  Pulse: 91  Weight: 176 lb (79.8 kg)    Fetal Status: Fetal Heart Rate (bpm): 138 Fundal Height: 37 cm Movement: Present  Presentation: Undeterminable  General:  Alert, oriented and cooperative. Patient is in no acute distress.  Skin: Skin is warm and dry. No rash noted.   Cardiovascular: Normal heart rate noted  Respiratory: Normal respiratory effort, no problems with respiration noted  Abdomen: Soft, gravid, appropriate for gestational age.  Pain/Pressure: Present     Pelvic: Cervical exam performed in the presence of a chaperone Dilation: Fingertip Effacement (%): 50 Station: Ballotable  Extremities: Normal range of motion.  Edema: Trace  Mental Status: Normal mood and affect. Normal behavior. Normal judgment and thought content.   Assessment and Plan:  Pregnancy: [id number] at [redacted]w[redacted]d 1. Supervision of other normal pregnancy, antepartum  -Diagnosed with yeast infection in MAU; cream not available. Rx: Diflucan - Rx: Flexeril  - Return in 1 week. If pain  persist, would consider induction @ 39 weeks if cervix is favorable and MD ok d/t history of VBAC then vaginal delivery.   Term labor symptoms and general obstetric precautions including but not limited to vaginal bleeding, contractions, leaking of fluid and fetal movement were reviewed in detail with the patient. Please refer to After Visit Summary for other counseling recommendations.   Return in about 1 week (around 03/14/2020) for In person visit..  Future Appointments  Date Time Provider Department Center  03/14/2020  9:00 AM Constant, Gigi Gin, MD [facility] None  03/21/2020  9:15 AM Constant, Gigi Gin, MD [facility] None  03/28/2020  9:15 AM Conan Bowens, MD [facility] None    Venia Carbon, NP

## 2020-03-07 NOTE — Patient Instructions (Signed)
The [employer]  This circuit takes at least 90 minutes to complete so clear your schedule and make mental preparations so you can relax in your environment. The second step requires a lot of pillows so gather them up before beginning Before starting, you should empty your bladder! Have a nice drink nearby, and make sure it has a straw! If you are having contractions, this circuit should be done through contractions, try not to change positions between steps Before you begin...  "I named this 'circuit' after my friend Cheyenne Phillips, who shared and discussed it with me when I was working with a client whose labor seemed to be stalled out and no longer progressing... This circuit is useful to help get the baby lined up, ideally, in the "Left Occiput Anterior" (LOA) Position, both before labor begins and when some corrections need to be done during labor. Prenatally, this position set can help to rotate a baby. As a natural method of induction, this can help get things going if baby just needed a gentle nudge of position to set things off. To the best of my knowledge, this group of positions will not "hurt" a baby that is already lined up correctly." - Cheyenne Phillips   Step One: Open-knee Chest Stay in this position for 30 minutes, start in cat/cow, then drop your chest as low as you can to the bed or the floor and your bottom as high as you can. Knees should be fairly wide apart, and the angle between the torso/thighs should be wider than 90 degrees. Wiggle around, prop with lots of pillows and use this time to get totally relaxed. This position allows the baby to scoot out of the pelvis a bit and gives them room to rotate, shift their head position, etc. If the pregnant person finds it helpful, careful positioning with a rebozo under the belly, with gentle tension from a support person behind can help maintain this position for the full 30 minutes.  Step Two:Exaggerated Left Side  Lying Roll to your left side, bringing your top leg as high as possible and keeping your bottom leg straight. Roll forward as much as possible, again using a lot of pillows. Sink into the bed and relax some more. If you fall asleep, that's totally okay and you can stay there! If not, stay here for at least another half an hour. Try and get your top right leg up towards your head and get as rolled over onto your belly as much as possible. If you repeat the circuit during labor, try alternating left and right sides. We know the photo the left is actually right side... just flip the image in your head.  Step Three: Moving and Lunges Lunge, walk stairs facing sideways, 2 at a time, (have a spotter downstairs of you!), take a walk outside with one foot on the curb and the other on the street, sit on a birth ball and hula- anything that's upright and putting your pelvis in open, asymmetrical positions. Spend at least 30 minutes doing this one as well to give your baby a chance to move down. If you are lunging or stair or curb walking, you should lunge/walk/go up stairs in the direction that feels better to you. The key with the lunge is that the toes of the higher leg and mom's belly button should be at right angles. Do not lunge over your knee, that closes the pelvis.     Cheyenne Phillips: Circuit Creator - [email or web address] Cheyenne   Phillips, CD, BDT (DONA), LCCE, FACCE: Supporting Content - [email or web address] Emily Weaver Brown: Photography - [email or web address] Kate Dewey CD/CDT (BAI): Print and Webmaster - [email or web address] [employer] [email or web address]  

## 2020-03-14 ENCOUNTER — Ambulatory Visit (INDEPENDENT_AMBULATORY_CARE_PROVIDER_SITE_OTHER): Payer: Medicaid Other | Admitting: Obstetrics and Gynecology

## 2020-03-14 ENCOUNTER — Encounter: Payer: Self-pay | Admitting: Obstetrics and Gynecology

## 2020-03-14 ENCOUNTER — Other Ambulatory Visit: Payer: Self-pay

## 2020-03-14 VITALS — BP 116/76 | HR 79 | Wt 177.0 lb

## 2020-03-14 DIAGNOSIS — Z98891 History of uterine scar from previous surgery: Secondary | ICD-10-CM

## 2020-03-14 DIAGNOSIS — Z3480 Encounter for supervision of other normal pregnancy, unspecified trimester: Secondary | ICD-10-CM

## 2020-03-14 NOTE — Progress Notes (Signed)
ROB 38 wks  CC: none   Pt declines cervix check

## 2020-03-14 NOTE — Progress Notes (Signed)
   PRENATAL VISIT NOTE  Subjective:  Cheyenne Phillips is a 30 y.o. [id number] at [redacted]w[redacted]d being seen today for ongoing prenatal care.  She is currently monitored for the following issues for this low-risk pregnancy and has History of cesarean delivery; Supervision of other normal pregnancy, antepartum; ASCUS with positive high risk HPV cervical; and Sickle cell trait (HCC) on their problem list.  Patient reports backache.  Contractions: Irritability. Vag. Bleeding: None.  Movement: Present. Denies leaking of fluid.   The following portions of the patient's history were reviewed and updated as appropriate: allergies, current medications, past family history, past medical history, past social history, past surgical history and problem list.   Objective:   Vitals:   03/14/20 0912  BP: 116/76  Pulse: 79  Weight: 177 lb (80.3 kg)    Fetal Status: Fetal Heart Rate (bpm): 134 Fundal Height: 37 cm Movement: Present     General:  Alert, oriented and cooperative. Patient is in no acute distress.  Skin: Skin is warm and dry. No rash noted.   Cardiovascular: Normal heart rate noted  Respiratory: Normal respiratory effort, no problems with respiration noted  Abdomen: Soft, gravid, appropriate for gestational age.  Pain/Pressure: Present     Pelvic: Cervical exam performed in the presence of a chaperone Dilation: Fingertip Effacement (%): 50 Station: Ballotable  Extremities: Normal range of motion.  Edema: Trace  Mental Status: Normal mood and affect. Normal behavior. Normal judgment and thought content.   Assessment and Plan:  Pregnancy: [id number] at [redacted]w[redacted]d 1. Supervision of other normal pregnancy, antepartum Patient is doing well with same third trimester complaints as previous No cervical change. Patient is a poor candidate for elective induction of labor  2. History of cesarean delivery Desires VBAC  Term labor symptoms and general obstetric precautions including but not limited to vaginal  bleeding, contractions, leaking of fluid and fetal movement were reviewed in detail with the patient. Please refer to After Visit Summary for other counseling recommendations.   Return in about 1 week (around 03/21/2020) for in person, ROB, Low risk.  Future Appointments  Date Time Provider Department Center  03/21/2020  9:15 AM Simmie Camerer, Gigi Gin, MD [facility] None  03/28/2020  9:15 AM Conan Bowens, MD [facility] None    Catalina Antigua, MD

## 2020-03-21 ENCOUNTER — Other Ambulatory Visit: Payer: Self-pay

## 2020-03-21 ENCOUNTER — Encounter: Payer: Self-pay | Admitting: Obstetrics and Gynecology

## 2020-03-21 ENCOUNTER — Ambulatory Visit (INDEPENDENT_AMBULATORY_CARE_PROVIDER_SITE_OTHER): Payer: Medicaid Other | Admitting: Obstetrics and Gynecology

## 2020-03-21 ENCOUNTER — Telehealth (HOSPITAL_COMMUNITY): Payer: Self-pay | Admitting: *Deleted

## 2020-03-21 ENCOUNTER — Encounter (HOSPITAL_COMMUNITY): Payer: Self-pay | Admitting: *Deleted

## 2020-03-21 VITALS — BP 111/72 | HR 80 | Wt 178.0 lb

## 2020-03-21 DIAGNOSIS — Z3480 Encounter for supervision of other normal pregnancy, unspecified trimester: Secondary | ICD-10-CM

## 2020-03-21 DIAGNOSIS — Z98891 History of uterine scar from previous surgery: Secondary | ICD-10-CM

## 2020-03-21 NOTE — Progress Notes (Signed)
Subjective:  Cheyenne Phillips is a 30 y.o. G4W1027 at [redacted]w[redacted]d being seen today for ongoing prenatal care.  She is currently monitored for the following issues for this low-risk pregnancy and has History of cesarean delivery; Supervision of other normal pregnancy, antepartum; ASCUS with positive high risk HPV cervical; and Sickle cell trait (HCC) on their problem list.  Patient reports general discomforts of pregnancy.  Contractions: Irregular. Vag. Bleeding: None.  Movement: Present. Denies leaking of fluid.   The following portions of the patient's history were reviewed and updated as appropriate: allergies, current medications, past family history, past medical history, past social history, past surgical history and problem list. Problem list updated.  Objective:   Vitals:   03/21/20 0921  BP: 111/72  Pulse: 80  Weight: 178 lb (80.7 kg)    Fetal Status: Fetal Heart Rate (bpm): 140   Movement: Present     General:  Alert, oriented and cooperative. Patient is in no acute distress.  Skin: Skin is warm and dry. No rash noted.   Cardiovascular: Normal heart rate noted  Respiratory: Normal respiratory effort, no problems with respiration noted  Abdomen: Soft, gravid, appropriate for gestational age. Pain/Pressure: Present     Pelvic:  Cervical exam performed        Extremities: Normal range of motion.  Edema: Trace  Mental Status: Normal mood and affect. Normal behavior. Normal judgment and thought content.   Urinalysis:      Assessment and Plan:  Pregnancy: [id number] at [redacted]w[redacted]d  1. Supervision of other normal pregnancy, antepartum Labor Precautions IOL scheduled for 41 weeks  2. History of cesarean delivery For TOLAC, consented  Term labor symptoms and general obstetric precautions including but not limited to vaginal bleeding, contractions, leaking of fluid and fetal movement were reviewed in detail with the patient. Please refer to After Visit Summary for other counseling  recommendations.  Return in about 1 week (around 03/28/2020) for OB visit, face to face, MD only.   Hermina Staggers, MD

## 2020-03-21 NOTE — Telephone Encounter (Signed)
Preadmission screen  

## 2020-03-21 NOTE — Patient Instructions (Signed)

## 2020-03-22 ENCOUNTER — Inpatient Hospital Stay (HOSPITAL_COMMUNITY)
Admission: AD | Admit: 2020-03-22 | Discharge: 2020-03-24 | DRG: 807 | Disposition: A | Discharge status: Home / Self Care | Payer: Medicaid Other | Attending: Obstetrics and Gynecology | Admitting: Obstetrics and Gynecology

## 2020-03-22 ENCOUNTER — Encounter (HOSPITAL_COMMUNITY): Payer: Self-pay | Admitting: Obstetrics and Gynecology

## 2020-03-22 DIAGNOSIS — O34219 Maternal care for unspecified type scar from previous cesarean delivery: Secondary | ICD-10-CM | POA: Diagnosis present

## 2020-03-22 DIAGNOSIS — O480 Post-term pregnancy: Secondary | ICD-10-CM | POA: Diagnosis present

## 2020-03-22 DIAGNOSIS — Z98891 History of uterine scar from previous surgery: Secondary | ICD-10-CM

## 2020-03-22 DIAGNOSIS — D573 Sickle-cell trait: Secondary | ICD-10-CM | POA: Diagnosis present

## 2020-03-22 DIAGNOSIS — Z87891 Personal history of nicotine dependence: Secondary | ICD-10-CM

## 2020-03-22 DIAGNOSIS — Z3A39 39 weeks gestation of pregnancy: Secondary | ICD-10-CM

## 2020-03-22 DIAGNOSIS — O9902 Anemia complicating childbirth: Secondary | ICD-10-CM | POA: Diagnosis present

## 2020-03-22 DIAGNOSIS — Z3480 Encounter for supervision of other normal pregnancy, unspecified trimester: Secondary | ICD-10-CM

## 2020-03-22 MED ORDER — LACTATED RINGERS IV SOLN: 500.0000 mL | INTRAVENOUS | Status: DC | PRN

## 2020-03-22 NOTE — H&P (Addendum)
LABOR AND DELIVERY ADMISSION HISTORY AND PHYSICAL NOTE  Cheyenne Phillips is a 30 y.o. female G5O0370 with IUP at [redacted]w[redacted]d by LMP + earlyu U/S presenting for SOL with considerable vaginal bleeding.   She reports positive fetal movement. She denies leakage of fluid.  Reports that she had a cervical exam performed yesterday and today around 1 PM she started having vaginal bleeding.  Denies any intercourse or falls.  Reports that initially the bleeding was spotting but it continued to pick up.  She also notices worsening strong contractions that are getting closer together.  Prenatal History/Complications: Hx of CS followed by successful VBAC.  ASCUS with high risk HPV on PAP  Sickle cell trait  Past Medical History: Past Medical History:  Diagnosis Date   Medical history non-contributory    Seasonal allergies    Sickle cell trait (HCC)     Past Surgical History: Past Surgical History:  Procedure Laterality Date   CESAREAN SECTION N/A 05/31/2013   Procedure: CESAREAN SECTION;  Surgeon: Brock Bad, MD;  Location: [facility];  Service: Obstetrics;  Laterality: N/A;   WISDOM TOOTH EXTRACTION      Obstetrical History: OB History    Gravida  5   Para  2   Term  2   Preterm      AB  2   Living  2     SAB      IAB  2   Ectopic      Multiple  0   Live Births  2           Social History: Social History   Socioeconomic History   Marital status: Single    Spouse name: Not on file   Number of children: 2   Years of education: Not on file   Highest education level: Not on file  Occupational History   Not on file  Tobacco Use   Smoking status: Former Smoker    Packs/day: 1.00   Smokeless tobacco: Never Used  Building services engineer Use: Never used  Substance and Sexual Activity   Alcohol use: Not Currently    Alcohol/week: 0.0 standard drinks   Drug use: Yes   Sexual activity: Yes    Partners: Male    Birth control/protection: None     Comment: Pregnant   Other Topics Concern   Not on file  Social History Narrative   Not on file   Social Determinants of Health   Financial Resource Strain: Not on file  Food Insecurity: Not on file  Transportation Needs: Not on file  Physical Activity: Not on file  Stress: Not on file  Social Connections: Not on file    Family History: Family History  Problem Relation Age of Onset   Hypertension Maternal Grandmother    Hyperlipidemia Maternal Grandmother    Arthritis Maternal Grandmother    Asthma Maternal Grandmother     Allergies: No Known Allergies  Medications Prior to Admission  Medication Sig Dispense Refill Last Dose   cyclobenzaprine (FLEXERIL) 10 MG tablet Take 1 tablet (10 mg total) by mouth every 8 (eight) hours as needed for muscle spasms. 30 tablet 0 Past Month at Unknown time   Prenatal Vit-Fe Fumarate-FA (PRENATAL MULTIVITAMIN) TABS tablet Take 1 tablet by mouth daily at 12 noon.   03/22/2020 at Unknown time     Review of Systems   All systems reviewed and negative except as stated in HPI  Blood pressure (!) 110/56, pulse 73, resp.  rate 16, height 5\' 8"  (1.727 m), last menstrual period 06/22/2019, SpO2 99 %. General appearance: alert, cooperative and moderate distress Lungs: clear to auscultation bilaterally Heart: regular rate and rhythm Abdomen: soft, non-tender; bowel sounds normal Extremities: No calf swelling or tenderness Presentation: cephalic Fetal monitoring: Category 1, reassuring, positive A cells, no decelerations Uterine activity: Every 2-3 minutes Dilation: 1.5 Effacement (%): 80 Station: -2 Exam by:: Dr. [email or web address]  Moderate vaginal bleeding with clots    Prenatal labs: ABO, Rh: --/--/O POS (03/05 0005) Antibody: NEG (03/05 0005) Rubella: 1.95 (08/23 1135) RPR: Non Reactive (12/15 0944)  HBsAg: Negative (08/23 1135)  HIV: Non Reactive (12/15 0944)  GBS: Negative/-- (02/10 0950)  1 hr Glucola: Normal  Genetic screening:   Negative  Anatomy 02-24-2002: Normal   Prenatal Transfer Tool  Maternal Diabetes: No Genetic Screening: Normal Maternal Ultrasounds/Referrals: Normal Fetal Ultrasounds or other Referrals:  None Maternal Substance Abuse:  No Significant Maternal Medications:  None Significant Maternal Lab Results: Group B Strep negative  Results for orders placed or performed during the hospital encounter of 03/22/20 (from the past 24 hour(s))  CBC   Collection Time: 03/23/20 12:05 AM  Result Value Ref Range   WBC 11.6 (H) 4.0 - 10.5 K/uL   RBC 4.10 3.87 - 5.11 MIL/uL   Hemoglobin 10.9 (L) 12.0 - 15.0 g/dL   HCT 05/23/20 (L) 18.2 - 99.3 %   MCV 73.7 (L) 80.0 - 100.0 fL   MCH 26.6 26.0 - 34.0 pg   MCHC 36.1 (H) 30.0 - 36.0 g/dL   RDW 71.6 (H) 96.7 - 89.3 %   Platelets 163 150 - 400 K/uL   nRBC 0.0 0.0 - 0.2 %  Type and screen   Collection Time: 03/23/20 12:05 AM  Result Value Ref Range   ABO/RH(D) O POS    Antibody Screen NEG    Sample Expiration      03/26/2020,2359 Performed at [facility], [address] [place], [place]     Patient Active Problem List   Diagnosis Date Noted   Post-dates pregnancy 03/23/2020   Sickle cell trait (HCC) 10/09/2019   ASCUS with positive high risk HPV cervical 09/18/2019   Supervision of other normal pregnancy, antepartum 08/11/2019   History of cesarean delivery 11/09/2018    Assessment: Cheyenne Phillips is a 30 y.o. 34 at [redacted]w[redacted]d here for early SOL with vaginal bleeding.   #TOLAC/Labor: Patient is regularly contracting, due to the fact that she has a past C-section Cytotec is not an option for cervical ripening.  As long as baby continues to appear well we will consider Foley balloon to help with dilation if labor is not progressing.  We will recheck cervix soon.  Counseled regarding TOLAC vs RCS; risks/benefits discussed in detail. All questions answered.  Patient elects for TOLAC.   #Pain: Early epidural #FWB: Category 1,  reassuring #ID:  GBS Negative  #MOF: Breast and formula  #MOC:Undecided  #Circ:  Yes #Vaginal bleeding: Unclear etiology at this time, may be bloody show, possibly abruption. Continue to monitor closely.  #History of ASCUS with positive high risk HPV: Colposcopy after delivery. [redacted]w[redacted]d 03/23/2020, 2:16 AM   I saw and evaluated the patient. I agree with the findings and the plan of care as documented in the residents note. I have made any necessary edits above.   05/23/2020, MD [facility] Family Medicine Fellow, [facility] for [facility], [place], [facility]

## 2020-03-22 NOTE — MAU Note (Signed)
EMS arrival. Pt reports ctxs started last night . Now stronger and closer together. Bloody show but no leaking. Good fetal movement felt.

## 2020-03-23 ENCOUNTER — Encounter (HOSPITAL_COMMUNITY): Payer: Self-pay | Admitting: Obstetrics and Gynecology

## 2020-03-23 ENCOUNTER — Inpatient Hospital Stay (HOSPITAL_COMMUNITY): Payer: Medicaid Other | Admitting: Anesthesiology

## 2020-03-23 ENCOUNTER — Other Ambulatory Visit: Payer: Self-pay

## 2020-03-23 DIAGNOSIS — Z87891 Personal history of nicotine dependence: Secondary | ICD-10-CM | POA: Diagnosis not present

## 2020-03-23 DIAGNOSIS — Z3A39 39 weeks gestation of pregnancy: Secondary | ICD-10-CM | POA: Diagnosis not present

## 2020-03-23 DIAGNOSIS — D573 Sickle-cell trait: Secondary | ICD-10-CM | POA: Diagnosis present

## 2020-03-23 DIAGNOSIS — O480 Post-term pregnancy: Secondary | ICD-10-CM | POA: Diagnosis present

## 2020-03-23 DIAGNOSIS — O34219 Maternal care for unspecified type scar from previous cesarean delivery: Secondary | ICD-10-CM | POA: Diagnosis present

## 2020-03-23 DIAGNOSIS — O34211 Maternal care for low transverse scar from previous cesarean delivery: Secondary | ICD-10-CM | POA: Diagnosis not present

## 2020-03-23 DIAGNOSIS — O9902 Anemia complicating childbirth: Secondary | ICD-10-CM | POA: Diagnosis present

## 2020-03-23 LAB — CBC
HCT: 28.7 % — ABNORMAL LOW (ref 36.0–46.0)
HCT: 30.2 % — ABNORMAL LOW (ref 36.0–46.0)
Hemoglobin: 10.2 g/dL — ABNORMAL LOW (ref 12.0–15.0)
Hemoglobin: 10.9 g/dL — ABNORMAL LOW (ref 12.0–15.0)
MCH: 26.5 pg (ref 26.0–34.0)
MCH: 26.6 pg (ref 26.0–34.0)
MCHC: 35.5 g/dL (ref 30.0–36.0)
MCHC: 36.1 g/dL — ABNORMAL HIGH (ref 30.0–36.0)
MCV: 73.7 fL — ABNORMAL LOW (ref 80.0–100.0)
MCV: 74.5 fL — ABNORMAL LOW (ref 80.0–100.0)
Platelets: 157 10*3/uL (ref 150–400)
Platelets: 163 10*3/uL (ref 150–400)
RBC: 3.85 MIL/uL — ABNORMAL LOW (ref 3.87–5.11)
RBC: 4.1 MIL/uL (ref 3.87–5.11)
RDW: 15.9 % — ABNORMAL HIGH (ref 11.5–15.5)
RDW: 16 % — ABNORMAL HIGH (ref 11.5–15.5)
WBC: 11.6 10*3/uL — ABNORMAL HIGH (ref 4.0–10.5)
WBC: 13.7 10*3/uL — ABNORMAL HIGH (ref 4.0–10.5)
nRBC: 0 % (ref 0.0–0.2)
nRBC: 0 % (ref 0.0–0.2)

## 2020-03-23 LAB — TYPE AND SCREEN
ABO/RH(D): O POS
Antibody Screen: NEGATIVE

## 2020-03-23 LAB — RPR: RPR Ser Ql: NONREACTIVE

## 2020-03-23 MED ORDER — DIBUCAINE (PERIANAL) 1 % EX OINT: 1.0000 "application " | TOPICAL_OINTMENT | CUTANEOUS | Status: DC | PRN

## 2020-03-23 MED ORDER — SOD CITRATE-CITRIC ACID 500-334 MG/5ML PO SOLN: 30.0000 mL | ORAL | Status: DC | PRN

## 2020-03-23 MED ORDER — OXYTOCIN-SODIUM CHLORIDE 30-0.9 UT/500ML-% IV SOLN
2.5000 [IU]/h | INTRAVENOUS | Status: DC
  Administered 2020-03-23: 2.5 [IU]/h via INTRAVENOUS
  Filled 2020-03-23: qty 500

## 2020-03-23 MED ORDER — ONDANSETRON HCL 4 MG/2ML IJ SOLN
4.0000 mg | Freq: Four times a day (QID) | INTRAMUSCULAR | Status: DC | PRN
  Administered 2020-03-23: 4 mg via INTRAVENOUS
  Filled 2020-03-23: qty 2

## 2020-03-23 MED ORDER — ACETAMINOPHEN 325 MG PO TABS: 650.0000 mg | ORAL_TABLET | ORAL | Status: DC | PRN

## 2020-03-23 MED ORDER — OXYCODONE-ACETAMINOPHEN 5-325 MG PO TABS: 2.0000 | ORAL_TABLET | ORAL | Status: DC | PRN

## 2020-03-23 MED ORDER — ONDANSETRON HCL 4 MG/2ML IJ SOLN: 4.0000 mg | INTRAMUSCULAR | Status: DC | PRN

## 2020-03-23 MED ORDER — SIMETHICONE 80 MG PO CHEW: 80.0000 mg | CHEWABLE_TABLET | ORAL | Status: DC | PRN

## 2020-03-23 MED ORDER — COCONUT OIL OIL: 1.0000 "application " | TOPICAL_OIL | Status: DC | PRN

## 2020-03-23 MED ORDER — BENZOCAINE-MENTHOL 20-0.5 % EX AERO
1.0000 "application " | INHALATION_SPRAY | CUTANEOUS | Status: DC | PRN
  Administered 2020-03-23: 1 via TOPICAL
  Filled 2020-03-23: qty 56

## 2020-03-23 MED ORDER — WITCH HAZEL-GLYCERIN EX PADS: 1.0000 "application " | MEDICATED_PAD | CUTANEOUS | Status: DC | PRN

## 2020-03-23 MED ORDER — IBUPROFEN 600 MG PO TABS
600.0000 mg | ORAL_TABLET | Freq: Four times a day (QID) | ORAL | Status: DC
  Administered 2020-03-23 – 2020-03-24 (×5): 600 mg via ORAL
  Filled 2020-03-23 (×5): qty 1

## 2020-03-23 MED ORDER — DIPHENHYDRAMINE HCL 25 MG PO CAPS
25.0000 mg | ORAL_CAPSULE | Freq: Four times a day (QID) | ORAL | Status: DC | PRN
Start: 2020-03-23 — End: 2020-03-24

## 2020-03-23 MED ORDER — LIDOCAINE HCL (PF) 1 % IJ SOLN: 30.0000 mL | INTRAMUSCULAR | Status: DC | PRN

## 2020-03-23 MED ORDER — LACTATED RINGERS IV SOLN: INTRAVENOUS | Status: DC

## 2020-03-23 MED ORDER — FENTANYL CITRATE (PF) 100 MCG/2ML IJ SOLN
50.0000 ug | INTRAMUSCULAR | Status: DC | PRN
  Administered 2020-03-23: 100 ug via INTRAVENOUS
  Filled 2020-03-23: qty 2

## 2020-03-23 MED ORDER — TETANUS-DIPHTH-ACELL PERTUSSIS 5-2.5-18.5 LF-MCG/0.5 IM SUSY: 0.5000 mL | PREFILLED_SYRINGE | Freq: Once | INTRAMUSCULAR | Status: DC

## 2020-03-23 MED ORDER — FENTANYL-BUPIVACAINE-NACL 0.5-0.125-0.9 MG/250ML-% EP SOLN
EPIDURAL | Status: DC | PRN
  Administered 2020-03-23: 12 mL/h via EPIDURAL

## 2020-03-23 MED ORDER — PHENYLEPHRINE 40 MCG/ML (10ML) SYRINGE FOR IV PUSH (FOR BLOOD PRESSURE SUPPORT)
PREFILLED_SYRINGE | INTRAVENOUS | Status: AC
  Filled 2020-03-23: qty 10

## 2020-03-23 MED ORDER — PRENATAL MULTIVITAMIN CH
1.0000 | ORAL_TABLET | Freq: Every day | ORAL | Status: DC
  Administered 2020-03-23 – 2020-03-24 (×2): 1 via ORAL
  Filled 2020-03-23 (×2): qty 1

## 2020-03-23 MED ORDER — LIDOCAINE HCL (PF) 1 % IJ SOLN
INTRAMUSCULAR | Status: DC | PRN
  Administered 2020-03-23: 3 mL via EPIDURAL
  Administered 2020-03-23: 7 mL via EPIDURAL

## 2020-03-23 MED ORDER — ZOLPIDEM TARTRATE 5 MG PO TABS: 5.0000 mg | ORAL_TABLET | Freq: Every evening | ORAL | Status: DC | PRN

## 2020-03-23 MED ORDER — SENNOSIDES-DOCUSATE SODIUM 8.6-50 MG PO TABS
2.0000 | ORAL_TABLET | Freq: Every day | ORAL | Status: DC
  Administered 2020-03-24: 2 via ORAL
  Filled 2020-03-23: qty 2

## 2020-03-23 MED ORDER — ONDANSETRON HCL 4 MG PO TABS: 4.0000 mg | ORAL_TABLET | ORAL | Status: DC | PRN

## 2020-03-23 MED ORDER — ACETAMINOPHEN 325 MG PO TABS
650.0000 mg | ORAL_TABLET | ORAL | Status: DC | PRN
  Administered 2020-03-24: 650 mg via ORAL
  Filled 2020-03-23: qty 2

## 2020-03-23 MED ORDER — OXYCODONE-ACETAMINOPHEN 5-325 MG PO TABS
1.0000 | ORAL_TABLET | ORAL | Status: DC | PRN
Start: 2020-03-23 — End: 2020-03-23

## 2020-03-23 MED ORDER — FENTANYL-BUPIVACAINE-NACL 0.5-0.125-0.9 MG/250ML-% EP SOLN
EPIDURAL | Status: AC
  Filled 2020-03-23: qty 250

## 2020-03-23 MED ORDER — OXYTOCIN BOLUS FROM INFUSION
333.0000 mL | Freq: Once | INTRAVENOUS | Status: AC
  Administered 2020-03-23: 333 mL via INTRAVENOUS

## 2020-03-23 NOTE — Anesthesia Procedure Notes (Signed)
Epidural Patient location during procedure: OB Start time: 03/23/2020 1:18 AM End time: 03/23/2020 1:31 AM  Staffing Anesthesiologist: Lucretia Kern, MD Performed: anesthesiologist   Preanesthetic Checklist Completed: patient identified, IV checked, risks and benefits discussed, monitors and equipment checked, pre-op evaluation and timeout performed  Epidural Patient position: sitting Prep: DuraPrep Patient monitoring: heart rate, continuous pulse ox and blood pressure Approach: midline Location: L3-L4 Injection technique: LOR air  Needle:  Needle type: Tuohy  Needle gauge: 17 G Needle length: 9 cm Needle insertion depth: 7 cm Catheter type: closed end flexible Catheter size: 19 Gauge Catheter at skin depth: 12 cm Test dose: negative  Assessment Events: blood not aspirated, injection not painful, no injection resistance, no paresthesia and negative IV test  Additional Notes Reason for block:procedure for pain

## 2020-03-23 NOTE — Anesthesia Postprocedure Evaluation (Signed)
Anesthesia Post Note  Patient: Cheyenne Phillips  Procedure(s) Performed: AN AD HOC LABOR EPIDURAL     Patient location during evaluation: Mother Baby Anesthesia Type: Epidural Level of consciousness: awake and alert, oriented and patient cooperative Pain management: pain level controlled Vital Signs Assessment: post-procedure vital signs reviewed and stable Respiratory status: spontaneous breathing Cardiovascular status: stable Postop Assessment: no headache, epidural receding, patient able to bend at knees and no signs of nausea or vomiting Anesthetic complications: no   No complications documented.  Last Vitals:  Vitals:   03/23/20 0530 03/23/20 0615  BP: 108/61 (!) 116/97  Pulse: 71 66  Resp:  18  Temp:  36.6 C  SpO2:  98%    Last Pain:  Vitals:   03/23/20 0615  TempSrc: Oral  PainSc: 0-No pain   Pain Goal: Patients Stated Pain Goal: 0 (03/23/20 0040)                 Merrilyn Puma

## 2020-03-23 NOTE — Anesthesia Preprocedure Evaluation (Signed)
Anesthesia Evaluation  Patient identified by MRN, date of birth, ID band Patient awake    Reviewed: Allergy & Precautions, H&P , NPO status , Patient's Chart, lab work & pertinent test results  History of Anesthesia Complications Negative for: history of anesthetic complications  Airway Mallampati: II  TM Distance: >3 FB Neck ROM: full    Dental no notable dental hx.    Pulmonary neg pulmonary ROS, former smoker,    Pulmonary exam normal        Cardiovascular negative cardio ROS Normal cardiovascular exam Rhythm:regular Rate:Normal     Neuro/Psych negative neurological ROS  negative psych ROS   GI/Hepatic negative GI ROS, Neg liver ROS,   Endo/Other  negative endocrine ROS  Renal/GU negative Renal ROS  negative genitourinary   Musculoskeletal   Abdominal   Peds  Hematology negative hematology ROS (+)   Anesthesia Other Findings   Reproductive/Obstetrics (+) Pregnancy                             Anesthesia Physical Anesthesia Plan  ASA: II  Anesthesia Plan: Epidural   Post-op Pain Management:    Induction:   PONV Risk Score and Plan:   Airway Management Planned:   Additional Equipment:   Intra-op Plan:   Post-operative Plan:   Informed Consent: I have reviewed the patients History and Physical, chart, labs and discussed the procedure including the risks, benefits and alternatives for the proposed anesthesia with the patient or authorized representative who has indicated his/her understanding and acceptance.       Plan Discussed with:   Anesthesia Plan Comments:         Anesthesia Quick Evaluation  

## 2020-03-23 NOTE — Discharge Summary (Addendum)
Postpartum Discharge Summary     Patient Name: Cheyenne Phillips DOB: February 07, 1990 MRN: [id number]  Date of admission: 03/22/2020 Delivery date:03/23/2020  Delivering provider: Janet Berlin  Date of discharge: 03/24/2020  Admitting diagnosis: Post-dates pregnancy [O48.0] Intrauterine pregnancy: [redacted]w[redacted]d     Secondary diagnosis:  Active Problems:   Post-dates pregnancy  Additional problems: None     Discharge diagnosis: Term Pregnancy Delivered and VBAC                                              Post partum procedures:N/A Augmentation: N/A Complications: None  Hospital course: Onset of Labor With Vaginal Delivery      30 y.o. yo [id number] at [redacted]w[redacted]d was admitted in Latent Labor on 03/22/2020. She was noted to have greater than typical vaginal bleeding. Upon further monitoring she made rapid cervical change from 1cm to 10 cm within approx 4 hours. Patient had an uncomplicated labor course as follows:  Membrane Rupture Time/Date: 3:13 AM ,03/23/2020   Delivery Method:VBAC, Spontaneous  Episiotomy: None  Lacerations:  None  Patient had an normal and uncomplicated postpartum course.  She is ambulating, tolerating a regular diet, passing flatus, and urinating well. Patient is discharged home in stable condition on 03/24/20.  Newborn Data: Birth date:03/23/2020  Birth time:4:11 AM  Gender:Female  Living status:Living  Apgars:8 ,9  Weight:3359 g   Magnesium Sulfate received: No BMZ received: No Rhophylac:N/A MMR:N/A T-DaP:Given prenatally Flu: No Transfusion:No  Physical exam  Vitals:   03/23/20 1438 03/23/20 1744 03/23/20 2116 03/24/20 0510  BP: 110/72 112/72 115/65 111/69  Pulse: 69 80 80 70  Resp: $Remo'16 17 18 18  'Cxwiy$ Temp: 97.7 F (36.5 C) 97.8 F (36.6 C) 97.7 F (36.5 C) 97.6 F (36.4 C)  TempSrc: Oral Axillary Oral Oral  SpO2: 99% 99% 100% 100%  Height:       General: alert, cooperative and no distress Lochia: appropriate Uterine Fundus: firm Incision: N/A DVT  Evaluation: No evidence of DVT seen on physical exam. Labs: Lab Results  Component Value Date   WBC 13.7 (H) 03/23/2020   HGB 10.2 (L) 03/23/2020   HCT 28.7 (L) 03/23/2020   MCV 74.5 (L) 03/23/2020   PLT 157 03/23/2020   CMP Latest Ref Rng & Units 05/05/2018  Glucose 70 - 99 mg/dL 92  BUN 6 - 20 mg/dL 11  Creatinine 0.44 - 1.00 mg/dL 0.72  Sodium 135 - 145 mmol/L 136  Potassium 3.5 - 5.1 mmol/L 3.7  Chloride 98 - 111 mmol/L 107  CO2 22 - 32 mmol/L 21(L)  Calcium 8.9 - 10.3 mg/dL 8.8(L)  Total Protein 6.0 - 8.3 g/dL -  Total Bilirubin 0.3 - 1.2 mg/dL -  Alkaline Phos 39 - 117 U/L -  AST 0 - 37 U/L -  ALT 0 - 35 U/L -   Edinburgh Score: Edinburgh Postnatal Depression Scale Screening Tool 03/23/2020  I have been able to laugh and see the funny side of things. 0  I have looked forward with enjoyment to things. 0  I have blamed myself unnecessarily when things went wrong. 1  I have been anxious or worried for no good reason. 1  I have felt scared or panicky for no good reason. 2  Things have been getting on top of me. 0  I have been so unhappy that I have had difficulty  sleeping. 0  I have felt sad or miserable. 0  I have been so unhappy that I have been crying. 0  The thought of harming myself has occurred to me. 0  Edinburgh Postnatal Depression Scale Total 4     After visit meds:  Allergies as of 03/24/2020   No Known Allergies     Medication List    STOP taking these medications   cyclobenzaprine 10 MG tablet Commonly known as: FLEXERIL     TAKE these medications   prenatal multivitamin Tabs tablet Take 1 tablet by mouth daily at 12 noon.        Discharge home in stable condition Infant Feeding: Bottle and Breast Infant Disposition:home with mother Discharge instruction: per After Visit Summary and Postpartum booklet. Activity: Advance as tolerated. Pelvic rest for 6 weeks.  Diet: routine diet Future Appointments: Future Appointments  Date Time Provider  [place]  03/28/2020  9:15 AM Sloan Leiter, MD [place] None  04/02/2020 10:00 AM [facility] None   Follow up Visit:  [place] Follow up.   Why: In 4 weeks for a postpartum appointment Contact information: [address] [place] [address] [phone]               Please schedule this patient for a In person postpartum visit in 6 weeks with the following provider: Any provider. Additional Postpartum F/U:[place]  Low risk pregnancy complicated by: hx prior cs Delivery mode:  VBAC, Spontaneous  Anticipated Birth Control:  Unsure   03/24/2020 Wende Mott, CNM

## 2020-03-23 NOTE — Lactation Note (Signed)
This note was copied from a baby's chart. Lactation Consultation Note Baby less than hr old is all ready BF. Baby is on the breast BF well. Mom didn't BF her other children.  Mom plans on BF and giving formula. Mom's other 2 children are 5 & 28 yrs old.  Encouraged  To ask questions if needed. Will be f/u on MBU.  Patient Name: Cheyenne Phillips Date: 03/23/2020 Reason for consult: L&D Initial assessment;Term Age:84 hours  Maternal Data Does the patient have breastfeeding experience prior to this delivery?: No  Feeding    LATCH Score Latch: Grasps breast easily, tongue down, lips flanged, rhythmical sucking.  Audible Swallowing: None  Type of Nipple: Everted at rest and after stimulation  Comfort (Breast/Nipple): Soft / non-tender  Hold (Positioning): Assistance needed to correctly position infant at breast and maintain latch.  LATCH Score: 7   Lactation Tools Discussed/Used    Interventions Interventions: Breast feeding basics reviewed  Discharge    Consult Status Consult Status: Follow-up Date: 03/23/20 Follow-up type: In-patient    Cleatus Gabriel, Diamond Nickel 03/23/2020, 5:09 AM

## 2020-03-23 NOTE — Progress Notes (Signed)
Patient called out to nurse for IV alarm. RN to bedside to adjust monitor and patient stated she was feeling pain. Patient and RN agreed to change positions to alleviate pain. When the peanut ball was removed RN noticed baby crowing. MD called to bedside @ 0408 patient involuntarily pushing. MD to bedside @ 0410.

## 2020-03-23 NOTE — Progress Notes (Signed)
LABOR PROGRESS NOTE  Cheyenne Phillips is a 30 y.o. [id number] at [redacted]w[redacted]d  admitted for SOL.   Subjective: Patient is considerably more comfortable with epidural.  No concerns at this time  Objective: BP (!) 110/56   Pulse 73   Resp 16   Ht 5\' 8"  (1.727 m)   LMP 06/22/2019   SpO2 99%   BMI 27.06 kg/m  or  Vitals:   03/23/20 0151 03/23/20 0154 03/23/20 0155 03/23/20 0200  BP: (!) 112/51  (!) 111/52 (!) 110/56  Pulse: 76  76 73  Resp: 16  16   SpO2:  99%    Height:        Less bleeding on cervical exam than previous checks.  Dilation: 4.5 Effacement (%): 70 Cervical Position: Posterior Station: -1 Presentation: Vertex Exam by:: Dr. [email or web address] FHT: baseline rate 130, moderate varibility, + acel, - decel Toco: 2-3 min  Labs: Lab Results  Component Value Date   WBC 11.6 (H) 03/23/2020   HGB 10.9 (L) 03/23/2020   HCT 30.2 (L) 03/23/2020   MCV 73.7 (L) 03/23/2020   PLT 163 03/23/2020    Patient Active Problem List   Diagnosis Date Noted  . Post-dates pregnancy 03/23/2020  . Sickle cell trait (HCC) 10/09/2019  . 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 Supervision of other normal pregnancy, antepartum 08/11/2019  . History of cesarean delivery 11/09/2018    Assessment / Plan: 30 y.o. 34 at [redacted]w[redacted]d here for SOL who had considerable vaginal bleeding initially.  Labor: Labor is progressing well without any augmentation at this time.  She is dilating and her cervix is standing out.  We will continue to monitor for now and consider augmentation with Pitocin/AROM at next cervical check if no change made. Fetal Wellbeing: Category 1, reassuring Pain Control: Epidural in place Anticipated MOD: Vaginal delivery  [redacted]w[redacted]d, MD  PGY-2, [facility]  03/23/2020, 2:29 AM 1

## 2020-03-24 DIAGNOSIS — O34219 Maternal care for unspecified type scar from previous cesarean delivery: Secondary | ICD-10-CM

## 2020-03-24 MED ORDER — MEDROXYPROGESTERONE ACETATE 150 MG/ML IM SUSP
150.0000 mg | Freq: Once | INTRAMUSCULAR | Status: AC
  Administered 2020-03-24: 150 mg via INTRAMUSCULAR
  Filled 2020-03-24: qty 1

## 2020-03-24 NOTE — Discharge Instructions (Signed)

## 2020-03-28 ENCOUNTER — Encounter: Payer: Medicaid Other | Admitting: Obstetrics and Gynecology

## 2020-04-02 ENCOUNTER — Other Ambulatory Visit (HOSPITAL_COMMUNITY): Payer: Medicaid Other

## 2020-04-04 ENCOUNTER — Inpatient Hospital Stay (HOSPITAL_COMMUNITY)
Admission: AD | Admit: 2020-04-04 | Payer: Medicaid Other | Source: Home / Self Care | Admitting: Obstetrics and Gynecology

## 2020-04-04 ENCOUNTER — Inpatient Hospital Stay (HOSPITAL_COMMUNITY): Payer: Medicaid Other

## 2020-05-06 ENCOUNTER — Other Ambulatory Visit: Payer: Self-pay

## 2020-05-06 ENCOUNTER — Other Ambulatory Visit (HOSPITAL_COMMUNITY)
Admission: RE | Admit: 2020-05-06 | Discharge: 2020-05-06 | Disposition: A | Discharge status: Home / Self Care | Payer: BC Managed Care – PPO | Source: Ambulatory Visit | Attending: Obstetrics and Gynecology | Admitting: Obstetrics and Gynecology

## 2020-05-06 ENCOUNTER — Ambulatory Visit (INDEPENDENT_AMBULATORY_CARE_PROVIDER_SITE_OTHER): Payer: BC Managed Care – PPO | Admitting: Obstetrics and Gynecology

## 2020-05-06 ENCOUNTER — Encounter: Payer: Self-pay | Admitting: Obstetrics and Gynecology

## 2020-05-06 VITALS — BP 115/76 | HR 88 | Wt 163.5 lb

## 2020-05-06 DIAGNOSIS — R87610 Atypical squamous cells of undetermined significance on cytologic smear of cervix (ASC-US): Secondary | ICD-10-CM | POA: Diagnosis not present

## 2020-05-06 DIAGNOSIS — R87810 Cervical high risk human papillomavirus (HPV) DNA test positive: Secondary | ICD-10-CM | POA: Diagnosis not present

## 2020-05-06 NOTE — Progress Notes (Signed)
Patient given informed consent, signed copy in the chart, time out was performed.  Placed in lithotomy position. UPT negative.  Previous pap ASCUS with high risk HPV.  Cervix viewed with speculum and colposcope after application of acetic acid.   Colposcopy adequate?  yes Acetowhite lesions? yes Punctation? no Mosaicism?  no Abnormal vasculature?  no Biopsies? Yes at 7 oclock ECC? no  COMMENTS:  Patient was given post procedure instructions.  Pt advised no intercourse for 2 weeks.  Will relay results with mychart  Warden Fillers, MD

## 2020-05-06 NOTE — Patient Instructions (Signed)
HPV and Cancer Information HPV (human papillomavirus)is a very common virus that spreads easily from person to person through skin-to-skin or sexual contact. There are many types of HPV. It often does not cause symptoms. However, depending upon the type, it may sometimes cause warts in the genitals (genital or mucosal HPV), or on the hands or feet (cutaneous or nonmucosal HPV). It is possible to be infected for a long time and pass HPV to others without knowing it. Some HPV infections go away on their own within 2 years, but other HPV infections are considered high-risk and may cause changes in cells that could lead to cancer. You can take steps to avoid HPV infection and to lower your risk of getting cancer. How can HPV affect me? HPV can cause warts in the genitals or on the hands or feet. It can also cause wart-like lesions in the throat.  Certain types of genital HPV can also cause cancer, which may include:  Cervical cancer.  Vaginal cancer.  Vulvar cancer.  Anal cancer.  Throat cancer.  Tongue or mouth cancer.  Penile cancer. How does HPV spread? HPV spreads easily through direct person to person contact. Genital HPV spreads through sexual contact. You can get HPV from vaginal sex, oral sex, anal sex, or just by touching someone's genitals. Even people who have only one sexual partner may have HPV because that partner may have it. HPV often does not cause symptoms, so most infected people do not know that they have it. What actions can I take to prevent HPV? Take the following steps to help prevent HPV infection:  Talk with your health care provider about getting the HPV vaccine. This vaccine protects against the types of HPV that could cause cancer.  Limit the number of people you have sex with. Also, avoid having sex with people who have had many sexual partners.  Use a condom during sex.  Talk with your sexual partners about their health.   What actions can I take to lower my  risk for cancer? Having a healthy lifestyle and taking some preventive steps can help lower your cancer risk, whether or not you have genital HPV. Some steps you can take include: Lifestyle  Practice safe sex to help prevent HPV infection.  Do not use any products that contain nicotine or tobacco, such as cigarettes, e-cigarettes, and chewing tobacco. If you need help quitting, ask your health care provider.  Eat foods that have antioxidants, such as fruits, vegetables, and grains. Try to eat at least 5 servings of fruits and vegetables every day.  Get regular exercise.  Lose weight if you are overweight.  Practice good oral hygiene. This includes flossing and brushing your teeth every day. Other preventive steps  Get the HPV vaccine as told by your health care provider.  Get tested for STIs even if you do not have symptoms of HPV. You may have HPV and not know it.  If you are a woman, get regular Pap and HPV tests. Talk with your health care provider about how often you need these tests. Pap tests will help identify changes in cells that can lead to cancer. HPV tests will help identify the presence of HPV in cells in the cervix. Where to find more information Learn more about HPV and cancer from:  Centers for Disease Control and Prevention: www.cdc.gov/hpv  National Cancer Institute: www.cancer.gov  American Cancer Society: www.cancer.org Contact a health care provider if:  You have genital warts.  You are sexually   active and think you may have HPV.  You did not protect yourself during sex and would like to be tested for STIs. Summary  Human papillomavirus (HPV) is a very common virus that spreads easily from person to person and ishighly contagious.  Certain types of genital HPV are considered to be high risk and may cause changes in cells that could lead to cancer.  You should take steps to avoid HPV infection, such as limiting the number of people you have sex with,  using condoms during sex, and getting the HPV vaccine.  Lifestyle changes can help lower your risk of cancer. These include eating a healthy diet, getting regular exercise, and not using any products that contain nicotine or tobacco.  You may have HPV and not know it. Get tested for STIs even if you do not have symptoms of HPV. If you are a woman, have regular Pap tests and HPV tests as directed by your health care provider. This information is not intended to replace advice given to you by your health care provider. Make sure you discuss any questions you have with your health care provider. Document Revised: 08/22/2019 Document Reviewed: 08/22/2019 Elsevier Patient Education  2021 Elsevier Inc. Colposcopy, Care After This sheet gives you information about how to care for yourself after your procedure. Your doctor may also give you more specific instructions. If you have problems or questions, contact your doctor. What can I expect after the procedure? If you did not have a sample of your tissue taken out (did not have a biopsy), you may only have some spotting of blood for a few days. You can go back to your normal activities. If you had a sample of your tissue taken out, it is common to have:  Soreness and mild pain. These may last for a few days.  A light-headed feeling.  Mild bleeding or fluid (discharge) coming from your vagina. The fluid will look dark and grainy. You may have this for a few days. The fluid may be caused by a liquid that was used during your procedure. You may need to wear a sanitary pad.  Spotting of blood for at least 48 hours after the procedure. Follow these instructions at home: Medicines  Take over-the-counter and prescription medicines only as told by your doctor.  Ask your doctor what medicines you can start taking again. This is very important if you take blood thinners. Activity  Limit your activity for the first day after your procedure as told by your  doctor.  For at least 3 days, or for as long as told by your doctor, avoid: ? Douching. ? Using tampons. ? Having sex.  Return to your normal activities as told by your doctor. Ask your doctor what activities are safe for you. General instructions  Drink enough fluid to keep your pee (urine) pale yellow.  Ask your doctor if you may take baths, swim, or use a hot tub. You may take showers.  If you use birth control (contraception), keep using it.  Keep all follow-up visits as told by your doctor. This is important.   Contact a doctor if:  You get a skin rash. Get help right away if:  You bleed a lot from your vagina. A lot of bleeding means you use more than one pad an hour for 2 hours in a row.  You have clumps of blood (blood clots) coming from your vagina.  You have a fever or chills.  You have signs of  infection. This may be fluid coming from your vagina that is: ? Different than normal. ? Yellow. ? Bad-smelling.  You have very bad pain or cramps in your lower belly that do not get better with medicine.  You faint. Summary  If you did not have a sample of your tissue taken out, you may only have some spotting of blood for a few days. You can go back to your normal activities.  If you had a sample of your tissue taken out, it is common to have mild pain for a few days and spotting for 48 hours.  Avoid douching, using tampons, and having sex for at least 3 days after the procedure or for as long as told.  Get help right away if you have a lot of bleeding, very bad pain, or signs of infection. This information is not intended to replace advice given to you by your health care provider. Make sure you discuss any questions you have with your health care provider. Document Revised: 11/07/2019 Document Reviewed: 01/04/2019 Elsevier Patient Education  2021 [employer].

## 2020-05-06 NOTE — Addendum Note (Signed)
Addended by: Cheree Ditto, Jourdan Durbin A on: 05/06/2020 03:01 PM   Modules accepted: Orders

## 2020-05-06 NOTE — Progress Notes (Signed)
    Post Partum Visit Note  Cheyenne Phillips is a 30 y.o. [id number] female who presents for a postpartum visit. She is 6 weeks ostpartum following a vaginal  Delivery. I have fully reviewed the prenatal and intrapartum course. The delivery was at 39 gestational weeks.  Anesthesia: epidual. Postpartum course has been uncomplicated. Baby is doing well. Baby is feeding by breast/bottle. Bleeding not at this time. Bowel function is normal. Bladder function is normal. Patient is sexually active. Contraception method is depo-provera. Postpartum depression screening: negative   The pregnancy intention screening data noted above was reviewed. Potential methods of contraception were discussed. The patient elected to proceed with Hormonal Injection.     There are no preventive care reminders to display for this patient.  The following portions of the patient's history were reviewed and updated as appropriate: allergies, current medications, past family history, past medical history, past social history, past surgical history and problem list.  Review of Systems Pertinent items are noted in HPI.  Objective:  BP 115/76   Pulse 88   Wt 163 lb 8 oz (74.2 kg)   LMP 06/22/2019   BMI 24.86 kg/m    General:  alert, cooperative and no distress   Breasts:  not indicated  Lungs: clear to auscultation bilaterally  Heart:  regular rate and rhythm  Abdomen: soft, non-tender; bowel sounds normal; no masses,  no organomegaly   Wound n/a  GU exam:  normal       Assessment:    There are no diagnoses linked to this encounter.  normal postpartum exam.   Plan:   Essential components of care per ACOG recommendations:  1.  Mood and well being: Patient with negative depression screening today. Reviewed local resources for support.  - Patient does not use tobacco. - hx of drug use? No  2. Infant care and feeding:  -Patient currently breastmilk feeding? No  -Social determinants of health (SDOH)  reviewed in EPIC. No concerns  3. Sexuality, contraception and birth spacing - Patient does not want a pregnancy in the next year.  Desired family size is 3 children.  - Reviewed forms of contraception in tiered fashion. Patient desired depo-provera today.   - Discussed birth spacing of 18 months  4. Sleep and fatigue -Encouraged family/partner/community support of 4 hrs of uninterrupted sleep to help with mood and fatigue  5. Physical Recovery  - Discussed patients delivery and complications - Patient had a Vaginal, no problems at delivery. Perineal healing reviewed. Patient expressed understanding - Patient has urinary incontinence? No  - Patient  safe to resume physical and sexual activity  6.  Health Maintenance - HM due items addressed Yes - Last pap smear done 09/11/2019 and was positive  HPV. Pap smear not done at today's visit.  See colpo note   Warden Fillers, MD Center for [facility], [facility] GroupDepo provera in the hospital Discuss hernia

## 2020-05-08 ENCOUNTER — Telehealth: Payer: Medicaid Other | Admitting: Obstetrics & Gynecology

## 2020-05-08 ENCOUNTER — Telehealth: Payer: Self-pay | Admitting: Obstetrics & Gynecology

## 2020-05-08 DIAGNOSIS — K429 Umbilical hernia without obstruction or gangrene: Secondary | ICD-10-CM

## 2020-05-08 LAB — SURGICAL PATHOLOGY

## 2020-05-08 NOTE — Telephone Encounter (Signed)
     OB/GYN Physician Phone Call Documentation  I had a phone conversation with Kiahna P Bookbinder about her umbilical hernia.  This occurred during her recent pregnancy, and is still persistent now about 7 weeks after pregnancy.  No pain, nausea or vomiting noted but it is very visible and patient wants it repaired.  Patient was informed that this can happen during pregnancy and can reduce in size and get better as her body heals months after pregnancy, therefore not needing intervention.  She reports that it is about 1 inch in width and is very bothersome, she was told she will be referred to General Surgery during her six week postpartum visit but this was not done.  She wants to be seen by a surgical specialist.  Referral sent to [facility], she was told to expect a call from them about further evaluation and management.   Jaynie Collins, MD, FACOG Obstetrician & Gynecologist, [facility] for [employer], [facility]

## 2020-06-21 ENCOUNTER — Ambulatory Visit: Payer: Self-pay | Admitting: Surgery

## 2020-06-21 NOTE — H&P (Signed)
History of Present Illness Cheyenne Phillips. Angelette Ganus MD; 06/21/2020 12:58 PM) The patient is a 30 year old female who presents with an umbilical hernia. Referred by Dr. Macon Large for umbilical hernia  This is a 30 year old female who is 3 months postpartum who developed a fairly large protruding umbilical hernia when she was pregnant. Since she delivered, the hernia is slightly smaller but remains fairly uncomfortable. He continues to protrude. She is planning on going back to work eventually on her job requires heavy lifting. She is referred to [place] to consider repair of the hernia. She denies any obstructive symptoms.     Past Surgical History Arbutus Ped, CMA; 06/21/2020 11:08 AM) Cesarean Section - 1 Oral Surgery  Allergies Arbutus Ped, CMA; 06/21/2020 11:09 AM) No Known Drug Allergies [06/21/2020]: Allergies Reconciled  Medication History Arbutus Ped, CMA; 06/21/2020 11:09 AM) No Current Medications Medications Reconciled  Social History Arbutus Ped, CMA; 06/21/2020 11:08 AM) Caffeine use Carbonated beverages, Tea. No drug use Tobacco use Current some day smoker.  Family History Arbutus Ped, CMA; 06/21/2020 11:08 AM) Family history unknown First Degree Relatives  Pregnancy / Birth History Arbutus Ped, CMA; 06/21/2020 11:08 AM) Rhett Bannister 5 Maternal age 45-20 Para 3 Regular periods  Other Problems Arbutus Ped, CMA; 06/21/2020 11:08 AM) Umbilical Hernia Repair     Review of Systems Arbutus Ped CMA; 06/21/2020 11:09 AM) General Not Present- Appetite Loss, Chills, Fatigue, Fever, Night Sweats, Weight Gain and Weight Loss. Skin Not Present- Change in Wart/Mole, Dryness, Hives, Jaundice, New Lesions, Non-Healing Wounds, Rash and Ulcer. HEENT Present- Seasonal Allergies. Not Present- Earache, Hearing Loss, Hoarseness, Nose Bleed, Oral Ulcers, Ringing in the Ears, Sinus Pain, Sore Throat, Visual Disturbances, Wears glasses/contact lenses and Yellow Eyes. Respiratory Not Present-  Bloody sputum, Chronic Cough, Difficulty Breathing, Snoring and Wheezing. Cardiovascular Not Present- Chest Pain, Difficulty Breathing Lying Down, Leg Cramps, Palpitations, Rapid Heart Rate, Shortness of Breath and Swelling of Extremities. Gastrointestinal Not Present- Abdominal Pain, Bloating, Bloody Stool, Change in Bowel Habits, Chronic diarrhea, Constipation, Difficulty Swallowing, Excessive gas, Gets full quickly at meals, Hemorrhoids, Indigestion, Nausea, Rectal Pain and Vomiting. Female Genitourinary Not Present- Frequency, Nocturia, Painful Urination, Pelvic Pain and Urgency. Musculoskeletal Not Present- Back Pain, Joint Pain, Joint Stiffness, Muscle Pain, Muscle Weakness and Swelling of Extremities. Neurological Not Present- Decreased Memory, Fainting, Headaches, Numbness, Seizures, Tingling, Tremor, Trouble walking and Weakness. Psychiatric Not Present- Anxiety, Bipolar, Change in Sleep Pattern, Depression, Fearful and Frequent crying.  Vitals Arbutus Ped CMA; 06/21/2020 11:09 AM) 06/21/2020 11:09 AM Weight: 172.5 lb Height: 66in Body Surface Area: 1.88 m Body Mass Index: 27.84 kg/m  Temp.: 97.38F  Pulse: 109 (Regular)  P.OX: 99% (Room air) BP: 100/60(Sitting, Left Arm, Standard)        Physical Exam Molli Hazard K. Henleigh Robello MD; 06/21/2020 12:59 PM)  The physical exam findings are as follows: Note:Constitutional: WDWN in NAD, conversant, no obvious deformities; resting comfortably Eyes: Pupils equal, round; sclera anicteric; moist conjunctiva; no lid lag HENT: Oral mucosa moist; good dentition Neck: No masses palpated, trachea midline; no thyromegaly Lungs: CTA bilaterally; normal respiratory effort CV: Regular rate and rhythm; no murmurs; extremities well-perfused with no edema Abd: +bowel sounds, soft, non-tender, no palpable organomegaly; small protruding umbilical hernia at right lower edge of umbilicus - reducible; sub centimeter defect Musc: Normal gait; no apparent  clubbing or cyanosis in extremities Lymphatic: No palpable cervical or axillary lymphadenopathy Skin: Warm, dry; no sign of jaundice Psychiatric - alert and oriented x 4; calm mood and affect  Assessment & Plan Cheyenne Phillips. Salome Hautala MD; 06/21/2020 12:59 PM)  UMBILICAL HERNIA WITHOUT OBSTRUCTION OR GANGRENE (K42.9)  Current Plans Schedule for Surgery - Umbilical hernia repair. The surgical procedure has been discussed with the patient. Potential risks, benefits, alternative treatments, and expected outcomes have been explained. All of the patient's questions at this time have been answered. The likelihood of reaching the patient's treatment goal is good. The patient understand the proposed surgical procedure and wishes to proceed. Note:This defect is likely too small to use mesh, so we will proceed with primary repair.  Cheyenne Phillips. Corliss Skains, MD, [facility]  General/ Trauma Surgery   06/21/2020 1:00 PM

## 2020-06-24 ENCOUNTER — Encounter: Payer: Self-pay | Admitting: Radiology

## 2020-07-01 ENCOUNTER — Ambulatory Visit: Payer: Self-pay | Admitting: Surgery

## 2020-07-03 ENCOUNTER — Other Ambulatory Visit: Payer: Self-pay

## 2020-07-03 MED ORDER — MEDROXYPROGESTERONE ACETATE 150 MG/ML IM SUSP: 150.0000 mg | INTRAMUSCULAR | 0 refills | Status: DC

## 2020-07-03 NOTE — Telephone Encounter (Signed)
Refills on depo

## 2020-07-04 ENCOUNTER — Other Ambulatory Visit (HOSPITAL_COMMUNITY)
Admission: RE | Admit: 2020-07-04 | Discharge: 2020-07-04 | Disposition: A | Discharge status: Home / Self Care | Payer: BC Managed Care – PPO | Source: Ambulatory Visit | Attending: Obstetrics & Gynecology | Admitting: Obstetrics & Gynecology

## 2020-07-04 ENCOUNTER — Other Ambulatory Visit: Payer: Self-pay

## 2020-07-04 ENCOUNTER — Ambulatory Visit (INDEPENDENT_AMBULATORY_CARE_PROVIDER_SITE_OTHER): Payer: Medicaid Other

## 2020-07-04 DIAGNOSIS — N898 Other specified noninflammatory disorders of vagina: Secondary | ICD-10-CM | POA: Diagnosis not present

## 2020-07-04 DIAGNOSIS — Z3042 Encounter for surveillance of injectable contraceptive: Secondary | ICD-10-CM | POA: Diagnosis not present

## 2020-07-04 DIAGNOSIS — Z113 Encounter for screening for infections with a predominantly sexual mode of transmission: Secondary | ICD-10-CM

## 2020-07-04 LAB — POCT URINE PREGNANCY: Preg Test, Ur: NEGATIVE

## 2020-07-04 MED ORDER — MEDROXYPROGESTERONE ACETATE 150 MG/ML IM SUSP
150.0000 mg | Freq: Once | INTRAMUSCULAR | Status: AC
  Administered 2020-07-04: 150 mg via INTRAMUSCULAR

## 2020-07-04 NOTE — Progress Notes (Signed)
DEPO INJECTION Subjective:  Pt in for office supply Depo Provera injection and all STD testing.     Objective: Need for contraception. No unusual complaints.    Assessment: UPT negative. Depo given Right upper outer quadrant. Patient tolerated injection well.    Plan:  Next injection due Sept 1-15, pt agrees.     SELF SWAB SUBJECTIVE:  30 y.o. female complains of white vaginal discharge. Denies abnormal vaginal bleeding or significant pelvic pain or fever. No UTI symptoms. Denies history of known exposure to STD.  OBJECTIVE:  She appears well, afebrile. Urine dipstick: not done.  ASSESSMENT:  Vaginal Discharge    PLAN:  GC, chlamydia, trichomonas, BVAG, CVAG probe sent to lab. Treatment: To be determined once lab results are reviewed by the MD ROV prn if symptoms persist or worsen.

## 2020-07-05 LAB — CERVICOVAGINAL ANCILLARY ONLY
Bacterial Vaginitis (gardnerella): POSITIVE — AB
Candida Glabrata: NEGATIVE
Candida Vaginitis: NEGATIVE
Chlamydia: NEGATIVE
Comment: NEGATIVE
Comment: NEGATIVE
Comment: NEGATIVE
Comment: NEGATIVE
Comment: NEGATIVE
Comment: NORMAL
Neisseria Gonorrhea: NEGATIVE
Trichomonas: NEGATIVE

## 2020-07-05 LAB — HIV ANTIBODY (ROUTINE TESTING W REFLEX): HIV Screen 4th Generation wRfx: NONREACTIVE

## 2020-07-05 LAB — RPR: RPR Ser Ql: NONREACTIVE

## 2020-07-05 LAB — HEPATITIS B SURFACE ANTIGEN: Hepatitis B Surface Ag: NEGATIVE

## 2020-07-05 LAB — HEPATITIS C ANTIBODY: Hep C Virus Ab: <0.1 s/co ratio (ref 0.0–0.9)

## 2020-07-08 ENCOUNTER — Other Ambulatory Visit: Payer: Self-pay | Admitting: Obstetrics & Gynecology

## 2020-07-08 DIAGNOSIS — N898 Other specified noninflammatory disorders of vagina: Secondary | ICD-10-CM

## 2020-07-08 MED ORDER — METRONIDAZOLE 500 MG PO TABS: 500.0000 mg | ORAL_TABLET | Freq: Two times a day (BID) | ORAL | 0 refills | Status: AC

## 2020-07-08 NOTE — Progress Notes (Signed)
Meds ordered this encounter  Medications  . metroNIDAZOLE (FLAGYL) 500 MG tablet    Sig: Take 1 tablet (500 mg total) by mouth 2 (two) times daily for 7 days.    Dispense:  14 tablet    Refill:  0    

## 2020-07-23 ENCOUNTER — Encounter: Payer: Self-pay | Admitting: Obstetrics and Gynecology

## 2020-08-30 ENCOUNTER — Other Ambulatory Visit: Payer: Self-pay

## 2020-08-30 MED ORDER — METRONIDAZOLE 500 MG PO TABS: 500.0000 mg | ORAL_TABLET | Freq: Two times a day (BID) | ORAL | 0 refills | Status: DC

## 2020-09-04 ENCOUNTER — Other Ambulatory Visit: Payer: Self-pay | Admitting: Surgery

## 2020-09-19 ENCOUNTER — Other Ambulatory Visit: Payer: Self-pay

## 2020-09-19 ENCOUNTER — Other Ambulatory Visit (HOSPITAL_COMMUNITY)
Admission: RE | Admit: 2020-09-19 | Discharge: 2020-09-19 | Disposition: A | Discharge status: Home / Self Care | Payer: Medicaid Other | Source: Ambulatory Visit | Attending: Obstetrics and Gynecology | Admitting: Obstetrics and Gynecology

## 2020-09-19 ENCOUNTER — Other Ambulatory Visit: Payer: Self-pay | Admitting: Obstetrics

## 2020-09-19 ENCOUNTER — Ambulatory Visit (INDEPENDENT_AMBULATORY_CARE_PROVIDER_SITE_OTHER): Payer: Medicaid Other

## 2020-09-19 VITALS — BP 116/76 | HR 82

## 2020-09-19 DIAGNOSIS — Z3042 Encounter for surveillance of injectable contraceptive: Secondary | ICD-10-CM | POA: Diagnosis not present

## 2020-09-19 MED ORDER — MEDROXYPROGESTERONE ACETATE 150 MG/ML IM SUSP
150.0000 mg | Freq: Once | INTRAMUSCULAR | Status: AC
  Administered 2020-09-19: 150 mg via INTRAMUSCULAR

## 2020-09-19 NOTE — Progress Notes (Signed)
Patient presented to the office today for her depo-provera injection.  She is on time for depo-provera injection. Given by: D.Fouad Taul NDC# [id number] Route: R.Deltoid Side Effects: None  HCG Serum: None  SUBJECTIVE:  30 y.o. female complains of white vaginal discharge for a couple of days. Denies abnormal vaginal bleeding or significant pelvic pain or fever. No UTI symptoms. Denies history of known exposure to STD.  No LMP recorded.  OBJECTIVE:  She appears well, afebrile. Urine dipstick: not done.  ASSESSMENT:  Vaginal Discharge: none  Vaginal Odor: none  PLAN:  GC, chlamydia, trichomonas, BVAG, CVAG probe sent to lab. Treatment: To be determined once lab results are received ROV prn if symptoms persist or worsen.

## 2020-09-20 ENCOUNTER — Other Ambulatory Visit: Payer: Self-pay | Admitting: Obstetrics

## 2020-09-20 DIAGNOSIS — B3731 Acute candidiasis of vulva and vagina: Secondary | ICD-10-CM

## 2020-09-20 DIAGNOSIS — B373 Candidiasis of vulva and vagina: Secondary | ICD-10-CM

## 2020-09-20 LAB — CERVICOVAGINAL ANCILLARY ONLY
Bacterial Vaginitis (gardnerella): POSITIVE — AB
Candida Glabrata: NEGATIVE
Candida Vaginitis: POSITIVE — AB
Chlamydia: NEGATIVE
Comment: NEGATIVE
Comment: NEGATIVE
Comment: NEGATIVE
Comment: NEGATIVE
Comment: NEGATIVE
Comment: NORMAL
Neisseria Gonorrhea: NEGATIVE
Trichomonas: NEGATIVE

## 2020-09-20 MED ORDER — FLUCONAZOLE 150 MG PO TABS: 150.0000 mg | ORAL_TABLET | Freq: Once | ORAL | 0 refills | Status: AC

## 2020-12-09 ENCOUNTER — Ambulatory Visit: Payer: Medicaid Other

## 2020-12-11 ENCOUNTER — Other Ambulatory Visit: Payer: Self-pay

## 2020-12-11 ENCOUNTER — Other Ambulatory Visit: Payer: Self-pay | Admitting: *Deleted

## 2020-12-11 ENCOUNTER — Other Ambulatory Visit (HOSPITAL_COMMUNITY)
Admission: RE | Admit: 2020-12-11 | Discharge: 2020-12-11 | Disposition: A | Discharge status: Home / Self Care | Payer: Medicaid Other | Source: Ambulatory Visit | Attending: Obstetrics and Gynecology | Admitting: Obstetrics and Gynecology

## 2020-12-11 ENCOUNTER — Ambulatory Visit (INDEPENDENT_AMBULATORY_CARE_PROVIDER_SITE_OTHER): Payer: Medicaid Other | Admitting: *Deleted

## 2020-12-11 VITALS — BP 118/67 | HR 82

## 2020-12-11 DIAGNOSIS — N898 Other specified noninflammatory disorders of vagina: Secondary | ICD-10-CM | POA: Diagnosis not present

## 2020-12-11 DIAGNOSIS — Z3042 Encounter for surveillance of injectable contraceptive: Secondary | ICD-10-CM

## 2020-12-11 MED ORDER — MEDROXYPROGESTERONE ACETATE 150 MG/ML IM SUSP: 150.0000 mg | INTRAMUSCULAR | 1 refills | Status: DC

## 2020-12-11 MED ORDER — MEDROXYPROGESTERONE ACETATE 150 MG/ML IM SUSP
150.0000 mg | INTRAMUSCULAR | 0 refills | Status: DC
Start: 2020-12-11 — End: 2020-12-11

## 2020-12-11 MED ORDER — MEDROXYPROGESTERONE ACETATE 150 MG/ML IM SUSP
150.0000 mg | Freq: Once | INTRAMUSCULAR | Status: AC
  Administered 2020-12-11: 150 mg via INTRAMUSCULAR

## 2020-12-11 NOTE — Progress Notes (Signed)
Patient is not due for annual until 04/2021. Had postpartum visit. Needs RX Depo for the year. Dr. Donavan Foil messaged.

## 2020-12-11 NOTE — Progress Notes (Signed)
Date last pap: 09/11/19. Last Depo-Provera: 09/19/20. Side Effects if any: NA. Serum HCG indicated? NA. Depo-Provera 150 mg IM given by: RUQ glut by Montez Morita, RNC. Next appointment due 02/26/21-03/12/21.   Odor: No. Fever: No. Pelvic Pain: No.  Itching: No. Dyspareunia: No. Desires GC/CT: Yes.    Thin: Yes.   History of PID: No. Desires HIV,RPR,HbsAG: No.  Thick: No. History of STD: No. Other: NA

## 2020-12-11 NOTE — Progress Notes (Signed)
RX for additional Depo to get through until annual in April per Dr. Donavan Foil verbal order.

## 2020-12-13 LAB — CERVICOVAGINAL ANCILLARY ONLY
Bacterial Vaginitis (gardnerella): NEGATIVE
Candida Glabrata: NEGATIVE
Candida Vaginitis: NEGATIVE
Chlamydia: NEGATIVE
Comment: NEGATIVE
Comment: NEGATIVE
Comment: NEGATIVE
Comment: NEGATIVE
Comment: NEGATIVE
Comment: NORMAL
Neisseria Gonorrhea: NEGATIVE
Trichomonas: NEGATIVE

## 2021-02-25 IMAGING — US US MFM OB DETAIL+14 WK
1 series · 13 of 28 positions shown · non-contrast
Comparison: none

[Series 1: us mfm ob detail+14 wk · 13 of 56 slices shown]
[im 3/56]
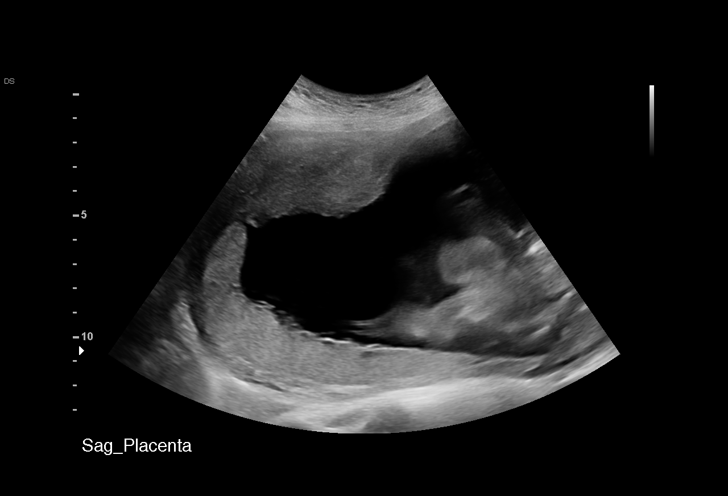
[im 7/56]
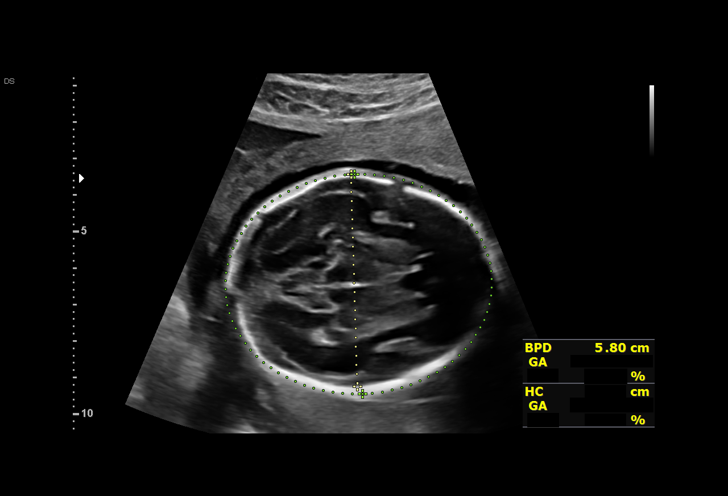
[im 11/56]
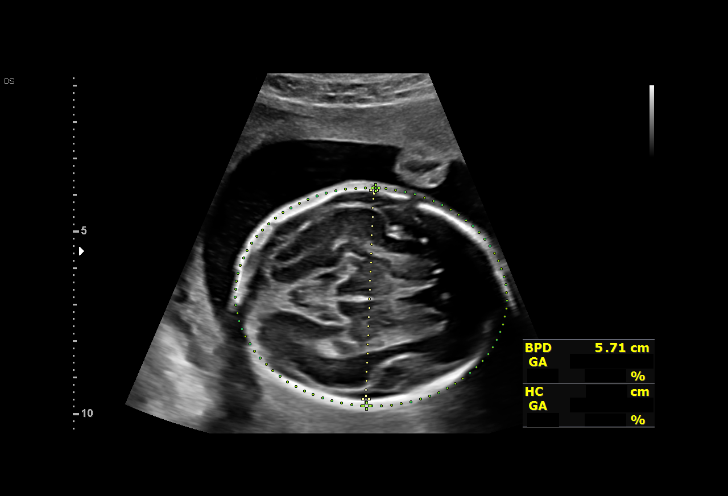
[im 15/56]
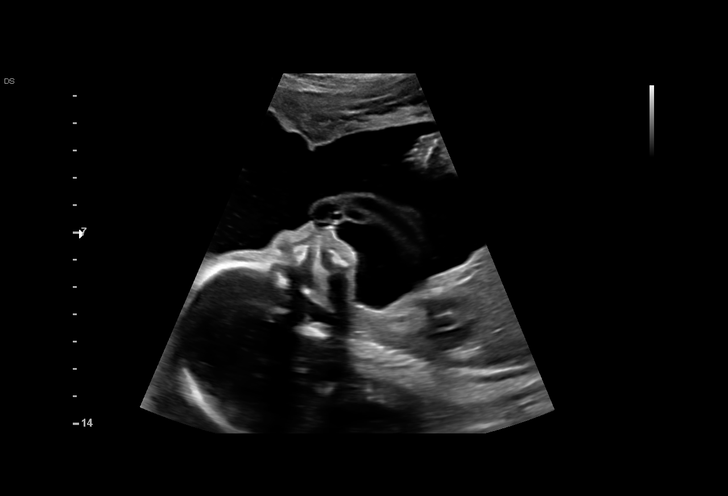
[im 19/56]
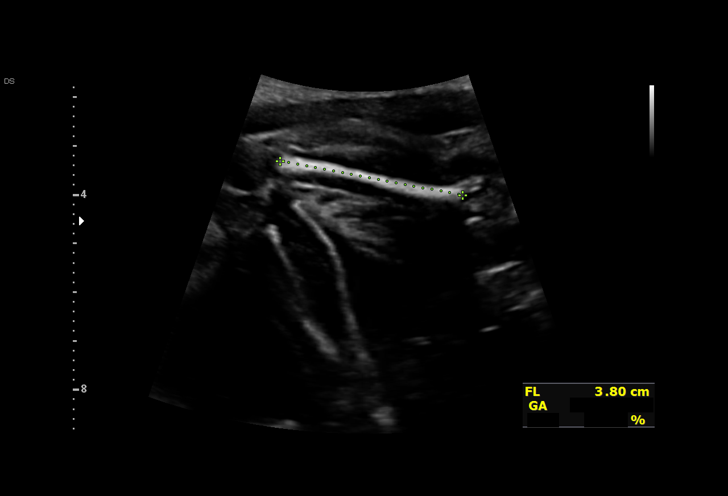
[im 23/56]
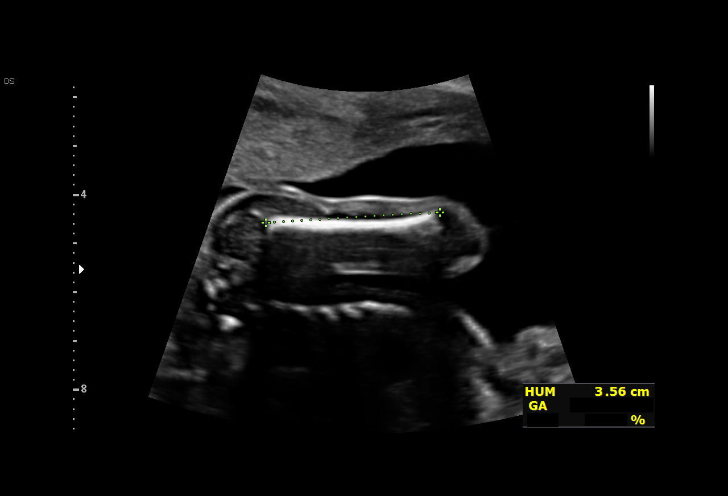
[im 29/56]
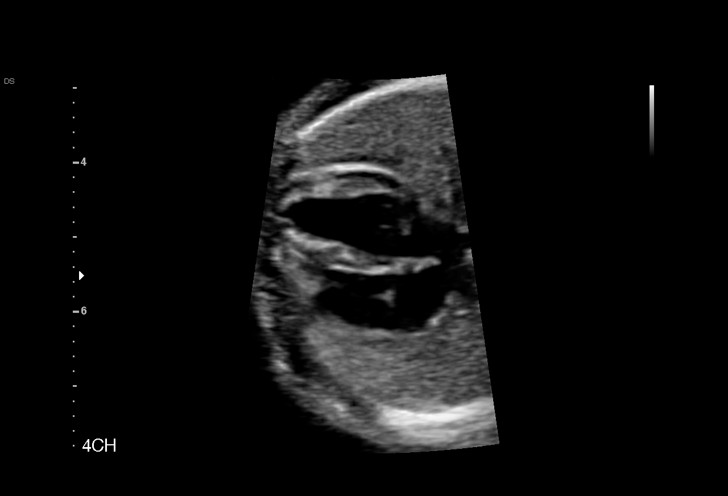
[im 33/56]
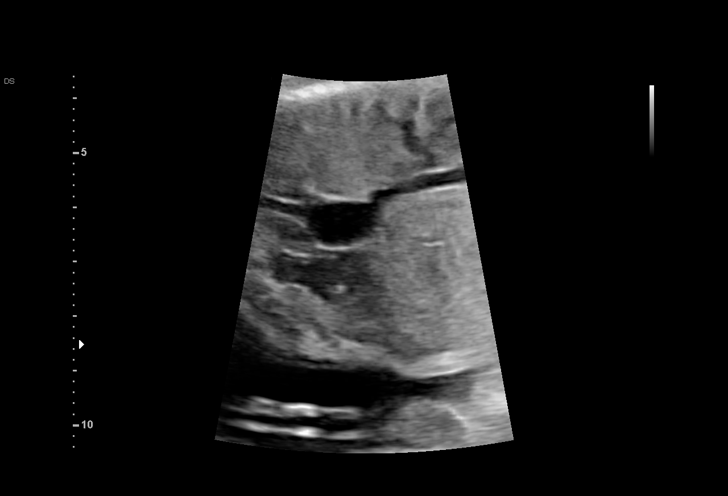
[im 37/56]
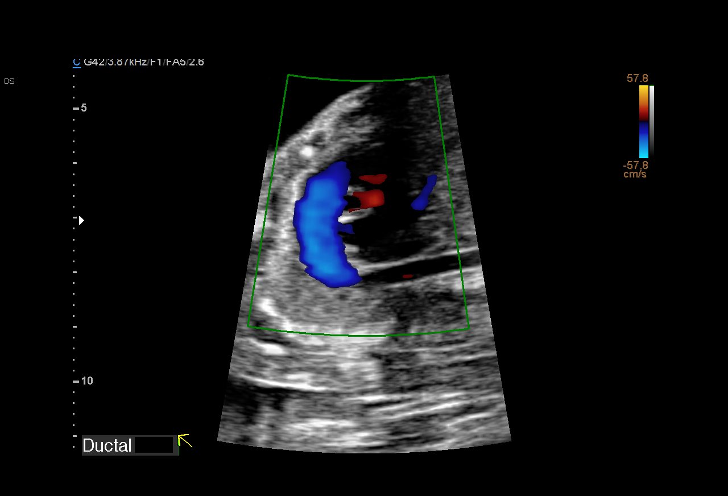
[im 41/56]
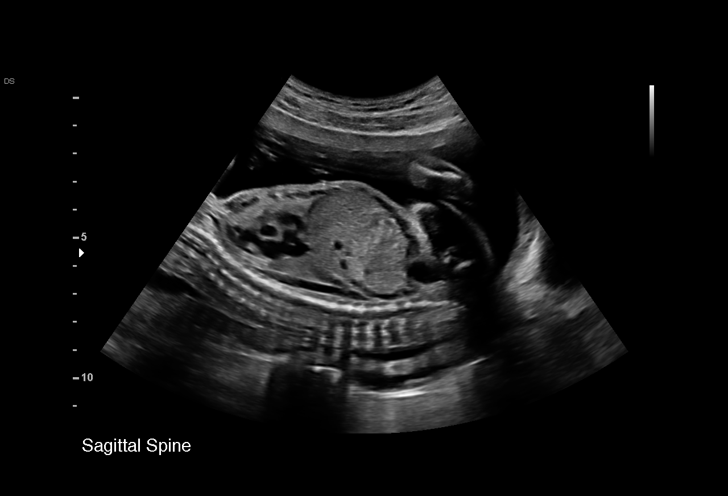
[im 45/56]
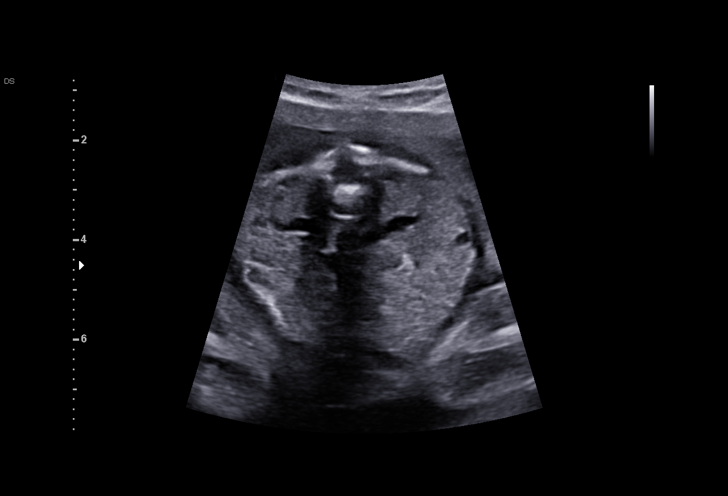
[im 49/56]
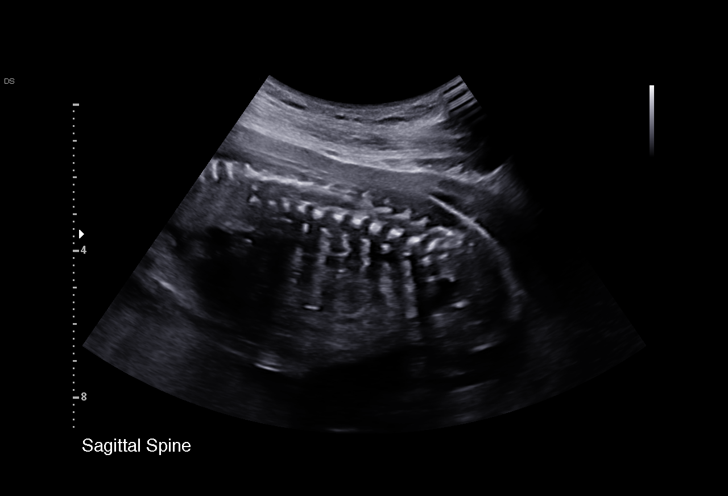
[im 53/56]
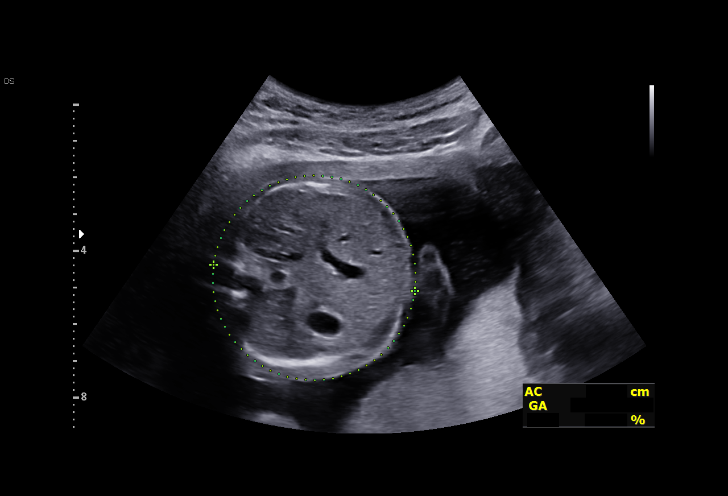

[13 of 28 positions shown; findings below may reference images not displayed]

[REDACTED]care

Indications

 22 weeks gestation of pregnancy
 History of sickle cell trait
 Antenatal screening for malformations
 Previous cesarean delivery, antepartum
Fetal Evaluation

 Num Of Fetuses:         1
 Cardiac Activity:       Observed
 Presentation:           Cephalic
 Placenta:               Posterior Fundal
 P. Cord Insertion:      Visualized, central

 Amniotic Fluid
 AFI FV:      Within normal limits
Biometry

 BPD:      57.9  mm     G. Age:  23w 5d         93  %    CI:        75.63   %    70 - 86
                                                         FL/HC:      18.4   %    18.4 -
 HC:      211.1  mm     G. Age:  23w 1d         79  %    HC/AC:      1.19        1.06 -
 AC:      177.4  mm     G. Age:  22w 4d         58  %    FL/BPD:     67.0   %    71 - 87
 FL:       38.8  mm     G. Age:  22w 3d         50  %    FL/AC:      21.9   %    20 - 24
 HUM:      35.5  mm     G. Age:  22w 2d         51  %
 CER:      23.8  mm     G. Age:  22w 0d         65  %

 CM:        6.8  mm

 Est. FW:     524  gm      1 lb 2 oz     71  %
OB History

 Gravidity:    5         Term:   2         SAB:   2
 Living:       2
Gestational Age

 LMP:           22w 1d        Date:  06/22/19                 EDD:   03/28/20
 U/S Today:     23w 0d                                        EDD:   03/22/20
 Best:          22w 1d     Det. By:  U/S C R L  (08/11/19)    EDD:   03/28/20
Anatomy

 Cranium:               Appears normal         Aortic Arch:            Appears normal
 Cavum:                 Appears normal         Ductal Arch:            Appears normal
 Ventricles:            Appears normal         Diaphragm:              Appears normal
 Choroid Plexus:        Appears normal         Stomach:                Appears normal, left
                                                                       sided
 Cerebellum:            Appears normal         Abdomen:                Appears normal
 Posterior Fossa:       Appears normal         Abdominal Wall:         Appears nml (cord
                                                                       insert, abd wall)
 Nuchal Fold:           Not applicable (>20    Cord Vessels:           Appears normal (3
                        wks GA)                                        vessel cord)
 Lips:                  Appears normal         Kidneys:                Appear normal
 Palate:                Appears normal         Bladder:                Appears normal
 Thoracic:              Appears normal         Spine:                  Appears normal
 Heart:                 Appears normal         Upper Extremities:      Appears normal
                        (4CH, axis, and
                        situs)
 RVOT:                  Appears normal         Lower Extremities:      Appears normal
 LVOT:                  Appears normal

 Other:  Fetus appears to be a male. Nasal bone visualized. Heels/feet and
         open hands/5th digits visualized. VC, 3VV and 3VTV visualized.
Cervix Uterus Adnexa

 Cervix
 Length:           4.09  cm.
 Normal appearance by transabdominal scan.

 Right Ovary
 Within normal limits.

 Left Ovary
 Within normal limits.
Impression

 We performed fetal anatomy scan. No makers of
 aneuploidies or fetal structural defects are seen. Fetal
 biometry is consistent with her previously-established dates.
 Amniotic fluid is normal and good fetal activity is seen.
 Placenta is posterior and there is no previa or accreta.
 Patient understands the limitations of ultrasound in detecting
 fetal anomalies.

 On cell-free fetal DNA screening, the risks of fetal
 aneuploidies are not increased .MSAFP screening showed
 low risk for open-neural tube defects .
Recommendations

 Follow-up scans as clinically indicated.
                 Boldt, Darci

## 2021-03-05 ENCOUNTER — Ambulatory Visit: Payer: Medicaid Other

## 2021-03-11 ENCOUNTER — Ambulatory Visit (INDEPENDENT_AMBULATORY_CARE_PROVIDER_SITE_OTHER): Payer: Medicaid Other

## 2021-03-11 ENCOUNTER — Other Ambulatory Visit: Payer: Self-pay

## 2021-03-11 ENCOUNTER — Other Ambulatory Visit (HOSPITAL_COMMUNITY)
Admission: RE | Admit: 2021-03-11 | Discharge: 2021-03-11 | Disposition: A | Discharge status: Home / Self Care | Payer: Medicaid Other | Source: Ambulatory Visit | Attending: Obstetrics and Gynecology | Admitting: Obstetrics and Gynecology

## 2021-03-11 VITALS — BP 122/74 | HR 92 | Ht 66.0 in | Wt 157.0 lb

## 2021-03-11 DIAGNOSIS — Z3042 Encounter for surveillance of injectable contraceptive: Secondary | ICD-10-CM

## 2021-03-11 DIAGNOSIS — N898 Other specified noninflammatory disorders of vagina: Secondary | ICD-10-CM | POA: Insufficient documentation

## 2021-03-11 MED ORDER — MEDROXYPROGESTERONE ACETATE 150 MG/ML IM SUSP
150.0000 mg | Freq: Once | INTRAMUSCULAR | Status: AC
  Administered 2021-03-11: 150 mg via INTRAMUSCULAR

## 2021-03-11 NOTE — Progress Notes (Addendum)
SUBJECTIVE:  31 y.o. female presents for DEPO Injection.  Complains of malodorous vaginal discharge for 5 day(s). Denies abnormal vaginal bleeding or significant pelvic pain or fever. No UTI symptoms. Denies history of known exposure to STD.  No LMP recorded. Patient has had an injection.  OBJECTIVE:  She appears well, afebrile. Urine dipstick: not done.  ASSESSMENT:  On time for [facility].  Last PAP 09/11/2019 which was abnormal; COLPO results was LGSIL. Patient advised to schedule an appointment for PAP Smear.  Vaginal Discharge  Vaginal Odor   PLAN:  DEPO Injection given in LD, tolerated well. Next Depo due May 9-23/2023.  GC, chlamydia, trichomonas, BVAG, CVAG probe sent to lab. Treatment: To be determined once lab results are received ROV prn if symptoms persist or worsen.   Administrations This Visit     medroxyPROGESTERone (DEPO-PROVERA) injection 150 mg     Admin Date 03/11/2021 Action Given Dose 150 mg Route Intramuscular Administered By Maretta Bees, RMA

## 2021-03-13 LAB — CERVICOVAGINAL ANCILLARY ONLY
Bacterial Vaginitis (gardnerella): NEGATIVE
Candida Glabrata: NEGATIVE
Candida Vaginitis: POSITIVE — AB
Chlamydia: NEGATIVE
Comment: NEGATIVE
Comment: NEGATIVE
Comment: NEGATIVE
Comment: NEGATIVE
Comment: NEGATIVE
Comment: NORMAL
Neisseria Gonorrhea: NEGATIVE
Trichomonas: NEGATIVE

## 2021-03-13 MED ORDER — FLUCONAZOLE 150 MG PO TABS: 150.0000 mg | ORAL_TABLET | Freq: Once | ORAL | 0 refills | Status: AC

## 2021-03-13 NOTE — Addendum Note (Signed)
Addended by: Catalina Antigua on: 03/13/2021 12:08 PM   Modules accepted: Orders

## 2021-03-17 NOTE — Progress Notes (Signed)
Patient was assessed and managed by nursing staff during this encounter. I have reviewed the chart and agree with the documentation and plan. I have also made any necessary editorial changes.  Catalina Antigua, MD 03/17/2021 11:19 AM

## 2021-05-29 ENCOUNTER — Ambulatory Visit: Payer: Medicaid Other

## 2021-06-04 ENCOUNTER — Ambulatory Visit: Payer: Medicaid Other

## 2021-08-12 ENCOUNTER — Ambulatory Visit (INDEPENDENT_AMBULATORY_CARE_PROVIDER_SITE_OTHER): Payer: Medicaid Other | Admitting: *Deleted

## 2021-08-12 ENCOUNTER — Other Ambulatory Visit (HOSPITAL_COMMUNITY)
Admission: RE | Admit: 2021-08-12 | Discharge: 2021-08-12 | Disposition: A | Discharge status: Home / Self Care | Payer: Medicaid Other | Source: Ambulatory Visit | Attending: Obstetrics and Gynecology | Admitting: Obstetrics and Gynecology

## 2021-08-12 DIAGNOSIS — N898 Other specified noninflammatory disorders of vagina: Secondary | ICD-10-CM | POA: Diagnosis present

## 2021-08-12 DIAGNOSIS — Z113 Encounter for screening for infections with a predominantly sexual mode of transmission: Secondary | ICD-10-CM

## 2021-08-12 DIAGNOSIS — Z3042 Encounter for surveillance of injectable contraceptive: Secondary | ICD-10-CM

## 2021-08-12 LAB — POCT URINE PREGNANCY: Preg Test, Ur: NEGATIVE

## 2021-08-12 NOTE — Progress Notes (Signed)
SUBJECTIVE:  31 y.o. female complains of vaginal d/c and need for UPT to restart Depo.  Denies abnormal vaginal bleeding or significant pelvic pain or fever. No UTI symptoms. Denies history of known exposure to STD.   OBJECTIVE:  She appears well, afebrile. Urine dipstick: not done. UPT in office: Negative  ASSESSMENT:  Vaginal Discharge  Vaginal Odor   PLAN:  Return in 2 weeks for UPT/Depo start. GC, chlamydia, trichomonas, BVAG, CVAG probe sent to lab. Treatment: To be determined once lab results are received ROV prn if symptoms persist or worsen. Pt advised she is due for annual exam and pap- last pap abnormal.

## 2021-08-12 NOTE — Progress Notes (Signed)
Patient was assessed and managed by nursing staff during this encounter. I have reviewed the chart and agree with the documentation and plan. I have also made any necessary editorial changes.  Jeremey Bascom A Sherley Mckenney, MD 08/12/2021 3:01 PM   

## 2021-08-13 LAB — CERVICOVAGINAL ANCILLARY ONLY
Bacterial Vaginitis (gardnerella): POSITIVE — AB
Candida Glabrata: NEGATIVE
Candida Vaginitis: NEGATIVE
Chlamydia: NEGATIVE
Comment: NEGATIVE
Comment: NEGATIVE
Comment: NEGATIVE
Comment: NEGATIVE
Comment: NEGATIVE
Comment: NORMAL
Neisseria Gonorrhea: NEGATIVE
Trichomonas: NEGATIVE

## 2021-08-15 ENCOUNTER — Other Ambulatory Visit: Payer: Self-pay

## 2021-08-15 DIAGNOSIS — B9689 Other specified bacterial agents as the cause of diseases classified elsewhere: Secondary | ICD-10-CM

## 2021-08-15 MED ORDER — METRONIDAZOLE 500 MG PO TABS: 500.0000 mg | ORAL_TABLET | Freq: Two times a day (BID) | ORAL | 0 refills | Status: DC

## 2021-08-18 ENCOUNTER — Ambulatory Visit: Payer: Medicaid Other | Admitting: Obstetrics and Gynecology

## 2021-08-26 ENCOUNTER — Ambulatory Visit: Payer: Medicaid Other

## 2021-09-01 ENCOUNTER — Other Ambulatory Visit: Payer: Self-pay | Admitting: *Deleted

## 2021-09-01 DIAGNOSIS — B3731 Acute candidiasis of vulva and vagina: Secondary | ICD-10-CM

## 2021-09-01 DIAGNOSIS — Z3042 Encounter for surveillance of injectable contraceptive: Secondary | ICD-10-CM

## 2021-09-01 MED ORDER — MEDROXYPROGESTERONE ACETATE 150 MG/ML IM SUSP: 150.0000 mg | INTRAMUSCULAR | 0 refills | Status: DC

## 2021-09-01 MED ORDER — FLUCONAZOLE 150 MG PO TABS: 150.0000 mg | ORAL_TABLET | Freq: Once | ORAL | 0 refills | Status: AC

## 2021-09-03 ENCOUNTER — Ambulatory Visit (INDEPENDENT_AMBULATORY_CARE_PROVIDER_SITE_OTHER): Payer: Medicaid Other

## 2021-09-03 VITALS — BP 115/69 | HR 69 | Ht 66.0 in | Wt 163.8 lb

## 2021-09-03 DIAGNOSIS — Z309 Encounter for contraceptive management, unspecified: Secondary | ICD-10-CM | POA: Diagnosis not present

## 2021-09-03 LAB — POCT URINE PREGNANCY: Preg Test, Ur: NEGATIVE

## 2021-09-03 NOTE — Progress Notes (Signed)
[name] presents today for first UPT to restart depo.  LMP: 08/14/21    OBJECTIVE: Appears well, in no apparent distress.  OB History     Gravida  5   Para  3   Term  3   Preterm      AB  2   Living  3      SAB      IAB  2   Ectopic      Multiple  0   Live Births  3          In-Office UPT result: Negative I have reviewed the patient's medical, obstetrical, social, and family histories, and medications.   ASSESSMENT: Negative pregnancy test  PLAN 2nd UPT DEPO injection in 2 weeks  Abstain from sex for 2 weeks Pick up injection from pharmacy

## 2021-09-03 NOTE — Progress Notes (Signed)
.  cwhrn °

## 2021-09-09 ENCOUNTER — Telehealth: Payer: Self-pay | Admitting: *Deleted

## 2021-09-09 NOTE — Telephone Encounter (Signed)
Constant, [name], MD     She has to come in for a self swab      Attempt to contact pt at numbers in chart. No answer, No VM.

## 2021-09-09 NOTE — Telephone Encounter (Signed)
Pt called to office stating she is still having symptoms of BV and yeast.  Pt was just recently tested and  treated for both.  Pt made aware that refill of Rx could not be sent at this time.   Advised msg to be sent to provider for recommendations.   Please review and advise.

## 2021-09-10 ENCOUNTER — Ambulatory Visit (INDEPENDENT_AMBULATORY_CARE_PROVIDER_SITE_OTHER): Payer: Medicaid Other

## 2021-09-10 ENCOUNTER — Other Ambulatory Visit (HOSPITAL_COMMUNITY)
Admission: RE | Admit: 2021-09-10 | Discharge: 2021-09-10 | Disposition: A | Discharge status: Home / Self Care | Payer: Medicaid Other | Source: Ambulatory Visit | Attending: Obstetrics and Gynecology | Admitting: Obstetrics and Gynecology

## 2021-09-10 VITALS — BP 109/69 | HR 69 | Ht 66.0 in | Wt 164.6 lb

## 2021-09-10 DIAGNOSIS — N898 Other specified noninflammatory disorders of vagina: Secondary | ICD-10-CM

## 2021-09-10 MED ORDER — METRONIDAZOLE 500 MG PO TABS: 500.0000 mg | ORAL_TABLET | Freq: Two times a day (BID) | ORAL | 0 refills | Status: DC

## 2021-09-10 NOTE — Progress Notes (Signed)
SUBJECTIVE:  31 y.o. female complains of clear and thin vaginal discharge for 3 day(s). Denies abnormal vaginal bleeding or significant pelvic pain or fever. No UTI symptoms. Denies history of known exposure to STD. Having foul smelling discharge.   Patient's last menstrual period was 08/14/2021 (approximate).  OBJECTIVE:  She appears well, afebrile.   ASSESSMENT:  Vaginal Discharge  Vaginal Odor   PLAN:  GC, chlamydia, trichomonas, BVAG, CVAG probe sent to lab. Treatment: To be determined once lab results are received ROV prn if symptoms persist or worsen.

## 2021-09-12 LAB — CERVICOVAGINAL ANCILLARY ONLY
Bacterial Vaginitis (gardnerella): POSITIVE — AB
Candida Glabrata: NEGATIVE
Candida Vaginitis: NEGATIVE
Chlamydia: NEGATIVE
Comment: NEGATIVE
Comment: NEGATIVE
Comment: NEGATIVE
Comment: NEGATIVE
Comment: NEGATIVE
Comment: NORMAL
Neisseria Gonorrhea: NEGATIVE
Trichomonas: NEGATIVE

## 2021-09-12 NOTE — Progress Notes (Signed)
Patient was assessed and managed by nursing staff during this encounter. I have reviewed the chart and agree with the documentation and plan. I have also made any necessary editorial changes.  Scheryl Darter, MD 09/12/2021 12:12 PM

## 2021-09-17 ENCOUNTER — Ambulatory Visit
Admission: EM | Admit: 2021-09-17 | Discharge: 2021-09-17 | Disposition: A | Discharge status: Home / Self Care | Payer: Medicaid Other | Attending: Emergency Medicine | Admitting: Emergency Medicine

## 2021-09-17 ENCOUNTER — Ambulatory Visit: Payer: Medicaid Other

## 2021-09-17 DIAGNOSIS — S6991XA Unspecified injury of right wrist, hand and finger(s), initial encounter: Secondary | ICD-10-CM

## 2021-09-17 MED ORDER — IBUPROFEN 800 MG PO TABS
800.0000 mg | ORAL_TABLET | Freq: Once | ORAL | Status: AC
  Administered 2021-09-17: 800 mg via ORAL

## 2021-09-17 MED ORDER — IBUPROFEN 600 MG PO TABS: 600.0000 mg | ORAL_TABLET | Freq: Three times a day (TID) | ORAL | 0 refills | Status: DC | PRN

## 2021-09-17 NOTE — ED Provider Notes (Signed)
[facility]    CSN: [id number] Arrival date & time: 09/17/21  1754    HISTORY   Chief Complaint  Patient presents with   Finger Injury   HPI Cheyenne Phillips is a pleasant, 31 y.o. female who presents to urgent care today. Patient reports jamming her right ring finger around 2 AM this morning during work, states she is not exactly sure how it happened.  At this time patient states the finger is throbbing, feels swollen and tight.  Patient states she is not able to flex or extend her finger without pain.  Patient states she is not taking anything to relieve her pain as of yet.  Patient denies prior injury to this finger in the past.  The history is provided by the patient.   Past Medical History:  Diagnosis Date   Medical history non-contributory    Seasonal allergies    Sickle cell trait [facility])    Patient Active Problem List   Diagnosis Date Noted   Encounter for postpartum visit 05/06/2020   Post-dates pregnancy 03/23/2020   Sickle cell trait (HCC) 10/09/2019   ASCUS with positive high risk HPV cervical 09/18/2019   Supervision of other normal pregnancy, antepartum 08/11/2019   History of cesarean delivery 11/09/2018   Past Surgical History:  Procedure Laterality Date   CESAREAN SECTION N/A 05/31/2013   Procedure: CESAREAN SECTION;  Surgeon: Brock Bad, MD;  Location: [facility];  Service: Obstetrics;  Laterality: N/A;   WISDOM TOOTH EXTRACTION     OB History     Gravida  5   Para  3   Term  3   Preterm      AB  2   Living  3      SAB      IAB  2   Ectopic      Multiple  0   Live Births  3          Home Medications    Prior to Admission medications   Medication Sig Start Date End Date Taking? Authorizing Provider  medroxyPROGESTERone (DEPO-PROVERA) 150 MG/ML injection ADMINISTER 1 ML(150 MG) IN THE MUSCLE EVERY 3 MONTHS 09/19/20   Brock Bad, MD  medroxyPROGESTERone (DEPO-PROVERA) 150 MG/ML injection Inject 1 mL (150 mg  total) into the muscle every 3 (three) months. 09/01/21   Constant, [name], MD  metroNIDAZOLE (FLAGYL) 500 MG tablet Take 1 tablet (500 mg total) by mouth 2 (two) times daily. 08/15/21   Warden Fillers, MD  metroNIDAZOLE (FLAGYL) 500 MG tablet Take 1 tablet (500 mg total) by mouth 2 (two) times daily. 09/10/21   Adam Phenix, MD  Prenatal Vit-Fe Fumarate-FA (PRENATAL MULTIVITAMIN) TABS tablet Take 1 tablet by mouth daily at 12 noon. Patient not taking: No sig reported    [provider]    Family History Family History  Problem Relation Age of Onset   Hypertension Maternal Grandmother    Hyperlipidemia Maternal Grandmother    Arthritis Maternal Grandmother    Asthma Maternal Grandmother    Social History Social History   Tobacco Use   Smoking status: Former    Packs/day: 1.00    Types: Cigarettes   Smokeless tobacco: Never  Vaping Use   Vaping Use: Never used  Substance Use Topics   Alcohol use: Not Currently    Alcohol/week: 0.0 standard drinks of alcohol   Drug use: Yes   Allergies   Patient has no known allergies.  Review of Systems Review  of Systems Pertinent findings revealed after performing a 14 point review of systems has been noted in the history of present illness.  Physical Exam Triage Vital Signs ED Triage Vitals  Enc Vitals Group     BP 11/15/20 0827 (!) 147/82     Pulse Rate 11/15/20 0827 72     Resp 11/15/20 0827 18     Temp 11/15/20 0827 98.3 F (36.8 C)     Temp Source 11/15/20 0827 Oral     SpO2 11/15/20 0827 98 %     Weight --      Height --      Head Circumference --      Peak Flow --      Pain Score 11/15/20 0826 5     Pain Loc --      Pain Edu? --      Excl. in GC? --    Updated Vital Signs BP 107/71 (BP Location: Right Arm)   Pulse 69   Temp 97.9 F (36.6 C) (Oral)   Resp 16   LMP 09/16/2021 (Approximate)   SpO2 97%   Breastfeeding No   Physical Exam Vitals and nursing note reviewed.  Constitutional:      General:  She is not in acute distress.    Appearance: Normal appearance.  HENT:     Head: Normocephalic and atraumatic.  Eyes:     Pupils: Pupils are equal, round, and reactive to light.  Cardiovascular:     Rate and Rhythm: Normal rate and regular rhythm.  Pulmonary:     Effort: Pulmonary effort is normal.     Breath sounds: Normal breath sounds.  Musculoskeletal:     Right hand: Swelling, deformity, tenderness and bony tenderness present. Decreased range of motion. Decreased strength of finger abduction and thumb/finger opposition. Normal strength of wrist extension. Normal sensation. There is no disruption of two-point discrimination. Normal capillary refill. Normal pulse.     Left hand: Normal.     Cervical back: Normal range of motion and neck supple.  Skin:    General: Skin is warm and dry.  Neurological:     General: No focal deficit present.     Mental Status: She is alert and oriented to person, place, and time. Mental status is at baseline.  Psychiatric:        Mood and Affect: Mood normal.        Behavior: Behavior normal.        Thought Content: Thought content normal.        Judgment: Judgment normal.     UC Couse / Diagnostics / Procedures:     Radiology No results found.  Procedures Procedures (including critical care time) EKG  Pending results:  Labs Reviewed - No data to display  Medications Ordered in UC: Medications  ibuprofen (ADVIL) tablet 800 mg (has no administration in time range)    UC Diagnoses / Final Clinical Impressions(s)   I have reviewed the triage vital signs and the nursing notes.  Pertinent labs & imaging results that were available during my care of the patient were reviewed by me and considered in my medical decision making (see chart for details).    Final diagnoses:  Injury of finger of right hand, initial encounter   Patient advised to [facility] location to have x-ray done because we do not have an x-ray tech  here at this clinic today.  Patient advised that once I receive the results of  the x-ray I will reach out to her to let her know with the radiology report states.  In the meantime, patient was provided with a splint for her right fourth finger and hand.  Patient was also provided with ibuprofen during her visit today and a prescription for the same.  Return precautions advised.  ED Prescriptions     Medication Sig Dispense Auth. Provider   ibuprofen (ADVIL) 600 MG tablet Take 1 tablet (600 mg total) by mouth every 8 (eight) hours as needed for up to 30 doses for fever, headache, mild pain or moderate pain (Inflammation). Take 1 tablet 3 times daily as needed for inflammation of upper airways and/or pain. 30 tablet Theadora Rama Scales, PA-C      PDMP not reviewed this encounter.  Discharge Instructions:   Discharge Instructions      Please go to [facility] at [facility] to have the x-ray of your right hand performed.  Because of the lateness of the day, I may not receive results from the radiologist until tomorrow morning.  In the meantime, please wear the splint that we provided for you during your visit today.  I also sent a prescription for ibuprofen 600 mg to your pharmacy that you can take 3 times daily as needed for pain.  You may also wish to consider icing your hand frequently to get the swelling down.      Disposition Upon Discharge:  Condition: stable for discharge home Home: take medications as prescribed; routine discharge instructions as discussed; follow up as advised.  Patient presented with an acute illness with associated systemic symptoms and significant discomfort requiring urgent management. In my opinion, this is a condition that a prudent lay person (someone who possesses an average knowledge of health and medicine) may potentially expect to result in complications if not addressed urgently such as respiratory distress, impairment of bodily  function or dysfunction of bodily organs.   Routine symptom specific, illness specific and/or disease specific instructions were discussed with the patient and/or caregiver at length.   As such, the patient has been evaluated and assessed, work-up was performed and treatment was provided in alignment with urgent care protocols and evidence based medicine.  Patient/parent/caregiver has been advised that the patient may require follow up for further testing and treatment if the symptoms continue in spite of treatment, as clinically indicated and appropriate.  Patient/parent/caregiver has been advised to report to orthopedic urgent care clinic or return to the [facility] or PCP in 3-5 days if no better; follow-up with orthopedics, PCP or the Emergency Department if new signs and symptoms develop or if the current signs or symptoms continue to change or worsen for further workup, evaluation and treatment as clinically indicated and appropriate  The patient will follow up with their current PCP if and as advised. If the patient does not currently have a PCP we will have assisted them in obtaining one.   The patient may need specialty follow up if the symptoms continue, in spite of conservative treatment and management, for further workup, evaluation, consultation and treatment as clinically indicated and appropriate.  Patient/parent/caregiver verbalized understanding and agreement of plan as discussed.  All questions were addressed during visit.  Please see discharge instructions below for further details of plan.  This office note has been dictated using Teaching laboratory technician.  Unfortunately, this method of dictation can sometimes lead to typographical or grammatical errors.  I apologize for your inconvenience in advance if this  occurs.  Please do not hesitate to reach out to me if clarification is needed.      Theadora Rama Scales, PA-C 09/17/21 1910

## 2021-09-17 NOTE — Discharge Instructions (Signed)
Please go to [facility] at [facility] to have the x-ray of your right hand performed.  Because of the lateness of the day, I may not receive results from the radiologist until tomorrow morning.  In the meantime, please wear the splint that we provided for you during your visit today.  I also sent a prescription for ibuprofen 600 mg to your pharmacy that you can take 3 times daily as needed for pain.  You may also wish to consider icing your hand frequently to get the swelling down.

## 2021-09-17 NOTE — ED Triage Notes (Signed)
The pt states she hit her right ringer finger and now it is throbbing and feels tight. The finger is swollen.   Started; today  Home interventions: none

## 2021-09-18 ENCOUNTER — Ambulatory Visit (HOSPITAL_BASED_OUTPATIENT_CLINIC_OR_DEPARTMENT_OTHER)
Admission: RE | Admit: 2021-09-18 | Discharge: 2021-09-18 | Disposition: A | Discharge status: Home / Self Care | Payer: Medicaid Other | Source: Ambulatory Visit | Attending: Emergency Medicine | Admitting: Emergency Medicine

## 2021-09-18 DIAGNOSIS — S6991XA Unspecified injury of right wrist, hand and finger(s), initial encounter: Secondary | ICD-10-CM | POA: Diagnosis not present

## 2021-09-26 ENCOUNTER — Encounter: Payer: Self-pay | Admitting: Obstetrics and Gynecology

## 2021-09-26 ENCOUNTER — Other Ambulatory Visit (HOSPITAL_COMMUNITY)
Admission: RE | Admit: 2021-09-26 | Discharge: 2021-09-26 | Disposition: A | Discharge status: Home / Self Care | Payer: Medicaid Other | Source: Ambulatory Visit | Attending: Obstetrics and Gynecology | Admitting: Obstetrics and Gynecology

## 2021-09-26 ENCOUNTER — Ambulatory Visit (INDEPENDENT_AMBULATORY_CARE_PROVIDER_SITE_OTHER): Payer: Medicaid Other | Admitting: Obstetrics and Gynecology

## 2021-09-26 VITALS — BP 112/67 | HR 86 | Wt 156.6 lb

## 2021-09-26 DIAGNOSIS — Z87410 Personal history of cervical dysplasia: Secondary | ICD-10-CM

## 2021-09-26 DIAGNOSIS — Z01419 Encounter for gynecological examination (general) (routine) without abnormal findings: Secondary | ICD-10-CM

## 2021-09-26 DIAGNOSIS — Z113 Encounter for screening for infections with a predominantly sexual mode of transmission: Secondary | ICD-10-CM

## 2021-09-26 DIAGNOSIS — N898 Other specified noninflammatory disorders of vagina: Secondary | ICD-10-CM | POA: Diagnosis not present

## 2021-09-26 NOTE — Progress Notes (Signed)
GYNECOLOGY ANNUAL PREVENTATIVE CARE ENCOUNTER NOTE  History:     Cheyenne Phillips is a 31 y.o. [phone] female here for a routine annual gynecologic exam.  Current complaints: birth control interest.   Denies abnormal vaginal bleeding, discharge, pelvic pain, problems with intercourse or other gynecologic concerns.    Gynecologic History Patient's last menstrual period was 09/16/2021 (approximate). Contraception: Depo-Provera injections Last Pap: 8/21. 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 Last mammogram:n/a  Obstetric History OB History  Gravida Para Term Preterm AB Living  5 3 3   2 3   SAB IAB Ectopic Multiple Live Births    2   0 3    # Outcome Date GA Lbr Len/2nd Weight Sex Delivery Anes PTL Lv  5 Term 03/23/20 [redacted]w[redacted]d 00:56 / 00:03 7 lb 6.5 oz (3.359 kg) M VBAC EPI  LIV  4 IAB 12/12/16          3 IAB 04/11/16          2 Term 11/20/14 [redacted]w[redacted]d 10:08 / 00:19 7 lb 1 oz (3.204 kg) F VBAC EPI  LIV  1 Term 05/31/13 [redacted]w[redacted]d  6 lb 12.8 oz (3.084 kg) F CS-LTranv EPI  LIV    Past Medical History:  Diagnosis Date   Medical history non-contributory    Seasonal allergies    Sickle cell trait (HCC)     Past Surgical History:  Procedure Laterality Date   CESAREAN SECTION N/A 05/31/2013   Procedure: CESAREAN SECTION;  Surgeon: 06/02/2013, MD;  Location: [facility];  Service: Obstetrics;  Laterality: N/A;   WISDOM TOOTH EXTRACTION      Current Outpatient Medications on File Prior to Visit  Medication Sig Dispense Refill   ibuprofen (ADVIL) 600 MG tablet Take 1 tablet (600 mg total) by mouth every 8 (eight) hours as needed for up to 30 doses for fever, headache, mild pain or moderate pain (Inflammation). Take 1 tablet 3 times daily as needed for inflammation of upper airways and/or pain. (Patient not taking: Reported on 09/26/2021) 30 tablet 0   medroxyPROGESTERone (DEPO-PROVERA) 150 MG/ML injection ADMINISTER 1 ML(150 MG) IN THE MUSCLE EVERY 3 MONTHS  (Patient not taking: Reported on 09/26/2021) 1 mL 0   medroxyPROGESTERone (DEPO-PROVERA) 150 MG/ML injection Inject 1 mL (150 mg total) into the muscle every 3 (three) months. (Patient not taking: Reported on 09/26/2021) 1 mL 0   metroNIDAZOLE (FLAGYL) 500 MG tablet Take 1 tablet (500 mg total) by mouth 2 (two) times daily. (Patient not taking: Reported on 09/26/2021) 14 tablet 0   metroNIDAZOLE (FLAGYL) 500 MG tablet Take 1 tablet (500 mg total) by mouth 2 (two) times daily. (Patient not taking: Reported on 09/26/2021) 14 tablet 0   Prenatal Vit-Fe Fumarate-FA (PRENATAL MULTIVITAMIN) TABS tablet Take 1 tablet by mouth daily at 12 noon. (Patient not taking: Reported on 05/06/2020)     No current facility-administered medications on file prior to visit.    No Known Allergies  Social History:  reports that she has quit smoking. Her smoking use included cigarettes. She smoked an average of 1 pack per day. She has never used smokeless tobacco. She reports that she does not currently use alcohol. She reports current drug use.  Family History  Problem Relation Age of Onset   Hypertension Maternal Grandmother    Hyperlipidemia Maternal Grandmother    Arthritis Maternal Grandmother    Asthma Maternal Grandmother     The following portions of the patient's  history were reviewed and updated as appropriate: allergies, current medications, past family history, past medical history, past social history, past surgical history and problem list.  Review of Systems Pertinent items noted in HPI and remainder of comprehensive ROS otherwise negative.  Physical Exam:  BP 112/67   Pulse 86   Wt 156 lb 9.6 oz (71 kg)   LMP 09/16/2021 (Approximate)   BMI 25.28 kg/m  CONSTITUTIONAL: Well-developed, well-nourished female in no acute distress.  HENT:  Normocephalic, atraumatic, External right and left ear normal. Oropharynx is clear and moist EYES: Conjunctivae and EOM are normal. Pupils are equal, round, and  reactive to light. No scleral icterus.  NECK: Normal range of motion, supple, no masses.  Normal thyroid.  SKIN: Skin is warm and dry. No rash noted. Not diaphoretic. No erythema. No pallor. MUSCULOSKELETAL: Normal range of motion. No tenderness.  No cyanosis, clubbing, or edema.  2+ distal pulses. NEUROLOGIC: Alert and oriented to person, place, and time. Normal reflexes, muscle tone coordination.  PSYCHIATRIC: Normal mood and affect. Normal behavior. Normal judgment and thought content. CARDIOVASCULAR: Normal heart rate noted, regular rhythm RESPIRATORY: Clear to auscultation bilaterally. Effort and breath sounds normal, no problems with respiration noted. BREASTS: deferred ABDOMEN: Soft, no distention noted.  No tenderness, rebound or guarding.  PELVIC: Normal appearing external genitalia and urethral meatus; normal appearing vaginal mucosa and cervix.  No abnormal discharge noted.  Pap smear obtained. Vaginal swab obtained. Normal uterine size, no other palpable masses, no uterine or adnexal tenderness.  Performed in the presence of a chaperone.   Assessment and Plan:    1. Women's annual routine gynecological examination Normal annual exam, Colpo with CIN 1 in 2022, pap obtained for review Information given on IUD and nexplanon, pt is having some difficulty getting in for her depo provera injections - Cytology - PAP( [place])  2. Routine screening for STI (sexually transmitted infection) Per pt request - Cervicovaginal ancillary only( [place]) - Hepatitis B surface antigen - Hepatitis C antibody - RPR - HIV Antibody (routine testing w rflx)  3. Vaginal irritation Vaginal swab pending  4. History of cervical dysplasia  - Cytology - PAP( [place])  Will follow up results of pap smear and manage accordingly. Routine preventative health maintenance measures emphasized. Please refer to After Visit Summary for other counseling recommendations.      Mariel Aloe, MD,  FACOG Obstetrician & Gynecologist, [facility], [facility]

## 2021-09-26 NOTE — Progress Notes (Signed)
Pt is in the office for annual Last pap 09/11/2019 Reports vaginal irritation and discharge, desires std testing Wants to discuss [facility] options LMP 09/16/21

## 2021-09-27 LAB — RPR: RPR Ser Ql: NONREACTIVE

## 2021-09-27 LAB — HIV ANTIBODY (ROUTINE TESTING W REFLEX): HIV Screen 4th Generation wRfx: NONREACTIVE

## 2021-09-27 LAB — HEPATITIS B SURFACE ANTIGEN: Hepatitis B Surface Ag: NEGATIVE

## 2021-09-27 LAB — HEPATITIS C ANTIBODY: Hep C Virus Ab: NONREACTIVE

## 2021-09-29 LAB — CERVICOVAGINAL ANCILLARY ONLY
Bacterial Vaginitis (gardnerella): POSITIVE — AB
Candida Glabrata: NEGATIVE
Candida Vaginitis: NEGATIVE
Chlamydia: NEGATIVE
Comment: NEGATIVE
Comment: NEGATIVE
Comment: NEGATIVE
Comment: NEGATIVE
Comment: NEGATIVE
Comment: NORMAL
Neisseria Gonorrhea: NEGATIVE
Trichomonas: NEGATIVE

## 2021-09-30 ENCOUNTER — Telehealth: Payer: Self-pay | Admitting: Emergency Medicine

## 2021-09-30 ENCOUNTER — Encounter: Payer: Self-pay | Admitting: Emergency Medicine

## 2021-09-30 DIAGNOSIS — B9689 Other specified bacterial agents as the cause of diseases classified elsewhere: Secondary | ICD-10-CM

## 2021-09-30 MED ORDER — METRONIDAZOLE 500 MG PO TABS: 500.0000 mg | ORAL_TABLET | Freq: Two times a day (BID) | ORAL | 0 refills | Status: DC

## 2021-09-30 NOTE — Telephone Encounter (Signed)
Attempted TC, unable to leave voicemail. Rx sent to pharmacy.  MyChart message sent.

## 2021-10-02 LAB — CYTOLOGY - PAP
Comment: NEGATIVE
Diagnosis: NEGATIVE
Diagnosis: REACTIVE
High risk HPV: NEGATIVE

## 2021-10-17 ENCOUNTER — Telehealth: Payer: Self-pay

## 2021-10-17 ENCOUNTER — Other Ambulatory Visit: Payer: Self-pay

## 2021-10-17 DIAGNOSIS — B9689 Other specified bacterial agents as the cause of diseases classified elsewhere: Secondary | ICD-10-CM

## 2021-10-17 MED ORDER — METRONIDAZOLE 500 MG PO TABS: 500.0000 mg | ORAL_TABLET | Freq: Two times a day (BID) | ORAL | 0 refills | Status: DC

## 2021-10-17 NOTE — Telephone Encounter (Signed)
Pt called stating she has an odor after having her cycle. Denies being sexually active. Pt requesting refill on flagyl.  Rx sent.

## 2021-11-03 ENCOUNTER — Ambulatory Visit: Payer: Medicaid Other

## 2021-11-27 ENCOUNTER — Other Ambulatory Visit (HOSPITAL_COMMUNITY)
Admission: RE | Admit: 2021-11-27 | Discharge: 2021-11-27 | Disposition: A | Discharge status: Home / Self Care | Payer: Medicaid Other | Source: Ambulatory Visit | Attending: Obstetrics | Admitting: Obstetrics

## 2021-11-27 ENCOUNTER — Ambulatory Visit: Payer: Medicaid Other | Admitting: Obstetrics

## 2021-11-27 ENCOUNTER — Encounter: Payer: Self-pay | Admitting: Obstetrics

## 2021-11-27 VITALS — BP 126/78 | HR 75 | Wt 152.9 lb

## 2021-11-27 DIAGNOSIS — Z113 Encounter for screening for infections with a predominantly sexual mode of transmission: Secondary | ICD-10-CM

## 2021-11-27 DIAGNOSIS — N898 Other specified noninflammatory disorders of vagina: Secondary | ICD-10-CM | POA: Insufficient documentation

## 2021-11-27 DIAGNOSIS — N9412 Deep dyspareunia: Secondary | ICD-10-CM

## 2021-11-27 NOTE — Progress Notes (Signed)
Patient ID: Cheyenne Phillips, female   DOB: 02/14/1990, 31 y.o.   MRN: [id number]  Chief Complaint  Patient presents with   Vaginal Pain    HPI Cheyenne Phillips is a 31 y.o. female.  Complains of vaginal dryness and painful intercourse. HPI  Past Medical History:  Diagnosis Date   Medical history non-contributory    Seasonal allergies    Sickle cell trait (HCC)     Past Surgical History:  Procedure Laterality Date   CESAREAN SECTION N/A 05/31/2013   Procedure: CESAREAN SECTION;  Surgeon: Brock Bad, MD;  Location: [facility];  Service: Obstetrics;  Laterality: N/A;   WISDOM TOOTH EXTRACTION      Family History  Problem Relation Age of Onset   Hypertension Maternal Grandmother    Hyperlipidemia Maternal Grandmother    Arthritis Maternal Grandmother    Asthma Maternal Grandmother     Social History Social History   Tobacco Use   Smoking status: Former    Packs/day: 1.00    Types: Cigarettes   Smokeless tobacco: Never  Vaping Use   Vaping Use: Never used  Substance Use Topics   Alcohol use: Not Currently    Alcohol/week: 0.0 standard drinks of alcohol   Drug use: Yes    No Known Allergies  Current Outpatient Medications  Medication Sig Dispense Refill   ibuprofen (ADVIL) 600 MG tablet Take 1 tablet (600 mg total) by mouth every 8 (eight) hours as needed for up to 30 doses for fever, headache, mild pain or moderate pain (Inflammation). Take 1 tablet 3 times daily as needed for inflammation of upper airways and/or pain. (Patient not taking: Reported on 09/26/2021) 30 tablet 0   medroxyPROGESTERone (DEPO-PROVERA) 150 MG/ML injection ADMINISTER 1 ML(150 MG) IN THE MUSCLE EVERY 3 MONTHS (Patient not taking: Reported on 09/26/2021) 1 mL 0   medroxyPROGESTERone (DEPO-PROVERA) 150 MG/ML injection Inject 1 mL (150 mg total) into the muscle every 3 (three) months. (Patient not taking: Reported on 09/26/2021) 1 mL 0   metroNIDAZOLE (FLAGYL) 500 MG tablet Take 1 tablet (500 mg  total) by mouth 2 (two) times daily. (Patient not taking: Reported on 09/26/2021) 14 tablet 0   metroNIDAZOLE (FLAGYL) 500 MG tablet Take 1 tablet (500 mg total) by mouth 2 (two) times daily. (Patient not taking: Reported on 09/26/2021) 14 tablet 0   metroNIDAZOLE (FLAGYL) 500 MG tablet Take 1 tablet (500 mg total) by mouth 2 (two) times daily. (Patient not taking: Reported on 11/27/2021) 14 tablet 0   Prenatal Vit-Fe Fumarate-FA (PRENATAL MULTIVITAMIN) TABS tablet Take 1 tablet by mouth daily at 12 noon. (Patient not taking: Reported on 05/06/2020)     No current facility-administered medications for this visit.    Review of Systems Review of Systems Constitutional: negative for fatigue and weight loss Respiratory: negative for cough and wheezing Cardiovascular: negative for chest pain, fatigue and palpitations Gastrointestinal: negative for abdominal pain and change in bowel habits Genitourinary:positive for vaginal dryness and painful intercourse Integument/breast: negative for nipple discharge Musculoskeletal:negative for myalgias Neurological: negative for gait problems and tremors Behavioral/Psych: negative for abusive relationship, depression Endocrine: negative for temperature intolerance      Blood pressure 126/78, pulse 75, weight 152 lb 14.4 oz (69.4 kg), not currently breastfeeding.  Physical Exam Physical Exam General:   Alert and no distress  Skin:   no rash or abnormalities  Lungs:   clear to auscultation bilaterally  Heart:   regular rate and rhythm, S1, S2 normal, no murmur, click,  rub or gallop  Breasts:   Not examined  Abdomen:  normal findings: no organomegaly, soft, non-tender and no hernia  Pelvis:  External genitalia: normal general appearance Urinary system: urethral meatus normal and bladder without fullness, nontender Vaginal: normal without tenderness, induration or masses Cervix: normal appearance Adnexa: normal bimanual exam Uterus: anteverted and  non-tender, normal size    I have spent a total of 20 minutes of face-to-face time, excluding clinical staff time, reviewing notes and preparing to see patient, ordering tests and/or medications, and counseling the patient.   Data Reviewed Wet Prep  Assessment     1. Vaginal dryness  2. Vaginal discharge Rx: - Cervicovaginal ancillary only  3. Deep dyspareunia  4. Screening examination for STD (sexually transmitted disease) Rx: - Hepatitis B surface antigen - Hepatitis C antibody - RPR - HIV Antibody (routine testing w rflx)     Plan   Follow up in 1 week  Orders Placed This Encounter  Procedures   Hepatitis B surface antigen   Hepatitis C antibody   RPR   HIV Antibody (routine testing w rflx)     Brock Bad, MD 11/27/2021 5:00 PM

## 2021-11-27 NOTE — Progress Notes (Signed)
Pt presents for vaginal dryness and "discomfort" with intercourse.  Negative pap 09/26/21 Pt wants all STD testing.

## 2021-11-28 LAB — HIV ANTIBODY (ROUTINE TESTING W REFLEX): HIV Screen 4th Generation wRfx: NONREACTIVE

## 2021-11-28 LAB — HEPATITIS C ANTIBODY: Hep C Virus Ab: NONREACTIVE

## 2021-11-28 LAB — HEPATITIS B SURFACE ANTIGEN: Hepatitis B Surface Ag: NEGATIVE

## 2021-11-28 LAB — RPR: RPR Ser Ql: NONREACTIVE

## 2021-12-01 LAB — CERVICOVAGINAL ANCILLARY ONLY
Bacterial Vaginitis (gardnerella): NEGATIVE
Candida Glabrata: NEGATIVE
Candida Vaginitis: POSITIVE — AB
Chlamydia: NEGATIVE
Comment: NEGATIVE
Comment: NEGATIVE
Comment: NEGATIVE
Comment: NEGATIVE
Comment: NEGATIVE
Comment: NORMAL
Neisseria Gonorrhea: NEGATIVE
Trichomonas: NEGATIVE

## 2021-12-02 ENCOUNTER — Other Ambulatory Visit: Payer: Self-pay | Admitting: Obstetrics

## 2021-12-02 DIAGNOSIS — B379 Candidiasis, unspecified: Secondary | ICD-10-CM

## 2021-12-02 MED ORDER — FLUCONAZOLE 150 MG PO TABS: 150.0000 mg | ORAL_TABLET | Freq: Once | ORAL | 0 refills | Status: AC

## 2021-12-02 NOTE — Progress Notes (Signed)
TC to pt. No answer. Calls cannot be completed to either number on file. Message sent via MyChart including pt eduction.

## 2021-12-18 ENCOUNTER — Ambulatory Visit: Payer: Medicaid Other

## 2021-12-19 ENCOUNTER — Encounter: Payer: Self-pay | Admitting: Emergency Medicine

## 2021-12-19 ENCOUNTER — Other Ambulatory Visit: Payer: Self-pay | Admitting: Emergency Medicine

## 2021-12-19 MED ORDER — METRONIDAZOLE 500 MG PO TABS: 500.0000 mg | ORAL_TABLET | Freq: Two times a day (BID) | ORAL | 0 refills | Status: DC

## 2021-12-19 NOTE — Progress Notes (Signed)
Rx for BV 

## 2022-01-05 ENCOUNTER — Ambulatory Visit: Payer: Medicaid Other

## 2022-01-06 ENCOUNTER — Ambulatory Visit (INDEPENDENT_AMBULATORY_CARE_PROVIDER_SITE_OTHER): Payer: Medicaid Other | Admitting: *Deleted

## 2022-01-06 VITALS — BP 119/76 | HR 75

## 2022-01-06 DIAGNOSIS — Z3201 Encounter for pregnancy test, result positive: Secondary | ICD-10-CM | POA: Diagnosis not present

## 2022-01-06 DIAGNOSIS — Z3200 Encounter for pregnancy test, result unknown: Secondary | ICD-10-CM

## 2022-01-06 LAB — POCT URINE PREGNANCY: Preg Test, Ur: POSITIVE — AB

## 2022-01-06 NOTE — Progress Notes (Signed)
Cheyenne Phillips presents today for UPT. She has no unusual complaints. LMP:   12/03/21 OBJECTIVE: Appears well, in no apparent distress.  OB History     Gravida  5   Para  3   Term  3   Preterm      AB  2   Living  3      SAB      IAB  2   Ectopic      Multiple  0   Live Births  3          Home UPT Result: positive In-Office UPT result:positive I have reviewed the patient's medical, obstetrical, social, and family histories, and medications.   ASSESSMENT: Positive pregnancy test  PLAN Patient does not intend to continue pregnancy.

## 2022-02-10 ENCOUNTER — Ambulatory Visit (INDEPENDENT_AMBULATORY_CARE_PROVIDER_SITE_OTHER): Payer: Medicaid Other | Admitting: Obstetrics & Gynecology

## 2022-02-10 ENCOUNTER — Encounter: Payer: Self-pay | Admitting: Obstetrics & Gynecology

## 2022-02-10 ENCOUNTER — Other Ambulatory Visit (HOSPITAL_COMMUNITY)
Admission: RE | Admit: 2022-02-10 | Discharge: 2022-02-10 | Disposition: A | Discharge status: Home / Self Care | Payer: Medicaid Other | Source: Ambulatory Visit | Attending: Obstetrics & Gynecology | Admitting: Obstetrics & Gynecology

## 2022-02-10 VITALS — BP 110/77 | HR 82 | Ht 68.0 in | Wt 152.0 lb

## 2022-02-10 DIAGNOSIS — Z309 Encounter for contraceptive management, unspecified: Secondary | ICD-10-CM

## 2022-02-10 DIAGNOSIS — O0480 (Induced) termination of pregnancy with unspecified complications: Secondary | ICD-10-CM

## 2022-02-10 DIAGNOSIS — Z3201 Encounter for pregnancy test, result positive: Secondary | ICD-10-CM | POA: Diagnosis not present

## 2022-02-10 DIAGNOSIS — Z3A01 Less than 8 weeks gestation of pregnancy: Secondary | ICD-10-CM | POA: Diagnosis not present

## 2022-02-10 DIAGNOSIS — Z113 Encounter for screening for infections with a predominantly sexual mode of transmission: Secondary | ICD-10-CM | POA: Insufficient documentation

## 2022-02-10 DIAGNOSIS — O0489 (Induced) termination of pregnancy with other complications: Secondary | ICD-10-CM | POA: Diagnosis not present

## 2022-02-10 LAB — POCT URINE PREGNANCY: Preg Test, Ur: POSITIVE — AB

## 2022-02-10 NOTE — Progress Notes (Signed)
TAB 01/16/22, intermittent spotting and episodes of small amt of bleeding, some red, some brown Wants STI vaginal swab Wants to restart depo Wants to discuss vaginal dryness and wants hormone levels tested. Reports OTC vaginal lubricants cause BV and yeast for her  Positive UPT/ Recent TAB. Provider ordered bHCG. See provider's notes.

## 2022-02-10 NOTE — Progress Notes (Signed)
Subjective:s/p TAB 12/29     Cheyenne Phillips is a 32 y.o. female who presents to the clinic 4 weeks status post  TAB  for  early twin gestation . Eating a regular diet without difficulty. Bowel movements are normal. The patient is not having any pain.  The following portions of the patient's history were reviewed and updated as appropriate: allergies, current medications, past family history, past medical history, past social history, past surgical history, and problem list.  Review of Systems Spotting and vaginal dryness.   She wants to restart DMPA, have estrogen level checked Objective:    BP 110/77   Pulse 82   Ht 5\' 8"  (1.727 m)   Wt 152 lb (68.9 kg)   LMP 12/03/2021 (Exact Date)   Breastfeeding Unknown   BMI 23.11 kg/m  General:  alert, cooperative, and no distress  Abdomen: Not distended  Incision:        Assessment:    Postoperative course complicated by positive UPT    Plan:    1. Continue any current medications. 2. Wound care discussed. 3. Activity restrictions:  abstinence 4. Anticipated return to work: now. 5. Follow up: 1 week for repeat HCG Orders Placed This Encounter  Procedures   Estrogens, Total   Beta hCG quant (ref lab)   POCT urine pregnancy    . Patient ID: Cheyenne Phillips, female   DOB: [birth year], 32 y.o.   MRN: [id number]

## 2022-02-11 LAB — CERVICOVAGINAL ANCILLARY ONLY
Bacterial Vaginitis (gardnerella): NEGATIVE
Candida Glabrata: NEGATIVE
Candida Vaginitis: NEGATIVE
Chlamydia: NEGATIVE
Comment: NEGATIVE
Comment: NEGATIVE
Comment: NEGATIVE
Comment: NEGATIVE
Comment: NEGATIVE
Comment: NORMAL
Neisseria Gonorrhea: NEGATIVE
Trichomonas: NEGATIVE

## 2022-02-13 ENCOUNTER — Other Ambulatory Visit: Payer: Medicaid Other

## 2022-02-14 LAB — BETA HCG QUANT (REF LAB): hCG Quant: 1162 m[IU]/mL

## 2022-02-14 LAB — ESTROGENS, TOTAL: Estrogen: 137 pg/mL

## 2022-02-16 ENCOUNTER — Other Ambulatory Visit: Payer: Medicaid Other

## 2022-02-16 ENCOUNTER — Ambulatory Visit: Payer: Medicaid Other | Admitting: Obstetrics & Gynecology

## 2022-02-16 VITALS — BP 109/66 | HR 74 | Ht 68.0 in | Wt 154.4 lb

## 2022-02-16 DIAGNOSIS — Z3200 Encounter for pregnancy test, result unknown: Secondary | ICD-10-CM

## 2022-02-16 NOTE — Progress Notes (Signed)
Follow up positive UPT and bHCG following TAB 1 month ago. Consulted with Dr. Roselie Awkward. Plan to [facility] as nurse visit and then follow up once resulted. Pt denies vaginal bleeding, abdominal pain or other urgent symptoms. Denies pregnancy symptoms.  Patient was assessed and managed by nursing staff during this encounter. I have reviewed the chart and agree with the documentation and plan. I have also made any necessary editorial changes.  Emeterio Reeve, MD 02/20/2022 11:59 AM

## 2022-02-17 LAB — BETA HCG QUANT (REF LAB): hCG Quant: 317 m[IU]/mL

## 2022-03-03 ENCOUNTER — Encounter: Payer: Self-pay | Admitting: Obstetrics and Gynecology

## 2022-03-03 ENCOUNTER — Ambulatory Visit: Payer: Medicaid Other | Admitting: Obstetrics and Gynecology

## 2022-03-03 VITALS — BP 121/68 | HR 72 | Ht 68.0 in | Wt 156.0 lb

## 2022-03-03 DIAGNOSIS — O039 Complete or unspecified spontaneous abortion without complication: Secondary | ICD-10-CM | POA: Diagnosis not present

## 2022-03-03 DIAGNOSIS — Z3A00 Weeks of gestation of pregnancy not specified: Secondary | ICD-10-CM | POA: Diagnosis not present

## 2022-03-03 DIAGNOSIS — Z09 Encounter for follow-up examination after completed treatment for conditions other than malignant neoplasm: Secondary | ICD-10-CM | POA: Diagnosis not present

## 2022-03-03 MED ORDER — MEDROXYPROGESTERONE ACETATE 150 MG/ML IM SUSP: 150.0000 mg | INTRAMUSCULAR | 5 refills | Status: DC

## 2022-03-03 NOTE — Progress Notes (Signed)
32 yo P3 here for follow up on recent termination of pregnancy. Patient terminated a twin pregnancy in mid December 2023. She reports feeling well and is without any complaints. She has been abstinent and desires to restart depo-provera when menses start. She has not had a period yet. Patient is without any other concerns  Past Medical History:  Diagnosis Date   Medical history non-contributory    Seasonal allergies    Sickle cell trait ([place])    Past Surgical History:  Procedure Laterality Date   CESAREAN SECTION N/A 05/31/2013   Procedure: CESAREAN SECTION;  Surgeon: Shelly Bombard, MD;  Location: [facility];  Service: Obstetrics;  Laterality: N/A;   WISDOM TOOTH EXTRACTION     Family History  Problem Relation Age of Onset   Hypertension Maternal Grandmother    Hyperlipidemia Maternal Grandmother    Arthritis Maternal Grandmother    Asthma Maternal Grandmother    Social History   Tobacco Use   Smoking status: Former    Packs/day: 1.00    Types: Cigarettes   Smokeless tobacco: Never  Vaping Use   Vaping Use: Never used  Substance Use Topics   Alcohol use: Not Currently    Alcohol/week: 0.0 standard drinks of alcohol   Drug use: Yes   ROS See pertinent in HPI. All other systems reviewed and non contributory Blood pressure 121/68, pulse 72, height 5' 8"$  (1.727 m), weight 156 lb (70.8 kg), not currently breastfeeding. GENERAL: Well-developed, well-nourished female in no acute distress.  ABDOMEN: Soft, nontender, nondistended. No organomegaly. NEURO: alert and oriented x 3   A/P 32 yo here for follow up on termination of pregnancy - quant HCG today - Rx depo-provera provided - Patient plans to return to start depo-provera upon onset of menses - Patient will be contacted with results

## 2022-03-03 NOTE — Progress Notes (Signed)
Pt presents for IAB f/u. Pt reports lower abdominal pressure 1 week ago. No period since IAB. No other concerns.

## 2022-03-04 LAB — BETA HCG QUANT (REF LAB): hCG Quant: 13 m[IU]/mL

## 2022-03-10 ENCOUNTER — Telehealth: Payer: Self-pay | Admitting: Emergency Medicine

## 2022-03-10 NOTE — Telephone Encounter (Signed)
-----   Message from Mora Bellman, MD sent at 03/09/2022 11:11 AM EST ----- Please inform patient of decreasing quant HCG consistent with resolving pregnancy. She may start contraception if desired. I suspect that a home pregnancy test this week will be negative

## 2022-03-10 NOTE — Telephone Encounter (Signed)
Attempted TC to patient, LVM.

## 2022-03-20 DEATH — deceased
